# Patient Record
Sex: Female | Born: 1940 | Race: White | Hispanic: No | Marital: Married | Smoking: Never smoker
Health system: Southern US, Community
[De-identification: ages and names within clinical notes are randomized; demographics above are authoritative.]

## PROBLEM LIST (undated history)

## (undated) DIAGNOSIS — E7800 Pure hypercholesterolemia, unspecified: Secondary | ICD-10-CM

## (undated) DIAGNOSIS — F0280 Dementia in other diseases classified elsewhere without behavioral disturbance: Secondary | ICD-10-CM

## (undated) DIAGNOSIS — G43909 Migraine, unspecified, not intractable, without status migrainosus: Secondary | ICD-10-CM

## (undated) DIAGNOSIS — M810 Age-related osteoporosis without current pathological fracture: Secondary | ICD-10-CM

## (undated) DIAGNOSIS — E559 Vitamin D deficiency, unspecified: Secondary | ICD-10-CM

## (undated) DIAGNOSIS — F419 Anxiety disorder, unspecified: Secondary | ICD-10-CM

## (undated) DIAGNOSIS — K219 Gastro-esophageal reflux disease without esophagitis: Secondary | ICD-10-CM

## (undated) DIAGNOSIS — F329 Major depressive disorder, single episode, unspecified: Secondary | ICD-10-CM

## (undated) DIAGNOSIS — E222 Syndrome of inappropriate secretion of antidiuretic hormone: Secondary | ICD-10-CM

## (undated) DIAGNOSIS — E871 Hypo-osmolality and hyponatremia: Secondary | ICD-10-CM

## (undated) DIAGNOSIS — I10 Essential (primary) hypertension: Secondary | ICD-10-CM

## (undated) DIAGNOSIS — F0390 Unspecified dementia without behavioral disturbance: Secondary | ICD-10-CM

## (undated) DIAGNOSIS — F32A Depression, unspecified: Secondary | ICD-10-CM

## (undated) DIAGNOSIS — G309 Alzheimer's disease, unspecified: Secondary | ICD-10-CM

## (undated) DIAGNOSIS — G459 Transient cerebral ischemic attack, unspecified: Secondary | ICD-10-CM

## (undated) HISTORY — DX: Depression, unspecified: F32.A

## (undated) HISTORY — DX: Syndrome of inappropriate secretion of antidiuretic hormone: E22.2

## (undated) HISTORY — DX: Hypo-osmolality and hyponatremia: E87.1

## (undated) HISTORY — DX: Migraine, unspecified, not intractable, without status migrainosus: G43.909

## (undated) HISTORY — DX: Anxiety disorder, unspecified: F41.9

## (undated) HISTORY — DX: Age-related osteoporosis without current pathological fracture: M81.0

## (undated) HISTORY — DX: Gastro-esophageal reflux disease without esophagitis: K21.9

## (undated) HISTORY — DX: Major depressive disorder, single episode, unspecified: F32.9

## (undated) HISTORY — DX: Vitamin D deficiency, unspecified: E55.9

## (undated) HISTORY — DX: Pure hypercholesterolemia, unspecified: E78.00

## (undated) HISTORY — DX: Transient cerebral ischemic attack, unspecified: G45.9

## (undated) HISTORY — DX: Unspecified dementia, unspecified severity, without behavioral disturbance, psychotic disturbance, mood disturbance, and anxiety: F03.90

---

## 1998-08-25 ENCOUNTER — Other Ambulatory Visit: Admission: RE | Admit: 1998-08-25 | Discharge: 1998-08-25 | Payer: Self-pay | Admitting: Obstetrics and Gynecology

## 1999-09-29 ENCOUNTER — Other Ambulatory Visit: Admission: RE | Admit: 1999-09-29 | Discharge: 1999-09-29 | Payer: Self-pay | Admitting: Obstetrics and Gynecology

## 2000-10-03 ENCOUNTER — Other Ambulatory Visit: Admission: RE | Admit: 2000-10-03 | Discharge: 2000-10-03 | Payer: Self-pay | Admitting: Obstetrics and Gynecology

## 2000-12-29 ENCOUNTER — Encounter: Payer: Self-pay | Admitting: Internal Medicine

## 2000-12-29 ENCOUNTER — Ambulatory Visit (HOSPITAL_COMMUNITY): Admission: RE | Admit: 2000-12-29 | Discharge: 2000-12-29 | Payer: Self-pay | Admitting: Internal Medicine

## 2002-01-09 ENCOUNTER — Other Ambulatory Visit: Admission: RE | Admit: 2002-01-09 | Discharge: 2002-01-09 | Payer: Self-pay | Admitting: Obstetrics and Gynecology

## 2002-01-17 HISTORY — PX: SHOULDER SURGERY: SHX246

## 2002-03-12 ENCOUNTER — Emergency Department (HOSPITAL_COMMUNITY): Admission: EM | Admit: 2002-03-12 | Discharge: 2002-03-12 | Payer: Self-pay | Admitting: Emergency Medicine

## 2002-03-12 ENCOUNTER — Encounter: Payer: Self-pay | Admitting: Emergency Medicine

## 2002-04-02 ENCOUNTER — Encounter: Payer: Self-pay | Admitting: Neurology

## 2002-04-02 ENCOUNTER — Ambulatory Visit (HOSPITAL_COMMUNITY): Admission: RE | Admit: 2002-04-02 | Discharge: 2002-04-02 | Payer: Self-pay | Admitting: Neurology

## 2002-07-16 ENCOUNTER — Inpatient Hospital Stay (HOSPITAL_COMMUNITY): Admission: RE | Admit: 2002-07-16 | Discharge: 2002-07-18 | Payer: Self-pay | Admitting: Orthopedic Surgery

## 2002-12-09 ENCOUNTER — Emergency Department (HOSPITAL_COMMUNITY): Admission: EM | Admit: 2002-12-09 | Discharge: 2002-12-09 | Payer: Self-pay | Admitting: Emergency Medicine

## 2005-08-14 ENCOUNTER — Emergency Department (HOSPITAL_COMMUNITY): Admission: EM | Admit: 2005-08-14 | Discharge: 2005-08-14 | Payer: Self-pay | Admitting: Emergency Medicine

## 2005-11-01 ENCOUNTER — Inpatient Hospital Stay (HOSPITAL_COMMUNITY): Admission: EM | Admit: 2005-11-01 | Discharge: 2005-11-04 | Payer: Self-pay | Admitting: Emergency Medicine

## 2005-11-02 ENCOUNTER — Encounter (INDEPENDENT_AMBULATORY_CARE_PROVIDER_SITE_OTHER): Payer: Self-pay | Admitting: Interventional Cardiology

## 2007-01-05 ENCOUNTER — Emergency Department (HOSPITAL_COMMUNITY): Admission: EM | Admit: 2007-01-05 | Discharge: 2007-01-06 | Payer: Self-pay | Admitting: Emergency Medicine

## 2007-05-01 ENCOUNTER — Ambulatory Visit: Payer: Self-pay | Admitting: Internal Medicine

## 2007-05-01 DIAGNOSIS — J040 Acute laryngitis: Secondary | ICD-10-CM | POA: Insufficient documentation

## 2007-05-01 DIAGNOSIS — H6980 Other specified disorders of Eustachian tube, unspecified ear: Secondary | ICD-10-CM | POA: Insufficient documentation

## 2007-05-01 DIAGNOSIS — J309 Allergic rhinitis, unspecified: Secondary | ICD-10-CM | POA: Insufficient documentation

## 2007-05-17 DIAGNOSIS — I10 Essential (primary) hypertension: Secondary | ICD-10-CM | POA: Insufficient documentation

## 2007-05-17 DIAGNOSIS — E119 Type 2 diabetes mellitus without complications: Secondary | ICD-10-CM | POA: Insufficient documentation

## 2007-12-23 ENCOUNTER — Emergency Department (HOSPITAL_COMMUNITY): Admission: EM | Admit: 2007-12-23 | Discharge: 2007-12-23 | Payer: Self-pay | Admitting: Emergency Medicine

## 2008-01-16 ENCOUNTER — Encounter: Payer: Self-pay | Admitting: Internal Medicine

## 2008-02-06 ENCOUNTER — Encounter: Payer: Self-pay | Admitting: Internal Medicine

## 2008-02-08 ENCOUNTER — Telehealth (INDEPENDENT_AMBULATORY_CARE_PROVIDER_SITE_OTHER): Payer: Self-pay | Admitting: *Deleted

## 2008-02-21 ENCOUNTER — Ambulatory Visit: Payer: Self-pay | Admitting: Internal Medicine

## 2008-02-21 DIAGNOSIS — L299 Pruritus, unspecified: Secondary | ICD-10-CM | POA: Insufficient documentation

## 2008-02-21 LAB — CONVERTED CEMR LAB
Basophils Absolute: 0 10*3/uL (ref 0.0–0.1)
Basophils Relative: 0 % (ref 0.0–3.0)
CRP, High Sensitivity: <1 — ABNORMAL LOW (ref 0.00–5.00)
Eosinophils Absolute: 0.6 10*3/uL (ref 0.0–0.7)
Eosinophils Relative: 12.8 % — ABNORMAL HIGH (ref 0.0–5.0)
HCT: 35.2 % — ABNORMAL LOW (ref 36.0–46.0)
Hemoglobin: 12.1 g/dL (ref 12.0–15.0)
Lymphocytes Relative: 25.8 % (ref 12.0–46.0)
MCHC: 34.4 g/dL (ref 30.0–36.0)
MCV: 90.1 fL (ref 78.0–100.0)
Monocytes Absolute: 0.5 10*3/uL (ref 0.1–1.0)
Monocytes Relative: 12.1 % — ABNORMAL HIGH (ref 3.0–12.0)
Neutro Abs: 2.1 10*3/uL (ref 1.4–7.7)
Neutrophils Relative %: 49.3 % (ref 43.0–77.0)
Platelets: 254 10*3/uL (ref 150–400)
RBC: 3.91 M/uL (ref 3.87–5.11)
RDW: 12.1 % (ref 11.5–14.6)
Sed Rate: 23 mm/hr — ABNORMAL HIGH (ref 0–22)
WBC: 4.3 10*3/uL — ABNORMAL LOW (ref 4.5–10.5)

## 2008-03-12 ENCOUNTER — Encounter: Payer: Self-pay | Admitting: Internal Medicine

## 2008-03-13 ENCOUNTER — Ambulatory Visit: Payer: Self-pay | Admitting: Internal Medicine

## 2008-03-13 DIAGNOSIS — D721 Eosinophilia, unspecified: Secondary | ICD-10-CM | POA: Insufficient documentation

## 2008-03-13 LAB — CONVERTED CEMR LAB
ALT: 25 U/L
AST: 23 U/L
Albumin: 3.9 g/dL
Alkaline Phosphatase: 52 U/L
Basophils Absolute: 0.1 K/uL
Basophils Relative: 1.2 %
Bilirubin, Direct: 0.2 mg/dL
Eosinophils Absolute: 0.4 K/uL
Eosinophils Relative: 8.1 % — ABNORMAL HIGH
HCT: 35.6 % — ABNORMAL LOW
Hemoglobin: 12.3 g/dL
Lymphocytes Relative: 24 %
MCHC: 34.6 g/dL
MCV: 91.2 fL
Monocytes Absolute: 0.4 K/uL
Monocytes Relative: 8.2 %
Neutro Abs: 2.5 K/uL
Neutrophils Relative %: 58.5 %
Platelets: 216 K/uL
RBC: 3.9 M/uL
RDW: 12.4 %
Total Bilirubin: 1 mg/dL
Total Protein: 7.1 g/dL
WBC: 4.5 10*3/microliter

## 2008-03-14 ENCOUNTER — Encounter: Payer: Self-pay | Admitting: Internal Medicine

## 2008-03-14 LAB — CONVERTED CEMR LAB
Anti Nuclear Antibody(ANA): NEGATIVE
IgE (Immunoglobulin E), Serum: 2285.2 intl units/mL — ABNORMAL HIGH (ref 0.0–180.0)

## 2008-04-14 ENCOUNTER — Ambulatory Visit: Payer: Self-pay | Admitting: Internal Medicine

## 2008-10-06 ENCOUNTER — Encounter: Admission: RE | Admit: 2008-10-06 | Discharge: 2008-10-30 | Payer: Self-pay | Admitting: Internal Medicine

## 2009-08-19 ENCOUNTER — Encounter: Admission: RE | Admit: 2009-08-19 | Discharge: 2009-08-19 | Payer: Self-pay | Admitting: Internal Medicine

## 2009-10-13 ENCOUNTER — Encounter: Admission: RE | Admit: 2009-10-13 | Discharge: 2009-10-13 | Payer: Self-pay | Admitting: Internal Medicine

## 2010-02-18 NOTE — Letter (Signed)
[facility]   Imported By: Lanelle Bal 02/21/2008 08:14:58  _____________________________________________________________________  External Attachment:    Type:   Image     Comment:   External Document

## 2010-02-18 NOTE — Progress Notes (Signed)
Summary: skin allergies  Phone Note Call from Patient   Caller: Patient Call For: young Summary of Call: pt has appt to see dr young 2/4 for skin allergies, but wants to come in next week as she feels like her skin is "crawling". no rash. pt's cell [phone] Initial call taken by: Tivis Ringer,  February 08, 2008 10:50 AM  Follow-up for Phone Call        spoke with patient and apologized for her wait.  pt states that her skin still feels like it's crawling.  pt is frustrated that she has not been able to be worked in to see CY, especially since her sister was able to be seen today by him.  she states that this was never an issue "with the old office."  i apologized again for the inconvenience and told the patient that if it worsened over the weekend to go to the ER or UC, and that with the weather we already have some openings on CY's schedule for Monday.  pt denied that option due to the pending weather.  stated that she will see [place] on Thursday. Follow-up by: Boone Master CNA,  February 14, 2008 5:23 PM

## 2010-02-18 NOTE — Letter (Signed)
[facility]   Imported By: Autumn Schneider 02/21/2008 08:16:35  _____________________________________________________________________  External Attachment:    Type:   Image     Comment:   External Document

## 2010-02-18 NOTE — Assessment & Plan Note (Signed)
Summary: 1 month/cb   Primary Laverna Dossett/Referring Kaylub Detienne:  Merri Brunette  CC:  1 month followup.  Pt still c/o itching.  Also c/o sinus drainage in throat x 4 wks..  History of Present Illness: 02/21/08- Allergic rhintis, pruritus, ? sinusitis She comes now with concern of generalized itching. The only rash has been with excoriation at left axiilla, but no hives, joint inflammation, fever or sweat. Legs swell dependently. Weight has gone up. She mentions new well on her property and water has seemed "dirty"- due to be checked. Itching may have begun around time Norvasc was increased to two times a day and seemed to improve initially with dose reduction. Now off Norvasc x 5 weeks, switched to Bystolic, but she is still scratching at herself in bed at night. Denies fever, sweat, hives, adenopathy, hot joints.  Having sores in nose, yellow nasal discharge with some blood. slight headache. Not much ear discomfort.Denies chest tightness or wheeze. Using an oatmeal lotion to soothe. She uses Freight forwarder. Tried vistaril, benadryl, tranxene- make her drowsy enough to sleep through night better. Dr Michaelle Copas labs showed eosinphilia 9.10, with normal LFT.  03/13/18- Allergic rhinitis, pruritus/ rash, / sinusitis Saw Dr Katrinka Blazing, found rash under arm, sent her to Derm- had skin biospy. Also addressed insomnia with fatigue Rx'd doxepin. Did make her sleep. Skin path pending. Much less itching- medication benfit or weather warming?? Lab- Eos 9.1% by Dr Katrinka Blazing in Jan, 12.8% here, ESR 23, CRP<1. Some persistent sinus congestion, some epistaxis.Chest feels fine.No GI discomfort. Denies hx of worm/parasite infection.  04/14/08- Allergic rhinitis, pruritus/ rash, sinusitis Derm told her itching/ rash was eczema- now better except backs of legs still itch some. Has  begun noting red spots on cheeks and coments siter had rosacea. Also asks if Actos could  cause rash and tiredness. Says she needs to  slow down and that she is a light short sleeper, walking the floor at night. Goes to her primary tomorrow.  Postnasal drip sometimes blood tinged- asks for antibiotic for possible sinus infection.      Current Medications (verified): 1)  Lipitor 40 Mg  Tabs (Atorvastatin Calcium) .... Once Daily 2)  Glipizide Xl 10 Mg  Tb24 (Glipizide) .... Once Daily 3)  Protonix 40 Mg  Tbec (Pantoprazole Sodium) .... Once Daily 4)  Actos 30 Mg  Tabs (Pioglitazone Hcl) .... Once Daily 5)  Enalapril Maleate 20 Mg  Tabs (Enalapril Maleate) .... Once Daily 6)  Adult Aspirin Low Strength 81 Mg  Tbdp (Aspirin) .... Once Daily 7)  Claritin 10 Mg  Tabs (Loratadine) .... Once Daily 8)  Fosamax 70 Mg  Tabs (Alendronate Sodium) .... Every Week 9)  Caltrate 600 1500 Mg  Tabs (Calcium Carbonate) .... 2 By Mouth Once Daily 10)  Centrum Silver   Tabs (Multiple Vitamins-Minerals) .... Once Daily 11)  Bystolic 10 Mg Tabs (Nebivolol Hcl) .... Take 1 By Mouth Once Daily 12)  Doxepin Hcl 10 Mg Caps (Doxepin Hcl) .... Use As Directed  Allergies (verified): 1)  ! * Mycins  Past History:  Family History:    mother died emphysema    sister- breast ca (05/01/2007)  Social History:    Patient never smoked.     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  Past Medical History:    Reviewed history from 03/13/2008 and no changes required:    HYPERTENSION (ICD-401.9)    DIABETES, TYPE 2 (ICD-250.00)    LARYNGITIS, ACUTE (ICD-464.00)    EUSTACHIAN TUBE  DYSFUNCTION (ICD-381.81)    ALLERGIC RHINITIS (ICD-477.9)    pneumonia x 1    _Pneumonia vac x 1    Hypereosinophilia  Past Surgical History:    Reviewed history from 05/01/2007 and no changes required:    c-section    shoulder surgery  Family History:    Reviewed history from 05/01/2007 and no changes required:       mother died emphysema       sister- breast ca  Social History:    Reviewed history from 05/01/2007 and no changes required:       Patient never smoked.        married       has twin sister       works schools  Review of Systems      See HPI  The patient denies fever, weight loss, weight gain, hoarseness, syncope, peripheral edema, headaches, hemoptysis, abdominal pain, melena, hematochezia, and severe indigestion/heartburn.    Vital Signs:  Patient profile:   70 year old female Height:      59 inches Weight:      160.25 pounds BMI:     32.48 Pulse rate:   70 / minute BP sitting:   136 / 72  (left arm)  Vitals Entered By: Vernie Murders (April 14, 2008 9:45 AM)  O2 Sat on room air at rest %:  95  Physical Exam  Additional Exam:  General: A/Ox3; pleasant and cooperative, NAD,stocky build SKIN: no rash, lesions. Skin is moistened by lotion, 2 small acneiform lesions on cheks. NODES: no lymphadenopathy HEENT: [place]/AT, EOM- WNL, Conjuctivae- clear, not injected, PERRLA, TM-WNL, Nose- clear, Throat- clear and wnl, Melampatti IV, no postnasal drip visible. NECK: Supple w/ fair ROM, JVD- none, normal carotid impulses w/o bruits Thyroid- normal to palpation CHEST: Clear to P&A HEART: RRR, no m/g/r heard ABDOMEN: Soft VFI:EPPI, nl pulses, no edema  NEURO: Grossly intact to observation      Pulmonary Function Test Height (in.): 59 Gender: Female  Impression & Recommendations:  Problem # 1:  ALLERGIC RHINITIS (ICD-477.9)  Postnasal drip, question of eczema per dermatologist. Question of early rosacea. Will give the zpak she asks for. Early spring  pollen. symptoms. She sits at computer for work which may keep itiching on backs of thighs.  Her updated medication list for this problem includes:    Claritin 10 Mg Tabs (Loratadine) ..... Once daily  Complete Medication List: 1)  Lipitor 40 Mg Tabs (Atorvastatin calcium) .... Once daily 2)  Glipizide Xl 10 Mg Tb24 (Glipizide) .... Once daily 3)  Protonix 40 Mg Tbec (Pantoprazole sodium) .... Once daily 4)  Actos 30 Mg Tabs (Pioglitazone hcl) .... Once daily 5)  Enalapril Maleate 20 Mg Tabs (Enalapril maleate) .... Once daily 6)  Adult Aspirin Low Strength 81 Mg Tbdp (Aspirin) .... Once daily 7)  Claritin 10 Mg Tabs (Loratadine) .... Once  daily 8)  Fosamax 70 Mg Tabs (Alendronate sodium) .... Every week 9)  Caltrate 600 1500 Mg Tabs (Calcium carbonate) .... 2 by mouth once daily 10)  Centrum Silver Tabs (Multiple vitamins-minerals) .... Once daily 11)  Bystolic 10 Mg Tabs (Nebivolol hcl) .... Take 1 by mouth once daily 12)  Doxepin Hcl 10 Mg Caps (Doxepin hcl) .... Use as directed 13)  Zithromax Z-pak 250 Mg Tabs (Azithromycin) .... 2 today then one daily  Other Orders: Est. Patient Level II (16109)  Patient Instructions: 1)  Please schedule a follow-up appointment in 6 months. 2)  Script for Z pak at drug store 3)  Call as needed. Prescriptions: ZITHROMAX Z-PAK 250 MG TABS (AZITHROMYCIN) 2 today then one daily  #1 pak x 0   Entered and Authorized by:   Waymon Budge MD   Signed by:   Waymon Budge MD on 04/14/2008   Method used:   Electronically to        [facility]  [address] # [id number]* (retail)       [address]       [place], [place]  [address]       Ph: [phone] or [phone]       Fax: [phone]   RxID:   [id number]

## 2010-02-18 NOTE — Assessment & Plan Note (Signed)
Summary: allergy flare up/ mbw   Visit Type:  IME Initial PCP:  Merri Brunette  Chief Complaint:  ALLERGY FLARE UP-OLD PT AT [place] CHEST.  History of Present Illness: This is the first visit for a 70 year old woman self-referred for help with seasonal allergies.  She had been followed years ago at the old office.  Her twin sister is a patient here.  She complains of winter and spring nasal congestion.  She also gets postnasal drainage, sore throats, and ear pressure.  She has to sit up on pillows at night.  She has felt acutely ill with similar complaints this weekend.  She usually does not get purulent discharge or wheezing.  She has never had previous allergy vaccine.  I don't have old records to know just what workup was done years ago.     Current Allergies (reviewed today): ! * MYCINS  Past Medical History:    Reviewed history and no changes required:       pneumonia x 1       _Pneumonia vac x 1       Diabetes, Type 2       Hypertension  Past Surgical History:    Reviewed history and no changes required:       c-section       shoulder surgery   Family History:    Reviewed history and no changes required:       mother died emphysema       sister- breast ca  Social History:    Reviewed history and no changes required:       Patient never smoked.        married       has twin sister       works schools   Risk Factors:  Tobacco use:  never   Review of Systems      See HPI       indigestion, sore throats, earaches   Vital Signs:  Patient Profile:   70 Years Old Female Weight:      152.50 pounds O2 Sat:      99 % O2 treatment:    Room Air Pulse rate:   65 / minute BP sitting:   124 / 66  (left arm) Cuff size:   regular  Vitals Entered By: Reynaldo Minium CMA (May 01, 2007 3:57 PM)             Comments Medications reviewed with patient  ..................................................................Marland KitchenReynaldo Minium CMA  May 01, 2007 3:57 PM       Physical Exam  General:     normal appearance and healthy appearing.   Head:     normocephalic and atraumatic Eyes:     PERRLA/EOM intact; conjunctiva and sclera clear Ears:     TMs intact and clear with normal canals Nose:     no deformity, discharge, inflammation, or lesions Mouth:     Melampatti Class III.   porcine Neck:     no JVD.   Lungs:     chest clear to percussion and auscultation Heart:     regular rate and rhythm, S1, S2 without murmurs, rubs, gallops, or clicks Cervical Nodes:     no significant adenopathy Axillary Nodes:     no significant adenopathy     Impression & Recommendations:  Problem # 1:  ALLERGIC RHINITIS (ICD-477.9) acute exacerbation of allergic rhinitis.  I doubt that there is a significant infection now, but we have discussed treatment options.  We  were to give her inhalation treatment and cortisone shot as discussed.  This used to work quite well for her at the old office. Her updated medication list for this problem includes:    Claritin 10 Mg Tabs (Loratadine) ..... Once daily  Orders:  Admin of Therapeutic Inj  intramuscular or subcutaneous (04540) Depo- Medrol 80mg  (J1040) Nebulizer Tx (98119)   Problem # 2:  EUSTACHIAN TUBE DYSFUNCTION (ICD-381.81) mild eustachian tube dysfunction should clear with the treatment outlined above. Orders:    Problem # 3:  LARYNGITIS, ACUTE (ICD-464.00)  Orders: New Patient Level III (14782)   Medications Added to Medication List This Visit: 1)  Lipitor 40 Mg Tabs (Atorvastatin calcium) .... Once daily 2)  Glipizide Xl 10 Mg Tb24 (Glipizide) .... Once daily 3)  Protonix 40 Mg Tbec (Pantoprazole sodium) .... Once daily 4)  Actos 30 Mg Tabs (Pioglitazone hcl) .... Once daily 5)  Enalapril Maleate 20 Mg Tabs (Enalapril maleate) .... Once daily 6)  Adult Aspirin Low Strength 81 Mg Tbdp (Aspirin) .... Once daily 7)  Claritin 10 Mg Tabs (Loratadine) .... Once daily 8)  Vitamin D 95621 Unit  Caps (Ergocalciferol) .... Take 1 by mouth every week 9)  Fosamax 70 Mg Tabs (Alendronate sodium) .... Every week 10)  Caltrate 600 1500 Mg Tabs (Calcium carbonate) .... 2 by mouth once daily 11)  Centrum Silver Tabs (Multiple vitamins-minerals) .... Once daily 12)  Zithromax Z-pak 250 Mg Tabs (Azithromycin) .... 2 today then one daily   Patient Instructions: 1)  Please schedule a follow-up appointment as needed 2)  Neb neo nasal 3)  depo 80 4)  z pak    Prescriptions: ZITHROMAX Z-PAK 250 MG  TABS (AZITHROMYCIN) 2 today then one daily  #1 pak x 0   Entered and Authorized by:   Waymon Budge MD   Signed by:   Waymon Budge MD on 05/01/2007   Method used:   Electronically sent to ...       [facility]  [address] [id number]*       [address]       [place], [place]  [address]       Ph: [phone] or [phone]       Fax: [phone]   RxID:   [id number]  ]  Medication Administration  Injection # 1:    Medication: Depo- Medrol 80mg     Diagnosis: ALLERGIC RHINITIS (ICD-477.9)    Route: IM    Site: L deltoid    Exp Date: 05/2008    Lot #: [id number]    Mfr: Sicor    Given by: Reynaldo Minium CMA (May 01, 2007 4:29 PM)  Medication # 1:    Medication: EMR miscellaneous medications    Diagnosis: ALLERGIC RHINITIS (ICD-477.9)    Dose: 3 drops    Route: intranasal    Exp Date: 11/2008    Lot #: [id number]    Mfr: bayer    Comments: Neo-Synephrine    Given by: Reynaldo Minium CMA (May 01, 2007 4:29 PM)  Orders Added: 1)  New Patient Level III [99203] 2)  Admin of Therapeutic Inj  intramuscular or subcutaneous [96372] 3)  Depo- Medrol 80mg  [J1040] 4)  Nebulizer Tx [01601]

## 2010-02-18 NOTE — Assessment & Plan Note (Signed)
Summary: skin allergies///kp   Visit Type:  Follow-up PCP:  Autumn Schneider  Chief Complaint:  increased itching-getting worse; "bacteria in blood".  History of Present Illness: Current Problems:  HYPERTENSION (ICD-401.9) DIABETES, TYPE 2 (ICD-250.00) LARYNGITIS, ACUTE (ICD-464.00) EUSTACHIAN TUBE DYSFUNCTION (ICD-381.81) ALLERGIC RHINITIS (ICD-477.9)  05/01/07- This is the first visit for a 70 year old woman self-referred for help with seasonal allergies.  She had been followed years ago at the old office.  Her twin sister is a patient here.  She complains of winter and spring nasal congestion.  She also gets postnasal drainage, sore throats, and ear pressure.  She has to sit up on pillows at night.  She has felt acutely ill with similar complaints this weekend.  She usually does not get purulent discharge or wheezing.  She has never had previous allergy vaccine.  I don't have old records to know just what workup was done years ago.  02/21/08- Allergic rhintis, pruritus, ? sinusitis She comes now with concern of generalized itching. The only rash has been with excoriation at left axiilla, but no hives, joint inflammation, fever or sweat. Legs swell dependently. Weight has gone up. She mentions new well on her property and water has seemed "dirty"- due to be checked. Itching may have begun around time Norvasc was increased to two times a day and seemed to improve initially with dose reduction. Now off Norvasc x 5 weeks, switched to Bystolic, but she is still scratching at herself in bed at night. Denies fever, sweat, hives, adenopathy, hot joints.  Having sores in nose, yellow nasal discharge with some blood. slight headache. Not much ear discomfort.Denies chest tightness or wheeze. Using an oatmeal lotion to soothe. She uses Freight forwarder. Tried vistaril, benadryl, tranxene- make her drowsy enough to sleep through night better. Dr Michaelle Copas labs showed eosinphilia 9.10, with  normal LFT.         Prior Medications Reviewed Using: Patient Recall  Updated Prior Medication List: LIPITOR 40 MG  TABS (ATORVASTATIN CALCIUM) once daily GLIPIZIDE XL 10 MG  TB24 (GLIPIZIDE) once daily PROTONIX 40 MG  TBEC (PANTOPRAZOLE SODIUM) once daily ACTOS 30 MG  TABS (PIOGLITAZONE HCL) once daily ENALAPRIL MALEATE 20 MG  TABS (ENALAPRIL MALEATE) once daily ADULT ASPIRIN LOW STRENGTH 81 MG  TBDP (ASPIRIN) once daily CLARITIN 10 MG  TABS (LORATADINE) once daily FOSAMAX 70 MG  TABS (ALENDRONATE SODIUM) every week CALTRATE 600 1500 MG  TABS (CALCIUM CARBONATE) 2 by mouth once daily CENTRUM SILVER   TABS (MULTIPLE VITAMINS-MINERALS) once daily NORVASC 5 MG TABS (AMLODIPINE BESYLATE) take 1 by mouth once daily BYSTOLIC 10 MG TABS (NEBIVOLOL HCL) take 1 by mouth once daily  Current Allergies (reviewed today): ! [employer] Bible Past Medical History:    Reviewed history from 06/01/2007 and no changes required:       HYPERTENSION (ICD-401.9)       DIABETES, TYPE 2 (ICD-250.00)       LARYNGITIS, ACUTE (ICD-464.00)       EUSTACHIAN TUBE DYSFUNCTION (ICD-381.81)       ALLERGIC RHINITIS (ICD-477.9)       pneumonia x 1       _Pneumonia vac x 1                Past Surgical History:    Reviewed history from 05/01/2007 and no changes required:       c-section       shoulder surgery  Family History:    Reviewed history from 05/01/2007 and  no changes required:       mother died emphysema       sister- breast ca  Social History:    Reviewed history from 05/01/2007 and no changes required:       Patient never smoked.        married       has twin sister       works schools   Review of Systems      See HPI  The patient denies anorexia, fever, weight loss, decreased hearing, hoarseness, chest pain, abdominal pain, melena, hematochezia, hematuria, incontinence, abnormal bleeding, and enlarged lymph nodes.    Vital Signs:  Patient Profile:   70 Years Old Female Weight:       160.13 pounds O2 Sat:      98 % O2 treatment:    Room Air Pulse rate:   58 / minute BP sitting:   140 / 84  (left arm) Cuff size:   regular  Vitals Entered By: Reynaldo Minium CMA (February 21, 2008 4:09 PM)                Physical Exam  General: A/Ox3; pleasant and cooperative, NAD,stocky build SKIN: no rash, lesions. Skin is moistened by lotion  NODES: no lymphadenopathy HEENT: [place]/AT, EOM- WNL, Conjuctivae- clear, PERRLA, TM-WNL, Nose- clear, Throat- clear and wnl, Melampatti IV NECK: Supple w/ fair ROM, JVD- none, normal carotid impulses w/o bruits Thyroid- normal to palpation CHEST: Clear to P&A HEART: RRR, no m/g/r heard ABDOMEN: Soft and nl; nml bowel sounds; no organomegaly or masses noted ZOX:WRUE, nl pulses, no edema  NEURO: Grossly intact to observation      Impression & Recommendations:  Problem # 1:  PRURITUS (ICD-698.9) Nonspecific- We considered her new well. Norvasc stopped 5 weeks ago- doubt connection. Dry skin/indoor winter heat- uses bland lotion. Lisinopril can cause urticria, but simple pruritus is less obviously related. Liver function tested normal. Eosinophila may relate to an allergic process- will recheck this. Diabetic neuropathy is remotely possible. Reaction to one of her other meds seems less likely since there has not been a change.  Problem # 2:  ALLERGIC RHINITIS (ICD-477.9) This may be winter dryness more than actual infection. Will have her try Neti pot. Her updated medication list for this problem includes:    Claritin 10 Mg Tabs (Loratadine) ..... Once daily   Medications Added to Medication List This Visit: 1)  Norvasc 5 Mg Tabs (Amlodipine besylate) .... Take 1 by mouth once daily 2)  Bystolic 10 Mg Tabs (Nebivolol hcl) .... Take 1 by mouth once daily  Patient Instructions: 1)  Please schedule a follow-up appointment in 3 weeks. 2)  Try avoiding use of enszyme  based detergents and dryer sheets in laundry for next 2-3 weeks 3)  OK  to skip fosamax for 2 weeks.  4)  If no improvement will ask you to stop Lisinopril for 2-3 weeks. We can substtute a different BP med. ( We could give Benicar samples) 5)  Could ask pharmacist about very bland moisturizing lotion like Cetaphil 6)  Try Neti pot to soothe your nose.

## 2010-02-18 NOTE — Assessment & Plan Note (Signed)
Summary: 3 WEEKS/APC   Visit Type:  Follow-up PCP:  Merri Brunette  Chief Complaint:  f/up - skin rash.  History of Present Illness: Current Problems:  PRURITUS (ICD-698.9) HYPERTENSION (ICD-401.9) DIABETES, TYPE 2 (ICD-250.00) LARYNGITIS, ACUTE (ICD-464.00) EUSTACHIAN TUBE DYSFUNCTION (ICD-381.81) ALLERGIC RHINITIS (ICD-477.9) l  02/21/08- Allergic rhintis, pruritus, ? sinusitis She comes now with concern of generalized itching. The only rash has been with excoriation at left axiilla, but no hives, joint inflammation, fever or sweat. Legs swell dependently. Weight has gone up. She mentions new well on her property and water has seemed "dirty"- due to be checked. Itching may have begun around time Norvasc was increased to two times a day and seemed to improve initially with dose reduction. Now off Norvasc x 5 weeks, switched to Bystolic, but she is still scratching at herself in bed at night. Denies fever, sweat, hives, adenopathy, hot joints.  Having sores in nose, yellow nasal discharge with some blood. slight headache. Not much ear discomfort.Denies chest tightness or wheeze. Using an oatmeal lotion to soothe. She uses [employer]. Tried vistaril, benadryl, tranxene- make her drowsy enough to sleep through night better. Dr Michaelle Copas labs showed eosinphilia 9.10, with normal LFT.  03/13/18- Allergic rhinitis, pruritus/ rash, / sinusitis Saw Dr Katrinka Blazing, found rash under arm, sent her to Derm- had skin biospy. Also addressed insomnia with fatigue Rx'd doxepin. Did make her sleep. Skin path pending. Much less itching- medication benfit or weather warming?? Lab- Eos 9.1% by Dr Katrinka Blazing in Jan, 12.8% here, ESR 23, CRP<1. Some persistent sinus congestion, some epistaxis.Chest feels fine.No GI discomfort. Denies hx of worm/parasite infection.           Current Allergies: ! 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        married       has twin sister       works schools   Review of Systems      See HPI       Denies headache, chest pain, dyspnea, n/v/d,diarrhea or bloody stools, weight loss, fever, edema, adenopathy   Vital Signs:  Patient Profile:   70 Years Old Female Weight:      159.50 pounds O2 Sat:      98 % O2 treatment:    Room Air Pulse rate:   57 / minute BP sitting:   126 / 66  (left arm) Cuff size:   regular  Vitals Entered ByMarinus Maw (March 13, 2008 4:42 PM)             Comments Medications reviewed with patient Marinus Maw  March 13, 2008 4:43 PM     Physical Exam  General: A/Ox3; pleasant and cooperative, NAD,stocky build SKIN: no rash, lesions. Skin is moistened by lotion  NODES: no lymphadenopathy HEENT: [place]/AT, EOM- WNL, Conjuctivae-  clear, not injected, PERRLA, TM-WNL, Nose- clear, Throat- clear and wnl, Melampatti IV, no postnasal drip NECK: Supple w/ fair ROM, JVD- none, normal carotid impulses w/o bruits Thyroid- normal to palpation CHEST: Clear to P&A HEART: RRR, no m/g/r heard ABDOMEN: Soft and nl; nml bowel sounds; no organomegaly or masses noted MVH:QION, nl pulses, no edema  NEURO: Grossly intact to observation      Impression & Recommendations:  Problem # 1:  EOSINOPHILIA (ICD-288.3) With rhinitis, consider possible Churg Strauss vasculitis, parasite, ABPA or Fungal sinusitis. Rash/ itching seems improved. Will ask for fungal hypersensitivity check, stool for ova and parasites, ANA, RAST"RAST" (Allergy Full Profile) IGE (62952-84132) T-Stool  for O&P (44010-27253) T-Stool Giardia / Crypto- EIA (66440) "RAST" (Allergy Full Profile) IGE (34742-59563) T-Stool for O&P (87564-33295) T-Stool Giardia / Crypto- EIA (18841) "RAST" (Allergy Full Profile) IGE (66063-01601) T-Stool for O&P (09323-55732) T-Stool Giardia / Crypto- EIA (20254) "RAST" (Allergy Full Profile) IGE (27062-37628) T-Stool for O&P (31517-61607) T-Stool Giardia / Crypto- EIA (37106) "RAST" (Allergy Full Profile) IGE (26948-54627) T-Stool for O&P (03500-93818) T-Stool Giardia / Crypto- EIA (29937) "RAST" (Allergy Full Profile) IGE (16967-89381) T-Stool for O&P (01751-02585) T-Stool Giardia / Crypto- EIA (27782) "RAST" (Allergy Full Profile) IGE (42353-61443) T-Stool for O&P (15400-86761) T-Stool Giardia / Crypto- EIA (95093) "RAST" (Allergy Full Profile) IGE (26712-45809) T-Stool for O&P (98338-25053) T-Stool Giardia / Crypto- EIA (97673) "RAST" (Allergy Full Profile) IGE (41937-90240) T-Stool for O&P (97353-29924) T-Stool Giardia / Crypto- EIA (26834) "RAST" (Allergy Full Profile) IGE (19622-29798) T-Stool for O&P (92119-41740) T-Stool Giardia / Crypto- EIA (81448) "RAST" (Allergy Full Profile) IGE (18563-14970) T-Stool for O&P (26378-58850) T-Stool Giardia / Crypto- EIA (27741) "RAST" (Allergy Full Profile) IGE (28786-76720) T-Stool for O&P (94709-62836) T-Stool Giardia / Crypto- EIA (62947) "RAST" (Allergy Full Profile) IGE (65465-03546) T-Stool for O&P (56812-75170) T-Stool Giardia / Crypto- EIA (01749)   Problem # 2:  ALLERGIC RHINITIS (ICD-477.9) Wi;l watch with interest as seasonal pollens become important. Not clear that this relates to eosinophilia. Her updated medication list for this problem includes:    Claritin 10 Mg Tabs (Loratadine) ..... Once daily   Medications Added to Medication List This Visit: 1)  Doxepin Hcl 10 Mg Caps (Doxepin hcl) .... Use as directed  Patient Instructions: 1)  Please schedule a follow-up appointment in 1  month. 2)  Lab

## 2010-06-04 NOTE — H&P (Signed)
NAME:  Autumn Schneider, Autumn Schneider NO.:  [id number]   MEDICAL RECORD NO.:  [id number]          PATIENT TYPE:  EMS   LOCATION:  MAJO                         FACILITY:  [facility]   PHYSICIAN:  Lucita Ferrara, MD         DATE OF BIRTH:  28-Sep-1940   DATE OF ADMISSION:  11/01/2005  DATE OF DISCHARGE:                                HISTORY & PHYSICAL   The patient is a 70 year old female with past medical history significant  for diabetes, hypertension, osteoporosis.  Presents with chest pain to the  [facility].  The chest pain had been going on for  the last three days, located anteriorly in the left side, pressure-like in  intensity that is accompanied by fatigue.  She denies any shortness of  breath.  She denied any diaphoresis.  She did not relate it to food.  She  denied any indigestion or gastroesophageal reflux disease.  Chest pain is  not reproducible.  Chest pain does not relate to changes in position.  The  patient has been feeling out of her usual self for the last few weeks,  although this chest pain has been ongoing for only the last three days.  She  presented to her primary care doctor, Dr. Merri Brunette, who suggested that  it would be a good idea for her to get into the emergency room.  Again,  review of systems is otherwise noncontributory.  She denies any numbness,  tingling on any side of her body.  She denies any weakness.  She denied any  changes in her vision.  No GI, gastroenteritis or gastroesophageal reflux.  No shortness of breath, wheezing.  No changes in her bowel habits or bloody  stools or diarrhea, nausea or vomiting.   PAST MEDICAL HISTORY:  1. Significant for diabetes.  2. Hypertension.  3. Osteoporosis.   ALLERGIES:  1. She is allergic to ADHESIVE TAPE.  2. PENICILLIN.   PAST SURGICAL HISTORY:  None.   SOCIAL HISTORY:  She denies drugs, alcohol or tobacco abuse.   MEDICATIONS:  1. She is taking Simvastatin 20 mg p.o.  daily.  2. Enalapril 20 mg p.o. daily.  3. Protonix 40 mg p.o. daily.  4. Avandia 4 mg p.o. daily.  5. Glipizide 10 mg p.o. daily.  6. Aspirin 81 mg p.o. daily.  7. Claritin 10 mg p.o. daily.  8. Caltrate 600 mg x2.  9. Multivitamin.   PHYSICAL EXAMINATION:  GENERAL:  The patient is in no acute distress.  VITAL SIGNS:  Blood pressure 146/80, temperature 98, pulse 74, pulse 97% on  room air.  HEENT:  Normocephalic and atraumatic.  Sclerae anicteric.  NECK:  Supple.  No JVD.  No carotid bruits.  CARDIOVASCULAR:  S1-S2, regular rate and rhythm.  No murmurs, rubs, clicks.  LUNGS:  Clear to auscultation bilaterally.  No wheezes, rhonchi.  ABDOMEN:  Soft, nontender, and nondistended.  Normal bowel sounds.  No  organomegaly.  No abdominal bruits.  EXTREMITIES:  No clubbing, cyanosis, or edema.  Pulses 2+ bilaterally upper  and lower  extremities.  NEUROLOGICAL:  Alert and oriented x3.  Cranial nerves II through XII grossly  intact.  Reflexes 2+ bilaterally upper and lower extremities.  Sensation  intact and equal upper and lower extremities.   LABORATORY DATA:  Sodium 130, potassium 4, chloride 99, BUN 12.  ABG pH 7.4,  pCO2 35.2, bicarb 24.9, acid base deficit 1.  H&H 13/39.  Cardiac enzymes  were significant for troponin being elevated by 0.01 at 0.06.  The patient's  EKG shows normal sinus rhythm, no ST-T wave changes, no axis deviation, no  LVH noted.  EKG is normal.  Chest x-ray is normal.  No infiltrates.  No  pneumothorax.   ASSESSMENT/PLAN:  This is a 70 year old female with non-ST-segment elevation  myocardial infarction protocol:  Rule out myocardial infarction.  Will be  admitted to [facility] for observation and we will go ahead  and admit this patient who has an atypical history but has a lot of risk  factors including diabetes and hypertension.  Her hypertension is not well  controlled.  First of all, I think it is a good idea to discontinue her  Avandia at  4, although she may have benefited from this medication at some  point.  It is probably better for her to be off this medication at the  present time.  We will go ahead and observe her overnight.  We will admit  her to the telemetry unit.  We will continue the cardiac enzymes q.8h. x3.  We will go ahead and put an order in for Cardiolite stress test in the  morning.  We will continue her risk factor reduction including tight control  on her blood pressure.  We will continue her Simvastatin 20 mg p.o. daily  and Enalapril 20 mg daily.  I am going to go ahead and put her on a sliding  scale insulin.  The patient is currently hemodynamically stable.  I have  explained all the admission protocol and plan and hospital course to her and  she understands.      Lucita Ferrara, MD  Electronically Signed     RR/MEDQ  D:  11/01/2005  T:  11/01/2005  Job:  [id number]   cc:   Dario Guardian, M.D.

## 2010-06-04 NOTE — Discharge Summary (Signed)
Autumn Schneider, Autumn Schneider                  ACCOUNT NO.:  [id number]   MEDICAL RECORD NO.:  [id number]          PATIENT TYPE:  INP   LOCATION:  2039                         FACILITY:  [facility]   PHYSICIAN:  Kela Millin, M.D.DATE OF BIRTH:  [birth year]   DATE OF ADMISSION:  11/01/2005  DATE OF DISCHARGE:  11/04/2005                                 DISCHARGE SUMMARY   DISCHARGE DIAGNOSES:  1. Chest pain, rule out for myocardial infarction.  Stress test negative.      Likely a GI source.  2. Hyponatremia.  3. Uncontrolled diabetes mellitus.  4. Hypertension.  5. History of osteoporosis.   PROCEDURES AND STUDIES:  Stress test.  Negative for ischemia, ejection  fraction 77%.   CONSULTANTS:  Cardiology.   HISTORY:  The patient is a 70 year old white female with a past medical  history significant for diabetes, hypertension who presented with complaints  of chest pain.  She reported that she had the chest pain for 3 days located  anteriorly on the left side, pressure-like in character and accompanied by  fatigue. She denied shortness of breath and also denied diaphoresis.  She  denied any indigestion or GERD, although it was noted that the patient was  on Protonix daily prior to admission.  The chest pain was not reproducible  and did not relate to any position changes.  She also reported that she had  not been feeling like her usual self for a few weeks and after seeing her  primary care physician she was sent to the ER for further evaluation.   PHYSICAL EXAMINATION:  The physical examination upon admission as per Dr.  Flonnie Overman revealed a blood pressure of 146/80 with a temperature of 98, pulse of  74 and O2 saturation of 97%.  In general she was in no acute distress and  the rest of her physical examination was noted to be within normal limits.   LABORATORY DATA:  Her sodium was 130 with a potassium of 4, chloride of 99,  BUN of 12.  Her ABG showed a pH of 7.4, pCO2 of 35.2, bicarb of 24.9.   EKG  showed normal sinus rhythm, no ST-T wave changes.  Chest x-ray negative for  acute infiltrates.   HOSPITAL COURSE:  1. Chest pain-noncardiac per cardiology.  Upon admission the patient had      serial cardiac enzymes done and was placed on aspirin and a beta      blocker.  Her cardiac enzymes were negative for MI.  Cardiology was      consulted for risk stratification and a stress test was done on November 03, 2005 and it was negative for ischemia with an ejection fraction of      77%.  The patient's chest pain resolved while in the hospital and      cardiology saw the patient on rounds today and indicated that though      not do any further evaluation though follow as an outpatient.  It is      likely that the chest pain is  secondary to a GI etiology.  She has been      on Protonix which was maintained during her hospital stay and she will      be discharged on an increased dose of Protonix 40 b.i.d.  She is to      follow up with her primary care physician and cardiology as needed.  2. Hyponatremia-the patient's sodium was noted to be 130 on admission.      She reported while in the hospital that she had a history of      hyponatremia and been seeing Dr. Talmage Nap for this problem.  She did not      have any seizures or nausea and vomiting during her hospital stay.      Following hydration sodium improved to 132 upon discharge.  The      weakness that she reported initially is improved and she will be      discharged home at this time.  3. Diabetes mellitus-her Accu-Cheks were monitored and she was followed      with sliding scale insulin while in the hospital.  She discussed      Avandia with Dr. Eldridge Dace and he recommended to continue this upon      discharge.  4. Hyperlipidemia-the patient was maintained on Zocor during her hospital      stay.  She is to continue upon discharge.   DISCHARGE MEDICATIONS:  1. Protonix p.o. b.i.d.  2. The patient is to continue her  preadmission medications.   DISCHARGE CONDITION:  Improved-stable.   FOLLOW UP CARE:  1. Dr. Merri Brunette in 1-2 weeks.  2. Dr. Eldridge Dace as needed.      Kela Millin, M.D.  Electronically Signed     ACV/MEDQ  D:  11/04/2005  T:  11/04/2005  Job:  [id number]   cc:   Corky Crafts, MD  Dario Guardian, M.D.

## 2010-06-04 NOTE — Consult Note (Signed)
NAMELATAYVIA, Autumn Schneider NO.:  [id number]   MEDICAL RECORD NO.:  [id number]          PATIENT TYPE:  INP   LOCATION:  2039                         FACILITY:  [facility]   PHYSICIAN:  Corky Crafts, MDDATE OF BIRTH:  1940-02-01   DATE OF CONSULTATION:  DATE OF DISCHARGE:                                   CONSULTATION   REFERRING PHYSICIAN:  Dr. Donnalee Curry.   PRIMARY CARE PHYSICIAN:  Dr. Merri Brunette.   REASON FOR CONSULTATION:  Chest pain.   HISTORY OF PRESENT ILLNESS:  The patient is a 70 year old woman who has been  having chest heaviness for about two to three weeks.  She was initially  scheduled for an outpatient stress test.  Last night her symptoms became  more severe and occurred at rest.  She came to the emergency room and was  admitted to the hospital.  The discomfort she has is best described as a  heaviness, she does not state that it is worse with exertion and there is no  associated shortness of breath.  She does not have orthopnea or PND.  There  is no associated diaphoresis or syncope.  Currently, she is feeling well.  She has not had any pain for several hours.   PAST MEDICAL HISTORY:  1. Hypertension.  2. Type 2 diabetes.  3. Hypercholesterolemia.   PAST SURGICAL HISTORY:  Shoulder surgery.   ALLERGIES:  PENICILLIN.   MEDICATIONS:  1. Toprol XL 25 mg daily.  2. Vasotec 10 mg daily.  3. Zocor 20 mg daily.  4. Avandia.   SOCIAL HISTORY:  The patient does not smoke, she does not drink, she does  not use any illegal drugs.  She works for [employer].   FAMILY HISTORY:  No early coronary artery disease.   REVIEW OF SYSTEMS:  No fevers, no headaches, no chills, no nausea or  vomiting, no change in appetite, no weight loss, chest pain as described  above, no focal weakness, no rash.  All other systems are negative.   PHYSICAL EXAMINATION:  VITAL SIGNS:  Blood pressure 120/67, pulse is 65.  GENERAL:  The patient is awake and alert  in no apparent distress.  HEAD:  Normocephalic, atraumatic.  NECK:  No JVD.  CARDIOVASCULAR:  Regular rate and rhythm, S1-S2.  LUNGS:  Clear to auscultation bilaterally.  ABDOMEN:  Soft, nontender, nondistended.  EXTREMITIES:  Showed no edema, 2+ posterior tibial pulses bilaterally.  NEURO:  No focal motor or sensory deficits.  SKIN:  No rash.   Blood work shows a creatinine of .7, hematocrit of 33.1, white count of 3.9.   EKG shows normal sinus rhythm with no ischemia.   Troponin point-of-care markers:  The first one was less than .05, the second  was .06.   She has had two subsequent sets of cardiac enzymes, both of which were  negative.   ASSESSMENT:  A 70 year old with multiple risk factors for heart disease who  has had recurrent chest pain.   PLAN:  1. I agree with the stress test.  The patient may not  be able to walk on      the treadmill given her recent broken foot.  Adenosine would be      acceptable in that setting.  2. Continue aspirin, beta blockers, statin and ACE inhibitor for      preventive therapy.  3. Continue diabetes control.  4. Will follow the patient.  We have discussed the possibility of sending      the patient home for an outpatient stress test.  She preferred to have      the test done while she was in the hospital.      Corky Crafts, MD  Electronically Signed     JSV/MEDQ  D:  11/02/2005  T:  11/02/2005  Job:  [id number]

## 2010-06-04 NOTE — Op Note (Signed)
NAME:  Autumn Schneider, Autumn Schneider                              ACCOUNT NO.:  [id number]   MEDICAL RECORD NO.:  [id number]                   PATIENT TYPE:  AMB   LOCATION:  DAY                                  FACILITY:  [facility]   PHYSICIAN:  Ronald A. Darrelyn Hillock, M.D.             DATE OF BIRTH:  21-Aug-1940   DATE OF PROCEDURE:  07/16/2002  DATE OF DISCHARGE:                                 OPERATIVE REPORT   SURGEON:  Georges Lynch. Darrelyn Hillock, M.D.   ASSISTANT:  Terie Purser, P.A.   PREOPERATIVE DIAGNOSES:  1. Severe impingement syndrome, right shoulder.  2. 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   POSTOPERATIVE DIAGNOSES:  1. Severe impingement syndrome, right shoulder.  2. 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   OPERATION:  1. A partial acromionectomy and acromioplasty.  2. Repair of a torn rotator cuff tendon, a central-type tear, by utilizing a     5 x 5 double thickness graft jacket with two metal anchors.   DESCRIPTION OF PROCEDURE:  Under general anesthesia with a routine prep and  drape of the right shoulder was carried out.  The patient had 1 g of IV  Ancef preoperatively.  Following that an incision was made over the anterior  aspect of the right shoulder.  Bleeders were identified and cauterized.  At  this particular time the incision was carried down through the deltoid  tendon and muscle.  The tendon was stripped by sharp dissection from the  acromion.  I note that she had marked irregularity and thickness of her  acromion and a large central tear of her rotator cuff.  At this time I  protected the remaining cuff.  I removed the bursa.  I then went on and  protected the cuff with the Bennett retractor, and utilized the oscillating  saw and did a partial acromionectomy.  I then utilized the bur to even out  the bone end.  Following this I bone waxed the raw bone and the hip bone  end.  At this time I then identified the large central tear.  I opened  the  tear up somewhat in order to get up underneath the tendon to bur down the  lateral articular surface of the humerus.  Following that I then utilized a  double thickness graft jacket and sutured that in place proximally,  medially, laterally and distally.  I utilized two multi-tack sutures into  the bone and sutured the tendon back down into the bone.  I thoroughly  irrigated out the area and reapproximated the deltoid tendon and muscle in  the usual fashion.  The subcutaneous was closed with #0 Vicryl, the skin  with metal staples, and a sterile Neosporin dressing was applied.  The  patient was placed in a shoulder immobilizer.  Ronald A. Darrelyn Hillock, M.D.    RAG/MEDQ  D:  07/16/2002  T:  07/16/2002  Job:  [id number]

## 2010-06-04 NOTE — H&P (Signed)
Autumn Schneider, MCMURRY NO.:  [id number]   MEDICAL RECORD NO.:  [id number]          PATIENT TYPE:  INP   LOCATION:  2039                         FACILITY:  [facility]   PHYSICIAN:  Lucita Ferrara, MD         DATE OF BIRTH:  April 05, 1940   DATE OF ADMISSION:  11/01/2005  DATE OF DISCHARGE:                                HISTORY & PHYSICAL   HISTORY OF PRESENT ILLNESS:  The patient is a 70 year old female who  presents to [facility] with left-sided chest pain  intermittently for 3 days. The pain is heavy-like and accompanied by some  shortness of breath. Not related to indigestion or food. Not related to  position. Chest pain is accompanied by fatigue, again no shortness of  breath.  No neurological deficits, no numbness, tingling. The patient went  to her primary care physician today (Dr. Merri Brunette) who advised her that  she should be admitted to the hospital.   PAST MEDICAL HISTORY:  1. Significant for diabetes.  2. Hypertension.  3. Osteoporosis.   SOCIAL HISTORY:  She is a non drinker, non smoker. Denies drug abuse.   MEDICATIONS:  1. Simvastatin 20 mg p.o. daily.  2. Enalapril 20 mg p.o. daily.  3. Protonix 40 mg p.o. daily.  4. Avandia 4 mg p.o. daily.  5. Glipizide 10 mg p.o. daily.  6. Aspirin 81 mg p.o. daily.  7. Claritin 10 mg p.o. daily.  8. Caltrate 600 b.i.d.  9. Multivitamin.   PAST SURGICAL HISTORY:  Denies.   FAMILY HISTORY:  Noncontributory.   ALLERGIES:  PENICILLIN AND ADHESIVE TAPE.   PHYSICAL EXAMINATION:  GENERAL:  The patient is in no acute distress.  VITAL SIGNS:  Blood pressure is 136/80, pulse 72, respirations 20, pulse  oximetry 97% on room air.  HEENT:  Normocephalic, atraumatic. Sclerae anicteric. Mucous membranes  moist.  NECK:  No JVD, no carotid bruits.  HEART:  S1, S2 regular rate and rhythm, no murmurs, rubs, clicks.  LUNGS:  Clear to auscultation bilaterally, no wheezes.  ABDOMEN:  Soft, nontender,  nondistended, positive bowel sounds.  NEUROLOGIC:  Cranial nerves II-XII grossly intact. Sensation intact in upper  and lower extremities. Reflexes 2+ bilaterally upper and lower extremities.  EXTREMITIES:  No clubbing, cyanosis or edema.  Pulses 2+ bilaterally.   LABORATORY DATA:  Sodium 130, potassium 4.0, chloride 99, BUN 12, glucose  86. ABG: pH 7.4, PCO2 32, bicarb 24.9, excess base deficit 1. H&H 13.3/39.  First set of cardiac enzymes: Troponin 0.06, elevated 5.01.   EKG shows normal sinus rhythm, no ST, T wave changes, no axis deviation. No  left ventricular hypertrophy. Again, patient was started on Lovenox  subcutaneously and intravenous hydration in the emergency room.   ASSESSMENT AND PLAN:  The patient is a 70 year old female with past medical  history of diabetes, hypertension and osteoporosis. She presents here with  chest pain that is atypical but patient has too many risk factors, including  uncontrolled hypertension and diabetes. Will go ahead and admit her for  chest pain and  for non ST elevated myocardial infarction protocol. The  patient will be started on Lovenox SQ at a therapeutic dose.      Lucita Ferrara, MD  Electronically Signed     RR/MEDQ  D:  11/01/2005  T:  11/01/2005  Job:  [id number]

## 2010-12-13 ENCOUNTER — Other Ambulatory Visit: Payer: Self-pay | Admitting: Family Medicine

## 2010-12-13 DIAGNOSIS — R413 Other amnesia: Secondary | ICD-10-CM

## 2010-12-15 ENCOUNTER — Ambulatory Visit
Admission: RE | Admit: 2010-12-15 | Discharge: 2010-12-15 | Disposition: A | Discharge status: Home / Self Care | Payer: Medicare Other | Source: Ambulatory Visit | Attending: Family Medicine | Admitting: Family Medicine

## 2010-12-15 DIAGNOSIS — R413 Other amnesia: Secondary | ICD-10-CM

## 2011-03-07 ENCOUNTER — Emergency Department (HOSPITAL_COMMUNITY): Payer: Medicare Other

## 2011-03-07 ENCOUNTER — Other Ambulatory Visit: Payer: Self-pay

## 2011-03-07 ENCOUNTER — Encounter (HOSPITAL_COMMUNITY): Payer: Self-pay | Admitting: *Deleted

## 2011-03-07 ENCOUNTER — Inpatient Hospital Stay (HOSPITAL_COMMUNITY)
Admission: EM | Admit: 2011-03-07 | Discharge: 2011-03-10 | DRG: 069 | Disposition: A | Discharge status: Home / Self Care | Payer: Medicare Other | Source: Ambulatory Visit | Attending: Internal Medicine | Admitting: Internal Medicine

## 2011-03-07 DIAGNOSIS — F0280 Dementia in other diseases classified elsewhere without behavioral disturbance: Secondary | ICD-10-CM | POA: Diagnosis present

## 2011-03-07 DIAGNOSIS — G459 Transient cerebral ischemic attack, unspecified: Principal | ICD-10-CM | POA: Diagnosis present

## 2011-03-07 DIAGNOSIS — I951 Orthostatic hypotension: Secondary | ICD-10-CM | POA: Diagnosis present

## 2011-03-07 DIAGNOSIS — I498 Other specified cardiac arrhythmias: Secondary | ICD-10-CM | POA: Diagnosis present

## 2011-03-07 DIAGNOSIS — I10 Essential (primary) hypertension: Secondary | ICD-10-CM | POA: Diagnosis present

## 2011-03-07 DIAGNOSIS — G309 Alzheimer's disease, unspecified: Secondary | ICD-10-CM | POA: Diagnosis present

## 2011-03-07 DIAGNOSIS — E119 Type 2 diabetes mellitus without complications: Secondary | ICD-10-CM | POA: Diagnosis present

## 2011-03-07 DIAGNOSIS — E785 Hyperlipidemia, unspecified: Secondary | ICD-10-CM | POA: Diagnosis present

## 2011-03-07 DIAGNOSIS — E871 Hypo-osmolality and hyponatremia: Secondary | ICD-10-CM | POA: Diagnosis present

## 2011-03-07 DIAGNOSIS — E1169 Type 2 diabetes mellitus with other specified complication: Secondary | ICD-10-CM | POA: Diagnosis present

## 2011-03-07 DIAGNOSIS — K219 Gastro-esophageal reflux disease without esophagitis: Secondary | ICD-10-CM | POA: Diagnosis present

## 2011-03-07 HISTORY — DX: Alzheimer's disease, unspecified: G30.9

## 2011-03-07 HISTORY — DX: Dementia in other diseases classified elsewhere, unspecified severity, without behavioral disturbance, psychotic disturbance, mood disturbance, and anxiety: F02.80

## 2011-03-07 HISTORY — DX: Essential (primary) hypertension: I10

## 2011-03-07 LAB — BASIC METABOLIC PANEL
BUN: 11 mg/dL (ref 6–23)
BUN: 11 mg/dL (ref 6–23)
CO2: 28 mEq/L (ref 19–32)
CO2: 28 mEq/L (ref 19–32)
Calcium: 9.9 mg/dL (ref 8.4–10.5)
Calcium: 9.4 mg/dL (ref 8.4–10.5)
Chloride: 91 mEq/L — ABNORMAL LOW (ref 96–112)
Chloride: 89 mEq/L — ABNORMAL LOW (ref 96–112)
Creatinine, Ser: 0.56 mg/dL (ref 0.50–1.10)
Creatinine, Ser: 0.53 mg/dL (ref 0.50–1.10)
GFR calc Af Amer: >90 mL/min (ref >90)
GFR calc Af Amer: >90 mL/min (ref >90)
GFR calc non Af Amer: >90 mL/min (ref >90)
GFR calc non Af Amer: >90 mL/min (ref >90)
Glucose, Bld: 159 mg/dL — ABNORMAL HIGH (ref 70–99)
Glucose, Bld: 137 mg/dL — ABNORMAL HIGH (ref 70–99)
Potassium: 5.7 mEq/L — ABNORMAL HIGH (ref 3.5–5.1)
Potassium: 4.6 mEq/L (ref 3.5–5.1)
Sodium: 126 mEq/L — ABNORMAL LOW (ref 135–145)
Sodium: 124 mEq/L — ABNORMAL LOW (ref 135–145)

## 2011-03-07 LAB — CBC
HCT: 36.2 % (ref 36.0–46.0)
Hemoglobin: 12.8 g/dL (ref 12.0–15.0)
MCH: 30.8 pg (ref 26.0–34.0)
MCHC: 35.4 g/dL (ref 30.0–36.0)
MCV: 87.2 fL (ref 78.0–100.0)
Platelets: 242 10*3/uL (ref 150–400)
RBC: 4.15 MIL/uL (ref 3.87–5.11)
RDW: 12.1 % (ref 11.5–15.5)
WBC: 6.4 10*3/uL (ref 4.0–10.5)

## 2011-03-07 LAB — URINALYSIS, ROUTINE W REFLEX MICROSCOPIC
Bilirubin Urine: NEGATIVE
Glucose, UA: 100 mg/dL — AB
Ketones, ur: NEGATIVE mg/dL
Leukocytes, UA: NEGATIVE
Nitrite: NEGATIVE
Protein, ur: NEGATIVE mg/dL
Specific Gravity, Urine: 1.013 (ref 1.005–1.030)
Urobilinogen, UA: 0.2 mg/dL (ref 0.0–1.0)
pH: 6.5 (ref 5.0–8.0)

## 2011-03-07 LAB — URINE MICROSCOPIC-ADD ON

## 2011-03-07 LAB — CARDIAC PANEL(CRET KIN+CKTOT+MB+TROPI)
CK, MB: 5 ng/mL — ABNORMAL HIGH (ref 0.3–4.0)
Relative Index: 1.6 (ref 0.0–2.5)
Total CK: 317 U/L — ABNORMAL HIGH (ref 7–177)
Troponin I: <0.3 ng/mL (ref <0.30)

## 2011-03-07 LAB — GLUCOSE, CAPILLARY
Glucose-Capillary: 138 mg/dL — ABNORMAL HIGH (ref 70–99)
Glucose-Capillary: 157 mg/dL — ABNORMAL HIGH (ref 70–99)
Glucose-Capillary: 64 mg/dL — ABNORMAL LOW (ref 70–99)

## 2011-03-07 LAB — CREATININE, URINE, RANDOM: Creatinine, Urine: 25.37 mg/dL

## 2011-03-07 LAB — TROPONIN I: Troponin I: <0.3 ng/mL (ref <0.30)

## 2011-03-07 LAB — TSH: TSH: 1.144 u[IU]/mL (ref 0.350–4.500)

## 2011-03-07 LAB — SODIUM, URINE, RANDOM: Sodium, Ur: 52 mEq/L

## 2011-03-07 MED ORDER — ASPIRIN 325 MG PO TABS
325.0000 mg | ORAL_TABLET | Freq: Every day | ORAL | Status: DC
  Administered 2011-03-08 – 2011-03-10 (×3): 325 mg via ORAL
  Filled 2011-03-07 (×3): qty 1

## 2011-03-07 MED ORDER — PANTOPRAZOLE SODIUM 40 MG PO TBEC
40.0000 mg | DELAYED_RELEASE_TABLET | Freq: Every day | ORAL | Status: DC
  Administered 2011-03-08 – 2011-03-10 (×3): 40 mg via ORAL
  Filled 2011-03-07 (×3): qty 1

## 2011-03-07 MED ORDER — SODIUM CHLORIDE 0.9 % IV SOLN
INTRAVENOUS | Status: AC
  Administered 2011-03-07: 23:00:00 via INTRAVENOUS

## 2011-03-07 MED ORDER — INSULIN ASPART 100 UNIT/ML ~~LOC~~ SOLN
0.0000 [IU] | SUBCUTANEOUS | Status: DC
  Administered 2011-03-08: 2 [IU] via SUBCUTANEOUS
  Filled 2011-03-07: qty 3

## 2011-03-07 MED ORDER — CLORAZEPATE DIPOTASSIUM 7.5 MG PO TABS
7.5000 mg | ORAL_TABLET | Freq: Two times a day (BID) | ORAL | Status: DC | PRN
Start: 2011-03-07 — End: 2011-03-10

## 2011-03-07 MED ORDER — LORATADINE 10 MG PO TABS
10.0000 mg | ORAL_TABLET | Freq: Every day | ORAL | Status: DC
  Administered 2011-03-08 – 2011-03-10 (×3): 10 mg via ORAL
  Filled 2011-03-07 (×3): qty 1

## 2011-03-07 MED ORDER — DONEPEZIL HCL 5 MG PO TABS
5.0000 mg | ORAL_TABLET | Freq: Every day | ORAL | Status: DC
  Administered 2011-03-07 – 2011-03-08 (×2): 5 mg via ORAL
  Filled 2011-03-07 (×3): qty 1

## 2011-03-07 MED ORDER — ACETAMINOPHEN 325 MG PO TABS
650.0000 mg | ORAL_TABLET | ORAL | Status: DC | PRN
  Administered 2011-03-09: 650 mg via ORAL
  Filled 2011-03-07: qty 2

## 2011-03-07 MED ORDER — NEBIVOLOL HCL 10 MG PO TABS
10.0000 mg | ORAL_TABLET | Freq: Every day | ORAL | Status: DC
  Filled 2011-03-07: qty 1

## 2011-03-07 MED ORDER — SODIUM CHLORIDE 0.9 % IV SOLN
Freq: Once | INTRAVENOUS | Status: AC
  Administered 2011-03-07: 17:00:00 via INTRAVENOUS

## 2011-03-07 MED ORDER — SODIUM CHLORIDE 1 G PO TABS
1.0000 g | ORAL_TABLET | Freq: Two times a day (BID) | ORAL | Status: DC
  Administered 2011-03-07 – 2011-03-10 (×6): 1 g via ORAL
  Filled 2011-03-07 (×7): qty 1

## 2011-03-07 MED ORDER — ROSUVASTATIN CALCIUM 20 MG PO TABS
20.0000 mg | ORAL_TABLET | Freq: Every day | ORAL | Status: DC
  Administered 2011-03-07 – 2011-03-09 (×3): 20 mg via ORAL
  Filled 2011-03-07 (×4): qty 1

## 2011-03-07 NOTE — ED Provider Notes (Signed)
Results for orders placed during the hospital encounter of 03/07/11  TROPONIN I      Component Value Range   Troponin I <0.30  <0.30 (ng/mL)  CBC      Component Value Range   WBC 6.4  4.0 - 10.5 (K/uL)   RBC 4.15  3.87 - 5.11 (MIL/uL)   Hemoglobin 12.8  12.0 - 15.0 (g/dL)   HCT 95.6  21.3 - 08.6 (%)   MCV 87.2  78.0 - 100.0 (fL)   MCH 30.8  26.0 - 34.0 (pg)   MCHC 35.4  30.0 - 36.0 (g/dL)   RDW 57.8  46.9 - 62.9 (%)   Platelets 242  150 - 400 (K/uL)  BASIC METABOLIC PANEL      Component Value Range   Sodium 126 (*) 135 - 145 (mEq/L)   Potassium 5.7 (*) 3.5 - 5.1 (mEq/L)   Chloride 91 (*) 96 - 112 (mEq/L)   CO2 28  19 - 32 (mEq/L)   Glucose, Bld 159 (*) 70 - 99 (mg/dL)   BUN 11  6 - 23 (mg/dL)   Creatinine, Ser 5.28  0.50 - 1.10 (mg/dL)   Calcium 9.9  8.4 - 41.3 (mg/dL)   GFR calc non Af Amer >90  >90 (mL/min)   GFR calc Af Amer >90  >90 (mL/min)  URINALYSIS, ROUTINE W REFLEX MICROSCOPIC      Component Value Range   Color, Urine YELLOW  YELLOW    APPearance CLEAR  CLEAR    Specific Gravity, Urine 1.013  1.005 - 1.030    pH 6.5  5.0 - 8.0    Glucose, UA 100 (*) NEGATIVE (mg/dL)   Hgb urine dipstick TRACE (*) NEGATIVE    Bilirubin Urine NEGATIVE  NEGATIVE    Ketones, ur NEGATIVE  NEGATIVE (mg/dL)   Protein, ur NEGATIVE  NEGATIVE (mg/dL)   Urobilinogen, UA 0.2  0.0 - 1.0 (mg/dL)   Nitrite NEGATIVE  NEGATIVE    Leukocytes, UA NEGATIVE  NEGATIVE   TSH      Component Value Range   TSH 1.144  0.350 - 4.500 (uIU/mL)  GLUCOSE, CAPILLARY      Component Value Range   Glucose-Capillary 157 (*) 70 - 99 (mg/dL)  BASIC METABOLIC PANEL      Component Value Range   Sodium 124 (*) 135 - 145 (mEq/L)   Potassium 4.6  3.5 - 5.1 (mEq/L)   Chloride 89 (*) 96 - 112 (mEq/L)   CO2 28  19 - 32 (mEq/L)   Glucose, Bld 137 (*) 70 - 99 (mg/dL)   BUN 11  6 - 23 (mg/dL)   Creatinine, Ser 2.44  0.50 - 1.10 (mg/dL)   Calcium 9.4  8.4 - 01.0 (mg/dL)   GFR calc non Af Amer >90  >90 (mL/min)   GFR  calc Af Amer >90  >90 (mL/min)  URINE MICROSCOPIC-ADD ON      Component Value Range   Squamous Epithelial / LPF RARE  RARE    WBC, UA 0-2  <3 (WBC/hpf)   RBC / HPF 0-2  <3 (RBC/hpf)  MDM:  Care transferred from Dr. Margorie John.  71 y.o. F with possible TIA vs. Hypoglycemic episode with initial exam only remarkable for possible decreased LLE sensation to light touch.  Initial labs with hyperkalemia; awaiting repeat.  CT head pending.  Plan to admit pt to medicine.  CT head without acute path.  Repeat potassium WNL.  Pt admitted to triad for further evaluation.  Clinical picture: 1.  Slurred speech 2.  Possible hypoglycemic episode 3.  Possible transient ischemic attack   Particia Lather, MD 03/08/11 0020

## 2011-03-07 NOTE — ED Notes (Signed)
Pt to xray with tech

## 2011-03-07 NOTE — ED Provider Notes (Addendum)
History     CSN: [id number]  Arrival date & time 03/07/11  1318   First MD Initiated Contact with Patient 03/07/11 1321      Chief Complaint  Patient presents with  . Dizziness  . Extremity Weakness    left side    (Consider location/radiation/quality/duration/timing/severity/associated sxs/prior treatment) HPI  71 year old woman with recent diagnosis of Alzheimer's disease who woke up this morning and was unable to support her own weight when she tried to get out of bed and sunk down to her knees. She reports not knowing where she was and also feeling dizzy. She also felt that her left arm was weak. Her husband reports that when he came in the room when he heard her shout, her speech was slurred though he could understand her. He gave her some pepsi and a banana and later some orange juice and her confusion and slurred speech seemed to improve quite a bit in the next 5-10 minutes. She regained her strength and was able to walk within the next hour. He also reports that she has had 2 or more episodes in the past 10 years where she had similar symptoms and EMS was called and it resolved with giving her some sugar. They did not check her sugar at home, but it was 140 when checked via EMS. 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 Her son reports that she has had decreased appetite for the past 8-9 months. She also reports several episodes of left sided chest pain over the past weeks that may be exertional in nature.   Past Medical History  Diagnosis Date  . Diabetes mellitus   . Alzheimer disease     diagnosed 12/12    Past Surgical History  Procedure Date  . Cesarean section     No family history on file.  History  Substance Use Topics  . Smoking status: Never Smoker   . Smokeless tobacco: Not on file  . Alcohol Use: No    OB History    Grav Para Term Preterm Abortions TAB SAB Ect Mult Living                  Review of Systems  Constitutional: Negative for fever, chills and unexpected weight change.  Respiratory: Negative for shortness of breath.   Cardiovascular: Positive for chest pain.  Gastrointestinal: Negative for abdominal pain and diarrhea.  Neurological: Positive for speech difficulty and weakness. Negative for dizziness, numbness and headaches.    Allergies  Erythromycin  Home Medications   Current Outpatient Rx  Name Route Sig Dispense Refill  . ASPIRIN EC 81 MG PO TBEC Oral Take 81 mg by mouth daily.    Marland Kitchen CALCIUM CARBONATE-VITAMIN D 500-200 MG-UNIT PO TABS Oral Take 1 tablet by mouth 2 (two) times daily.    Marland Kitchen VITAMIN D 1000 UNITS PO TABS Oral Take 1,000 Units by mouth daily.    Marland Kitchen CLORAZEPATE DIPOTASSIUM 7.5 MG PO TABS Oral Take 7.5 mg by mouth 2 (two) times daily as needed. For anxiety    . DONEPEZIL HCL 5 MG PO TABS Oral Take 5 mg by mouth at bedtime.    Marland Kitchen GLIPIZIDE ER 10 MG PO TB24 Oral Take 10 mg by mouth 2 (two) times daily.    Marland Kitchen LORATADINE 10 MG PO TABS Oral Take 10 mg by mouth daily.    . NEBIVOLOL HCL  10 MG PO TABS Oral Take 10 mg by mouth daily.    Marland Kitchen PANTOPRAZOLE SODIUM 40 MG PO TBEC Oral Take 40 mg by mouth daily.    Marland Kitchen ROSUVASTATIN CALCIUM 20 MG PO TABS Oral Take 20 mg by mouth at bedtime.    . SODIUM CHLORIDE 1 G PO TABS Oral Take 1 g by mouth 2 (two) times daily.      BP 158/69  Pulse 57  Temp(Src) 98.2 F (36.8 C) (Oral)  Resp 16  SpO2 99%  Physical Exam General: pleasant elderly woman resting in bed in no apparent distress HEENT: PERRL, EOMI, no scleral icterus. Patient seems to lose finger in left lower visual field. Cardiac: RRR, no rubs, murmurs or gallops Pulm: clear to auscultation bilaterally, moving normal volumes of air Abd: soft, nontender, nondistended, BS present Ext: warm and well perfused, no pedal edema Neuro: alert and oriented X3, cranial nerves II-XII grossly intact. Equal strength bilaterally in upper and lower  extremities. Patient reports decreased sensation to light touch in left lower extremity.   ED Course  Procedures (including critical care time)   Labs Reviewed  TROPONIN I  CBC  BASIC METABOLIC PANEL  URINALYSIS, ROUTINE W REFLEX MICROSCOPIC  TSH   No results found.   No diagnosis found.  Date: 03/07/2011  Rate:57  Rhythm: normal sinus rhythm  QRS Axis: normal  Intervals: PR prolonged  ST/T Wave abnormalities: normal  Conduction Disutrbances:none  Narrative Interpretation:   Old EKG Reviewed: changes noted  MDM  Will obtain Head CT and evaluate for cardiac pathology and acute infection with CXR and urinalysis and admit to medicine for TIA rule out.     Margorie John, MD 03/07/11 1453  Margorie John, MD 03/26/11 2140

## 2011-03-07 NOTE — ED Notes (Signed)
Pt went to sleep at 2100 last night without complaint. Woke at 1100 this morning with left sided weakness, dizziness and slurred speech. Pt felt weak and fell to knees without injury or LOC. Reports h/o same, resolved by eating. cbg today was 140 enroute and pt c/o continued dizziness and left sided weakness in upper extremity. Speech has resolved since eating.

## 2011-03-07 NOTE — ED Notes (Signed)
Pt returns from xray

## 2011-03-07 NOTE — ED Notes (Signed)
Repeat cbg 154

## 2011-03-07 NOTE — ED Notes (Signed)
Resident at bedside for eval.

## 2011-03-07 NOTE — ED Notes (Signed)
Consulting MD at bedside

## 2011-03-07 NOTE — ED Notes (Signed)
3028-01 Ready 

## 2011-03-07 NOTE — H&P (Signed)
PCP:  Merri Brunette with [facility]   Chief Complaint:   weakness  HPI: Autumn Schneider is a 71 y.o. female   has a past medical history of Diabetes mellitus and Alzheimer disease.   Presented with  At 11:25am her husband heard her being on the floor. Patient was moaning with slurred speech, she had  weakness in both legs, family thought this was because she did not have enough food intake. Symptoms improved with banana and juice. BG was not checked. BP was 193/85. No droop. Family denyes localized weakness but did have Left arm numbness for about 1 hour now resolved. She had a number of prior episodes in the past all attributed to not eating well.  Review of Systems:    Pertinent positives include: dizziness,  Constitutional:  No weight loss, night sweats, Fevers, chills, fatigue.  HEENT:  No headaches, Difficulty swallowing,Tooth/dental problems,Sore throat,  No sneezing, itching, ear ache, nasal congestion, post nasal drip,  Cardio-vascular:  No chest pain, Orthopnea, PND, anasarca, palpitations.no Bilateral lower extremity swelling  GI:  No heartburn, indigestion, abdominal pain, nausea, vomiting, diarrhea, change in bowel habits, loss of appetite, melena, blood in stool, hematoemesis Resp:  no shortness of breath at rest. No dyspnea on exertion, No excess mucus, no productive cough, No non-productive cough, No coughing up of blood.No change in color of mucus.No wheezing.No chest wall deformity  Skin:  no rash or lesions.  GU:  no dysuria, change in color of urine, no urgency or frequency. No flank pain.  Musculoskeletal:  No joint pain or swelling. No decreased range of motion. No back pain.  Psych:  No change in mood or affect. No depression or anxiety. No memory loss.  Neuro: no localizing neurological complaints, no tingling, no weakness, no double vision, no gait abnormality, no slurred speech   Otherwise ROS are negative except for above, 10 systems were reviewed  Past  Medical History: Past Medical History  Diagnosis Date  . Diabetes mellitus   . Alzheimer disease     diagnosed 12/12   Past Surgical History  Procedure Date  . Cesarean section      Medications: Prior to Admission medications   Medication Sig Start Date End Date Taking? Authorizing Provider  aspirin EC 81 MG tablet Take 81 mg by mouth daily.   Yes Historical Provider, MD  calcium-vitamin D (OSCAL WITH D) 500-200 MG-UNIT per tablet Take 1 tablet by mouth 2 (two) times daily.   Yes Historical Provider, MD  cholecalciferol (VITAMIN D) 1000 UNITS tablet Take 1,000 Units by mouth daily.   Yes Historical Provider, MD  clorazepate (TRANXENE) 7.5 MG tablet Take 7.5 mg by mouth 2 (two) times daily as needed. For anxiety   Yes Historical Provider, MD  donepezil (ARICEPT) 5 MG tablet Take 5 mg by mouth at bedtime.   Yes Historical Provider, MD  glipiZIDE (GLUCOTROL XL) 10 MG 24 hr tablet Take 10 mg by mouth 2 (two) times daily.   Yes Historical Provider, MD  loratadine (CLARITIN) 10 MG tablet Take 10 mg by mouth daily.   Yes Historical Provider, MD  nebivolol (BYSTOLIC) 10 MG tablet Take 10 mg by mouth daily.   Yes Historical Provider, MD  pantoprazole (PROTONIX) 40 MG tablet Take 40 mg by mouth daily.   Yes Historical Provider, MD  rosuvastatin (CRESTOR) 20 MG tablet Take 20 mg by mouth at bedtime.   Yes Historical Provider, MD  sodium chloride 1 G tablet Take 1 g by mouth 2 (two) times  daily.   Yes Historical Provider, MD    Allergies:   Allergies  Allergen Reactions  . Erythromycin Hives    Social History:  Ambulatory independently Lives at home with husband and son   reports that she has never smoked. She does not have any smokeless tobacco history on file. She reports that she does not drink alcohol or use illicit drugs.   Family History: family history includes Breast cancer in her sister; Diabetes in her sister; and Stroke in her mother.    Physical Exam: Patient Vitals  for the past 24 hrs:  BP Temp Temp src Pulse Resp SpO2  03/07/11 1900 - - - 58  16  98 %  03/07/11 1859 154/66 mmHg 98.2 F (36.8 C) Oral 54  18  99 %  03/07/11 1845 - - - 60  21  97 %  03/07/11 1830 - - - 58  19  97 %  03/07/11 1815 - - - 59  18  97 %  03/07/11 1715 - - - 52  13  98 %  03/07/11 1700 - - - 53  17  98 %  03/07/11 1645 108/52 mmHg - - 53  16  98 %  03/07/11 1633 162/62 mmHg 98.1 F (36.7 C) Oral 57  18  97 %  03/07/11 1630 162/62 mmHg - - 55  16  98 %  03/07/11 1615 162/56 mmHg - - 55  11  99 %  03/07/11 1600 131/92 mmHg - - 55  14  98 %  03/07/11 1545 152/64 mmHg - - 54  14  99 %  03/07/11 1530 162/60 mmHg - - 57  16  99 %  03/07/11 1520 160/60 mmHg - - 65  16  97 %  03/07/11 1515 160/60 mmHg - - 55  14  98 %  03/07/11 1500 153/66 mmHg - - 56  14  99 %  03/07/11 1456 188/69 mmHg 98.2 F (36.8 C) Oral 60  15  100 %  03/07/11 1430 149/86 mmHg - - 55  15  100 %  03/07/11 1415 161/58 mmHg - - 55  15  100 %  03/07/11 1400 171/63 mmHg - - 56  14  100 %  03/07/11 1348 158/69 mmHg 98.2 F (36.8 C) Oral 57  16  99 %  03/07/11 1345 158/69 mmHg - - 57  16  98 %  03/07/11 1330 181/61 mmHg - - 55  18  100 %  03/07/11 1313 196/64 mmHg 97.5 F (36.4 C) Oral 58  15  99 %    1. General:  in No Acute distress 2. Psychological: Alert and Oriented 3. Head/ENT:  Dry Mucous Membranes                          Head Non traumatic, neck supple                          Normal  Dentition 4. SKIN: normal Skin turgor,  Skin clean Dry and intact no rash 5. Heart: Regular rate and rhythm no Murmur, Rub or gallop 6. Lungs: Clear to auscultation bilaterally, no wheezes or crackles   7. Abdomen: Soft, non-tender, Non distended 8. Lower extremities: no clubbing, cyanosis, or edema 9. Neurologically moving all 4 extremities equally, strength 5/5 in all four ext, Cn 2-12 intact 10. MSK: Normal range of motion  body mass index is unknown because  there is no height or weight on file.   Labs  on Admission:   [facility] 03/07/11 1538 03/07/11 1411  NA 124* 126*  K 4.6 5.7*  CL 89* 91*  CO2 28 28  GLUCOSE 137* 159*  BUN 11 11  CREATININE 0.53 0.56  CALCIUM 9.4 9.9  MG -- --  PHOS -- --   No results found for this basename: AST:2,ALT:2,ALKPHOS:2,BILITOT:2,PROT:2,ALBUMIN:2 in the last 72 hours No results found for this basename: LIPASE:2,AMYLASE:2 in the last 72 hours  Basename 03/07/11 1411  WBC 6.4  NEUTROABS --  HGB 12.8  HCT 36.2  MCV 87.2  PLT 242    Basename 03/07/11 1411  CKTOTAL --  CKMB --  CKMBINDEX --  TROPONINI <0.30   No results found for this basename: TSH,T4TOTAL,FREET3,T3FREE,THYROIDAB in the last 72 hours No results found for this basename: VITAMINB12:2,FOLATE:2,FERRITIN:2,TIBC:2,IRON:2,RETICCTPCT:2 in the last 72 hours No results found for this basename: HGBA1C    The CrCl is unknown because both a height and weight (above a minimum accepted value) are required for this calculation. ABG No results found for this basename: phart, pco2, po2, hco3, tco2, acidbasedef, o2sat     No results found for this basename: DDIMER     Other results:  I have pearsonaly reviewed this: ECG REPORT  Rate: 57  Rhythm: NSR ST&T Change: no ischemic changes  UA no infection  Cultures: No results found for this basename: sdes, specrequest, cult, reptstatus       Radiological Exams on Admission: Dg Chest 2 View  03/07/2011  *RADIOLOGY REPORT*  Clinical Data: Dizziness, weakness.  CHEST - 2 VIEW  Comparison: CT chest 10/13/2009 and chest radiograph 08/19/2009.  Findings: Trachea is midline.  Heart size normal.  Thoracic aorta is calcified.  Lungs are clear.  No pleural fluid.  IMPRESSION: No acute findings.  Original Report Authenticated By: Reyes Ivan, M.D.   Ct Head Wo Contrast  03/07/2011  *RADIOLOGY REPORT*  Clinical Data: Syncope and dizziness  CT HEAD WITHOUT CONTRAST  Technique:  Contiguous axial images were obtained from the base of the  skull through the vertex without contrast.  Comparison: MRI 12/15/2010  Findings: Age appropriate atrophy.  Chronic microvascular ischemia in the white matter of a moderate degree.  Negative for acute infarct.  Negative for hemorrhage or mass.  No midline shift. Calvarium is intact.  IMPRESSION: Atrophy and chronic microvascular ischemia.  No acute abnormality.  Original Report Authenticated By: Camelia Phenes, M.D.    Assessment/Plan  71 yo w likely hypoglycemia induced AMS but given risk factors will admit for TIA work up  Present on Admission:  .DIABETES, TYPE 2 - no hypoglycemia was ever recorded but symptoms are worrisome, will hold po meds and follow BG q4h, sliding scale, hypoglycemia protocol  .TIA (transient ischemic attack) -  - will admit based on TIA/CVA protocol, await results of MRA/MRI, Carotid Doppler and Echo, obtain cardiac enzymes,  ECG, Homocysteine, Lipid panel, TSH. Order PT/OT evaluation. Will make sure patient is on antiplatelet agent.       Marland KitchenHYPERTENSION - continue home medication  .Hyponatremia -  secondary to dehydration vs possibly chronic will give gentle IVF, check Urine Na, Cr, Osmolarity. Monitor Na levels to avoid over aggressive correction. Check TSH. Stop offending medications. Cont her home sodium tablets.   Prophylaxis: SCD  CODE STATUS: Patient wishes to BE DO NOT RESUSCITATE DO NOT INTUBATE   Devaris Quirk 03/07/2011, 7:09 PM

## 2011-03-07 NOTE — ED Notes (Signed)
Pt transported to ready bed stable and in no acute distress at this time via monitor with RN.

## 2011-03-07 NOTE — ED Notes (Signed)
Per pt, she woke this morning feeling dizzy and weak with slurred speech. Reports h/o hypoglycemic episodes with similar symptoms. States symptoms began to resolve after  Drinking orange juice, but states she continues to feel dizzy and weak  In the LUE. Hand grasps equal bilaterally with no neurological deficits at this time. A&Ox3

## 2011-03-08 ENCOUNTER — Inpatient Hospital Stay (HOSPITAL_COMMUNITY): Payer: Medicare Other

## 2011-03-08 ENCOUNTER — Other Ambulatory Visit: Payer: Self-pay

## 2011-03-08 LAB — CARDIAC PANEL(CRET KIN+CKTOT+MB+TROPI)
CK, MB: 4.3 ng/mL — ABNORMAL HIGH (ref 0.3–4.0)
CK, MB: 3.7 ng/mL (ref 0.3–4.0)
CK, MB: 3 ng/mL (ref 0.3–4.0)
CK, MB: 3 ng/mL (ref 0.3–4.0)
Relative Index: 1.5 (ref 0.0–2.5)
Relative Index: 1.6 (ref 0.0–2.5)
Relative Index: 1.8 (ref 0.0–2.5)
Relative Index: 1.7 (ref 0.0–2.5)
Total CK: 296 U/L — ABNORMAL HIGH (ref 7–177)
Total CK: 237 U/L — ABNORMAL HIGH (ref 7–177)
Total CK: 171 U/L (ref 7–177)
Total CK: 181 U/L — ABNORMAL HIGH (ref 7–177)
Troponin I: <0.3 ng/mL (ref <0.30)
Troponin I: <0.3 ng/mL (ref <0.30)
Troponin I: <0.3 ng/mL (ref <0.30)
Troponin I: <0.3 ng/mL (ref <0.30)

## 2011-03-08 LAB — LIPID PANEL
Cholesterol: 98 mg/dL (ref 0–200)
HDL: 44 mg/dL (ref >39)
LDL Cholesterol: 36 mg/dL (ref 0–99)
Total CHOL/HDL Ratio: 2.2 RATIO
Triglycerides: 92 mg/dL (ref <150)
VLDL: 18 mg/dL (ref 0–40)

## 2011-03-08 LAB — BASIC METABOLIC PANEL
BUN: 10 mg/dL (ref 6–23)
CO2: 27 mEq/L (ref 19–32)
Calcium: 8.4 mg/dL (ref 8.4–10.5)
Chloride: 95 mEq/L — ABNORMAL LOW (ref 96–112)
Creatinine, Ser: 0.65 mg/dL (ref 0.50–1.10)
GFR calc Af Amer: >90 mL/min (ref >90)
GFR calc non Af Amer: 88 mL/min — ABNORMAL LOW (ref >90)
Glucose, Bld: 209 mg/dL — ABNORMAL HIGH (ref 70–99)
Potassium: 4.4 mEq/L (ref 3.5–5.1)
Sodium: 126 mEq/L — ABNORMAL LOW (ref 135–145)

## 2011-03-08 LAB — GLUCOSE, CAPILLARY
Glucose-Capillary: 185 mg/dL — ABNORMAL HIGH (ref 70–99)
Glucose-Capillary: 84 mg/dL (ref 70–99)
Glucose-Capillary: 98 mg/dL (ref 70–99)
Glucose-Capillary: 123 mg/dL — ABNORMAL HIGH (ref 70–99)

## 2011-03-08 LAB — HEMOGLOBIN A1C
Hgb A1c MFr Bld: 6.2 % — ABNORMAL HIGH (ref <5.7)
Hgb A1c MFr Bld: 6.3 % — ABNORMAL HIGH (ref <5.7)
Mean Plasma Glucose: 131 mg/dL — ABNORMAL HIGH (ref <117)
Mean Plasma Glucose: 134 mg/dL — ABNORMAL HIGH (ref <117)

## 2011-03-08 LAB — OSMOLALITY, URINE: Osmolality, Ur: 206 mOsm/kg — ABNORMAL LOW (ref 390–1090)

## 2011-03-08 MED ORDER — INSULIN ASPART 100 UNIT/ML ~~LOC~~ SOLN
0.0000 [IU] | Freq: Three times a day (TID) | SUBCUTANEOUS | Status: DC
  Administered 2011-03-08 (×2): 2 [IU] via SUBCUTANEOUS
  Administered 2011-03-09 (×2): 3 [IU] via SUBCUTANEOUS
  Administered 2011-03-09 – 2011-03-10 (×2): 2 [IU] via SUBCUTANEOUS

## 2011-03-08 MED ORDER — CLONIDINE HCL 0.2 MG PO TABS
0.2000 mg | ORAL_TABLET | Freq: Two times a day (BID) | ORAL | Status: DC
  Administered 2011-03-08 – 2011-03-09 (×3): 0.2 mg via ORAL
  Filled 2011-03-08 (×4): qty 1

## 2011-03-08 MED ORDER — NEBIVOLOL HCL 10 MG PO TABS
10.0000 mg | ORAL_TABLET | Freq: Every day | ORAL | Status: DC
  Filled 2011-03-08: qty 1

## 2011-03-08 MED ORDER — SODIUM CHLORIDE 0.9 % IV SOLN
Freq: Once | INTRAVENOUS | Status: AC
  Administered 2011-03-08: 15:00:00 via INTRAVENOUS

## 2011-03-08 MED ORDER — AMLODIPINE BESYLATE 5 MG PO TABS
5.0000 mg | ORAL_TABLET | Freq: Every day | ORAL | Status: DC
  Administered 2011-03-08 – 2011-03-09 (×2): 5 mg via ORAL
  Filled 2011-03-08 (×2): qty 1

## 2011-03-08 MED ORDER — SODIUM CHLORIDE 0.9 % IV BOLUS (SEPSIS)
500.0000 mL | Freq: Once | INTRAVENOUS | Status: AC
  Administered 2011-03-08: 500 mL via INTRAVENOUS

## 2011-03-08 MED ORDER — INSULIN ASPART 100 UNIT/ML ~~LOC~~ SOLN: 0.0000 [IU] | Freq: Every day | SUBCUTANEOUS | Status: DC

## 2011-03-08 NOTE — Progress Notes (Signed)
OT Cancellation Note  Treatment cancelled this afternoon due to medical issues with patient which prohibited therapy. Pt now presenting with low BP of 91/56 in standing and with pulse rate dropping to 40's per RN report.  RN recommending therapy hold evaluation until tomorrow.  Will re-attempt tomorrow as appropriate. Thanks!  03/08/2011 Cipriano Mile OTR/L Pager [phone] Office [phone]

## 2011-03-08 NOTE — Progress Notes (Signed)
PT Cancellation Note:  Treatment cancelled this afternoon due to medical issues with patient which prohibited therapy. Pt now presenting with low BP of 91/56 in standing and with pulse rate dropping to 40's per RN report. RN recommending therapy hold evaluation until tomorrow. Will re-attempt tomorrow as appropriate. Thanks!  Synai Prettyman (Beverely Pace) Carleene Mains PT, DPT Acute Rehabilitation [phone]

## 2011-03-08 NOTE — ED Provider Notes (Signed)
I saw and evaluated the patient, reviewed the resident's note and I agree with the findings and plan and agree with their ECG interpretation. Patient had episode of a seizure. Also some confusion. She is back to baseline now. Actually admission for TIA rule out  Autumn Schneider. Rubin Payor, MD 03/08/11 [phone]

## 2011-03-08 NOTE — Progress Notes (Signed)
   CARE MANAGEMENT NOTE 03/08/2011  Patient:  [facility]   Account Number:  [id number]  Date Initiated:  03/08/2011  Documentation initiated by:  Letha Cape  Subjective/Objective Assessment:   dx tia, r/o for cva  admit- lives with spouse.     Action/Plan:   pt/ot eval   Anticipated DC Date:  03/11/2011   Anticipated DC Plan:  HOME W HOME HEALTH SERVICES      DC Planning Services  CM consult      Choice offered to / List presented to:             Status of service:  In process, will continue to follow Medicare Important Message given?   (If response is "NO", the following Medicare IM given date fields will be blank) Date Medicare IM given:   Date Additional Medicare IM given:    Discharge Disposition:    Per UR Regulation:    Comments:  PCP Candice Smith  03/08/11 14:03 Letha Cape RN, BSN [phone] patient lives with spouse, pta independent.  Patient has medication coverage.  Await pt eval.

## 2011-03-08 NOTE — Progress Notes (Signed)
PT Cancellation Note  Pt with elevated BP prior to session: 191/67, RN made aware. Will check back later today. Thanks!  Autumn Schneider (Beverely Pace) Carleene Mains PT, DPT Acute Rehabilitation [phone]

## 2011-03-08 NOTE — Progress Notes (Signed)
Interval History: This is a 71 year old woman who has a past medical history significant for hypertension, hyponatremia which is worked up by endocrine who presented to the emergency department after she had a episode getting out of bed it with profound weakness in her legs she could not bear weight she could not stand family reports question of slurred speech and she had some numbness in her right arm. The symptoms all resolved when she got to the hospital. Orthostatic vital signs were positive on admission. Her sodium level was 124. She states that she had been on fluid restriction. She was on no diuretics at home only taking bystolic for blood pressure. She was also noted to be bradycardic.  Assessment/Plan: 1. Weakness admitted with questionable TIA stroke workup negative.  *MRI neg, Dops pending  *symptoms resolved  2. Hyponatremia no clear diagnosis per endocrinology, she had been placed on salt tablets  * Normal Saline Challenge will repeat a BMET today, she report she had been on fluid  restriction  *strict I/O, serum and urine osmo normal  * cortisol pending?Adrenal Insufficiency, may need stress dose steroids, K ok.  3. Hypertension with concomitant orthostatic hypotension  *BP actually high but large change between sitting and standing SBP with change of  >25mmhg, brings on dizziness symptom  * ??Autonomic Insufficiency (normal saline may make this worse)  4. mild type 2 diabetes  *A1C pending was on oral medictaions  *SSI for now  5. Diagnosis of dementia in the last few months started on Aricept  *doubt med related, could consider holding  6. Hyperlipidemia  *on Crestor  7. Anxiety on benzodiazepines  *continune Tranzene  8. gastroesophageal reflux disease  * on Protonix   9. Dispo: Will need to make sure orthostasis resolved and she can safely ambulate with PT prior to dc.      Subjective: No complaints.  Objective: Vital signs in last 24 hours: Temp:  [97.2  F (36.2 C)-98.3 F (36.8 C)] 97.9 F (36.6 C) (02/19 1349) Pulse Rate:  [45-60] 57  (02/19 1531) Resp:  [14-21] 18  (02/19 1349) BP: (91-191)/(56-83) 91/56 mmHg (02/19 1531) SpO2:  [94 %-99 %] 97 % (02/19 1349) Weight:  [72.689 kg (160 lb 4 oz)] 72.689 kg (160 lb 4 oz) (02/18 2135) Weight change:  Last BM Date: 03/08/11  Intake/Output from previous day: 02/18 0701 - 02/19 0700 In: 240 [P.O.:240] Out: -    Physical Exam: General: Alert, awake, oriented x3, in no acute distress. HEENT: No bruits, no goiter. Heart: Regular rate and rhythm, without murmurs, rubs, gallops. Lungs: Clear to auscultation bilaterally. Abdomen: Soft, nontender, nondistended, positive bowel sounds. Extremities: No clubbing cyanosis or edema with positive pedal pulses. Neuro: Grossly intact, nonfocal.  Lab Results: Basic Metabolic Panel:  Basename 03/07/11 1538 03/07/11 1411  NA 124* 126*  K 4.6 5.7*  CL 89* 91*  CO2 28 28  GLUCOSE 137* 159*  BUN 11 11  CREATININE 0.53 0.56  CALCIUM 9.4 9.9  MG -- --  PHOS -- --   Liver Function Tests: No results found for this basename: AST:2,ALT:2,ALKPHOS:2,BILITOT:2,PROT:2,ALBUMIN:2 in the last 72 hours No results found for this basename: LIPASE:2,AMYLASE:2 in the last 72 hours No results found for this basename: AMMONIA:2 in the last 72 hours CBC:  Basename 03/07/11 1411  WBC 6.4  NEUTROABS --  HGB 12.8  HCT 36.2  MCV 87.2  PLT 242   Cardiac Enzymes:  Basename 03/08/11 1256 03/08/11 0402 03/07/11 2150  CKTOTAL 237* 296* 317*  CKMB 3.7 4.3* 5.0*  CKMBINDEX -- -- --  TROPONINI <0.30 <0.30 <0.30   BNP: No results found for this basename: PROBNP:3 in the last 72 hours D-Dimer: No results found for this basename: DDIMER:2 in the last 72 hours CBG:  Basename 03/08/11 1200 03/08/11 0733 03/08/11 0428 03/08/11 0114 03/07/11 2333 03/07/11 2255  GLUCAP 123* 98 84 185* 138* 64*   Hemoglobin A1C:  Basename 03/07/11 2141  HGBA1C 6.2*   Fasting  Lipid Panel:  Basename 03/08/11 0402  CHOL 98  HDL 44  LDLCALC 36  TRIG 92  CHOLHDL 2.2  LDLDIRECT --   Thyroid Function Tests:  Basename 03/07/11 1411  TSH 1.144  T4TOTAL --  FREET4 --  T3FREE --  THYROIDAB --    Basename 03/07/11 1456  COLORURINE YELLOW  LABSPEC 1.013  PHURINE 6.5  GLUCOSEU 100*  HGBUR TRACE*  BILIRUBINUR NEGATIVE  KETONESUR NEGATIVE  PROTEINUR NEGATIVE  UROBILINOGEN 0.2  NITRITE NEGATIVE  LEUKOCYTESUR NEGATIVE    No results found for this or any previous visit (from the past 240 hour(s)).  Studies/Results: Dg Chest 2 View  03/07/2011  *RADIOLOGY REPORT*  Clinical Data: Dizziness, weakness.  CHEST - 2 VIEW  Comparison: CT chest 10/13/2009 and chest radiograph 08/19/2009.  Findings: Trachea is midline.  Heart size normal.  Thoracic aorta is calcified.  Lungs are clear.  No pleural fluid.  IMPRESSION: No acute findings.  Original Report Authenticated By: Reyes Ivan, M.D.   Ct Head Wo Contrast  03/07/2011  *RADIOLOGY REPORT*  Clinical Data: Syncope and dizziness  CT HEAD WITHOUT CONTRAST  Technique:  Contiguous axial images were obtained from the base of the skull through the vertex without contrast.  Comparison: MRI 12/15/2010  Findings: Age appropriate atrophy.  Chronic microvascular ischemia in the white matter of a moderate degree.  Negative for acute infarct.  Negative for hemorrhage or mass.  No midline shift. Calvarium is intact.  IMPRESSION: Atrophy and chronic microvascular ischemia.  No acute abnormality.  Original Report Authenticated By: Camelia Phenes, M.D.   Mri Brain Without Contrast  03/08/2011  *RADIOLOGY REPORT*  Clinical Data:  Slurred speech.  Leg weakness. Dementia.  Diabetes. High blood pressure.  MRI HEAD WITHOUT CONTRAST MRA HEAD WITHOUT CONTRAST  Technique: Multiplanar, multiecho pulse sequences of the brain and surrounding structures were obtained according to standard protocol without intravenous contrast.  Angiographic  images of the head were obtained using MRA technique without contrast.  Comparison: 03/07/2011 CT.  12/15/2010 MR.  MRI HEAD  Findings:  No acute infarct.  No intracranial hemorrhage.  No intracranial mass lesion detected on this unenhanced exam.  Global atrophy without hydrocephalus.  Mild to moderate small vessel disease type changes.  Partially empty sella incidentally noted.  Minimal to mild mucosal thickening ethmoid sinus air cells left maxillary sinus.  IMPRESSION: No acute infarct.  Small vessel disease type changes.  Global atrophy without hydrocephalus.  MRA HEAD  Findings: Anterior circulation without medium or large size vessel significant stenosis or occlusion.  Middle cerebral artery branch vessel irregularity bilaterally.  Right vertebral artery is dominant in size.  Mild smooth narrowing of the basilar artery.  Nonvisualization of the right AICA.  Moderate stenosis proximal right superior cerebellar artery.  Mild to bulge origin of the right posterior cerebral artery and right superior cerebral artery without discrete aneurysm.  Posterior cerebral artery mild branch vessel irregularity.  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  Original Report Authenticated By: Viviann Spare  R. Constance Goltz, M.D.   Mr Maxine Glenn Head/brain Wo Cm  03/08/2011  *RADIOLOGY REPORT*  Clinical Data:  Slurred speech.  Leg weakness. Dementia.  Diabetes. High blood pressure.  MRI HEAD WITHOUT CONTRAST MRA HEAD WITHOUT CONTRAST  Technique: Multiplanar, multiecho pulse sequences of the brain and surrounding structures were obtained according to standard protocol without intravenous contrast.  Angiographic images of the head were obtained using MRA technique without contrast.  Comparison: 03/07/2011 CT.  12/15/2010 MR.  MRI HEAD  Findings:  No acute infarct.  No intracranial hemorrhage.  No intracranial mass lesion detected on this unenhanced exam.  Global atrophy without  hydrocephalus.  Mild to moderate small vessel disease type changes.  Partially empty sella incidentally noted.  Minimal to mild mucosal thickening ethmoid sinus air cells left maxillary sinus.  IMPRESSION: No acute infarct.  Small vessel disease type changes.  Global atrophy without hydrocephalus.  MRA HEAD  Findings: Anterior circulation without medium or large size vessel significant stenosis or occlusion.  Middle cerebral artery branch vessel irregularity bilaterally.  Right vertebral artery is dominant in size.  Mild smooth narrowing of the basilar artery.  Nonvisualization of the right AICA.  Moderate stenosis proximal right superior cerebellar artery.  Mild to bulge origin of the right posterior cerebral artery and right superior cerebral artery without discrete aneurysm.  Posterior cerebral artery mild branch vessel irregularity.  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  Original Report Authenticated By: Fuller Canada, M.D.    Medications: Scheduled Meds:   . sodium chloride   Intravenous Once  . amLODipine  5 mg Oral Daily  . aspirin  325 mg Oral Daily  . cloNIDine  0.2 mg Oral BID  . donepezil  5 mg Oral QHS  . insulin aspart  0-15 Units Subcutaneous TID WC  . insulin aspart  0-5 Units Subcutaneous QHS  . loratadine  10 mg Oral Daily  . nebivolol  10 mg Oral Daily  . pantoprazole  40 mg Oral Daily  . rosuvastatin  20 mg Oral QHS  . sodium chloride  500 mL Intravenous Once  . sodium chloride  1 g Oral BID  . DISCONTD: insulin aspart  0-9 Units Subcutaneous Q4H  . DISCONTD: nebivolol  10 mg Oral Daily   Continuous Infusions:   . sodium chloride 75 mL/hr at 03/07/11 2249   PRN Meds:.acetaminophen, clorazepate    LOS: 1 day   Gagandeep Pettet [facility]  03/08/2011, 5:41 PM

## 2011-03-08 NOTE — Progress Notes (Signed)
VASCULAR LAB PRELIMINARY  PRELIMINARY  PRELIMINARY  PRELIMINARY  Carotid duplex  completed.    Preliminary report:  Bilateral:  No evidence of hemodynamically significant internal carotid artery stenosis.   Vertebral artery flow is antegrade.      Terance Hart, RVT 03/08/2011, 9:59 AM

## 2011-03-08 NOTE — Progress Notes (Signed)
  Echocardiogram 2D Echocardiogram has been performed.  Autumn Schneider Autumn Schneider 03/08/2011, 11:02 AM

## 2011-03-08 NOTE — Progress Notes (Signed)
Utilization review completed.  

## 2011-03-08 NOTE — ED Provider Notes (Signed)
I saw and evaluated the patient, reviewed the resident's note and I agree with the findings and plan.  The patient was signed out to the resident from the previous shift.  Please see the attending's note associated with this visit.  The patient will be admitted to the hospital.  Geoffery Lyons, MD 03/08/11 [phone]

## 2011-03-08 NOTE — Progress Notes (Signed)
Notified MD of orthostatic changes in VS, 55 HR, 191/67 BP. MD to put in orders.

## 2011-03-08 NOTE — Progress Notes (Signed)
OT Cancellation Note  Treatment cancelled today due to pt with elevated BP prior to session: 191/67.  RN made aware. Will re-attempt as appropriate. Thanks!  03/08/2011 Cipriano Mile OTR/L Pager [phone] Office [phone]

## 2011-03-08 NOTE — Progress Notes (Signed)
Pt. Was hypertensive earlier in the the day. New BP meds were given this afternoon and a bolus was given for dehydration. Currently, pt. Is sustaining HR in the 40's at rest. Also, pt. Is experiencing orthostatic hypotension when standing. Pt. Is sleepy, easily arousable and has been through a lot of tests today. Exhausted from no sleep last night. MD Phillips Odor notified...put in BMET and I will continue to monitor pt.

## 2011-03-09 ENCOUNTER — Other Ambulatory Visit: Payer: Self-pay

## 2011-03-09 LAB — GLUCOSE, CAPILLARY
Glucose-Capillary: 143 mg/dL — ABNORMAL HIGH (ref 70–99)
Glucose-Capillary: 171 mg/dL — ABNORMAL HIGH (ref 70–99)
Glucose-Capillary: 167 mg/dL — ABNORMAL HIGH (ref 70–99)
Glucose-Capillary: 135 mg/dL — ABNORMAL HIGH (ref 70–99)
Glucose-Capillary: 192 mg/dL — ABNORMAL HIGH (ref 70–99)
Glucose-Capillary: 115 mg/dL — ABNORMAL HIGH (ref 70–99)

## 2011-03-09 LAB — CARDIAC PANEL(CRET KIN+CKTOT+MB+TROPI)
CK, MB: 2.9 ng/mL (ref 0.3–4.0)
CK, MB: 2.9 ng/mL (ref 0.3–4.0)
CK, MB: 2.8 ng/mL (ref 0.3–4.0)
CK, MB: 2.5 ng/mL (ref 0.3–4.0)
Relative Index: 1.8 (ref 0.0–2.5)
Relative Index: 2.1 (ref 0.0–2.5)
Relative Index: 2.5 (ref 0.0–2.5)
Relative Index: 2.3 (ref 0.0–2.5)
Total CK: 162 U/L (ref 7–177)
Total CK: 139 U/L (ref 7–177)
Total CK: 114 U/L (ref 7–177)
Total CK: 110 U/L (ref 7–177)
Troponin I: <0.3 ng/mL (ref <0.30)
Troponin I: <0.3 ng/mL
Troponin I: <0.3 ng/mL (ref <0.30)
Troponin I: <0.3 ng/mL (ref <0.30)

## 2011-03-09 LAB — BASIC METABOLIC PANEL
BUN: 9 mg/dL (ref 6–23)
CO2: 26 mEq/L (ref 19–32)
Calcium: 9.2 mg/dL (ref 8.4–10.5)
Chloride: 94 mEq/L — ABNORMAL LOW (ref 96–112)
Creatinine, Ser: 0.58 mg/dL (ref 0.50–1.10)
GFR calc Af Amer: >90 mL/min (ref >90)
GFR calc non Af Amer: >90 mL/min (ref >90)
Glucose, Bld: 121 mg/dL — ABNORMAL HIGH (ref 70–99)
Potassium: 4.6 mEq/L (ref 3.5–5.1)
Sodium: 127 mEq/L — ABNORMAL LOW (ref 135–145)

## 2011-03-09 LAB — CORTISOL: Cortisol, Plasma: 12.5 ug/dL

## 2011-03-09 MED ORDER — AMLODIPINE BESYLATE 10 MG PO TABS
10.0000 mg | ORAL_TABLET | Freq: Every day | ORAL | Status: DC
  Administered 2011-03-10: 10 mg via ORAL
  Filled 2011-03-09: qty 1

## 2011-03-09 MED ORDER — AMLODIPINE BESYLATE 5 MG PO TABS
5.0000 mg | ORAL_TABLET | Freq: Once | ORAL | Status: AC
  Administered 2011-03-09: 5 mg via ORAL
  Filled 2011-03-09: qty 1

## 2011-03-09 NOTE — Progress Notes (Signed)
Subjective: Patient is still complaining of dizziness/weakness.  Was able to work with physical therapy today.  Family concerned about low HR  Objective: Vital signs in last 24 hours: Filed Vitals:   03/09/11 0200 03/09/11 0600 03/09/11 1000 03/09/11 1053  BP: 151/82 132/69 145/78 129/70  Pulse: 56 50 49   Temp: 98.3 F (36.8 C) 97.7 F (36.5 C) 97.3 F (36.3 C)   TempSrc: Oral Oral Oral   Resp: 20 20 20    Height:      Weight:      SpO2: 98% 98% 98%    Weight change:   Intake/Output Summary (Last 24 hours) at 03/09/11 1134 Last data filed at 03/09/11 0830  Gross per 24 hour  Intake    240 ml  Output   1050 ml  Net   -810 ml    Physical Exam: General: Awake, Oriented, No acute distress. HEENT: EOMI. Neck: Supple CV: S1 and S2, bradycardia Lungs: Clear to ascultation bilaterally, no wheezing Abdomen: Soft, Nontender, Nondistended, +bowel sounds. Ext: Good pulses. Trace edema.   Lab Results:  [facility] 03/08/11 2008 03/07/11 1538  NA 126* 124*  K 4.4 4.6  CL 95* 89*  CO2 27 28  GLUCOSE 209* 137*  BUN 10 11  CREATININE 0.65 0.53  CALCIUM 8.4 9.4  MG -- --  PHOS -- --   No results found for this basename: AST:2,ALT:2,ALKPHOS:2,BILITOT:2,PROT:2,ALBUMIN:2 in the last 72 hours No results found for this basename: LIPASE:2,AMYLASE:2 in the last 72 hours  Basename 03/07/11 1411  WBC 6.4  NEUTROABS --  HGB 12.8  HCT 36.2  MCV 87.2  PLT 242    Basename 03/09/11 0333 03/08/11 2117 03/08/11 2020  CKTOTAL 162 181* 171  CKMB 2.9 3.0 3.0  CKMBINDEX -- -- --  TROPONINI <0.30 <0.30 <0.30   No components found with this basename: POCBNP:3 No results found for this basename: DDIMER:2 in the last 72 hours  Basename 03/08/11 1256 03/07/11 2141  HGBA1C 6.3* 6.2*    Basename 03/08/11 0402  CHOL 98  HDL 44  LDLCALC 36  TRIG 92  CHOLHDL 2.2  LDLDIRECT --    Basename 03/07/11 1411  TSH 1.144  T4TOTAL --  T3FREE --  THYROIDAB --   No results found for  this basename: VITAMINB12:2,FOLATE:2,FERRITIN:2,TIBC:2,IRON:2,RETICCTPCT:2 in the last 72 hours  Micro Results: No results found for this or any previous visit (from the past 240 hour(s)).  Studies/Results: Dg Chest 2 View  03/07/2011  *RADIOLOGY REPORT*  Clinical Data: Dizziness, weakness.  CHEST - 2 VIEW  Comparison: CT chest 10/13/2009 and chest radiograph 08/19/2009.  Findings: Trachea is midline.  Heart size normal.  Thoracic aorta is calcified.  Lungs are clear.  No pleural fluid.  IMPRESSION: No acute findings.  Original Report Authenticated By: Reyes Ivan, M.D.   Ct Head Wo Contrast  03/07/2011  *RADIOLOGY REPORT*  Clinical Data: Syncope and dizziness  CT HEAD WITHOUT CONTRAST  Technique:  Contiguous axial images were obtained from the base of the skull through the vertex without contrast.  Comparison: MRI 12/15/2010  Findings: Age appropriate atrophy.  Chronic microvascular ischemia in the white matter of a moderate degree.  Negative for acute infarct.  Negative for hemorrhage or mass.  No midline shift. Calvarium is intact.  IMPRESSION: Atrophy and chronic microvascular ischemia.  No acute abnormality.  Original Report Authenticated By: Camelia Phenes, M.D.   Mri Brain Without Contrast  03/08/2011  *RADIOLOGY REPORT*  Clinical Data:  Slurred speech.  Leg weakness.  Dementia.  Diabetes. High blood pressure.  MRI HEAD WITHOUT CONTRAST MRA HEAD WITHOUT CONTRAST  Technique: Multiplanar, multiecho pulse sequences of the brain and surrounding structures were obtained according to standard protocol without intravenous contrast.  Angiographic images of the head were obtained using MRA technique without contrast.  Comparison: 03/07/2011 CT.  12/15/2010 MR.  MRI HEAD  Findings:  No acute infarct.  No intracranial hemorrhage.  No intracranial mass lesion detected on this unenhanced exam.  Global atrophy without hydrocephalus.  Mild to moderate small vessel disease type changes.  Partially empty  sella incidentally noted.  Minimal to mild mucosal thickening ethmoid sinus air cells left maxillary sinus.  IMPRESSION: No acute infarct.  Small vessel disease type changes.  Global atrophy without hydrocephalus.  MRA HEAD  Findings: Anterior circulation without medium or large size vessel significant stenosis or occlusion.  Middle cerebral artery branch vessel irregularity bilaterally.  Right vertebral artery is dominant in size.  Mild smooth narrowing of the basilar artery.  Nonvisualization of the right AICA.  Moderate stenosis proximal right superior cerebellar artery.  Mild to bulge origin of the right posterior cerebral artery and right superior cerebral artery without discrete aneurysm.  Posterior cerebral artery mild branch vessel irregularity.  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  Original Report Authenticated By: Fuller Canada, M.D.   Mr Mra Head/brain Wo Cm  03/08/2011  *RADIOLOGY REPORT*  Clinical Data:  Slurred speech.  Leg weakness. Dementia.  Diabetes. High blood pressure.  MRI HEAD WITHOUT CONTRAST MRA HEAD WITHOUT CONTRAST  Technique: Multiplanar, multiecho pulse sequences of the brain and surrounding structures were obtained according to standard protocol without intravenous contrast.  Angiographic images of the head were obtained using MRA technique without contrast.  Comparison: 03/07/2011 CT.  12/15/2010 MR.  MRI HEAD  Findings:  No acute infarct.  No intracranial hemorrhage.  No intracranial mass lesion detected on this unenhanced exam.  Global atrophy without hydrocephalus.  Mild to moderate small vessel disease type changes.  Partially empty sella incidentally noted.  Minimal to mild mucosal thickening ethmoid sinus air cells left maxillary sinus.  IMPRESSION: No acute infarct.  Small vessel disease type changes.  Global atrophy without hydrocephalus.  MRA HEAD  Findings: Anterior circulation without medium or  large size vessel significant stenosis or occlusion.  Middle cerebral artery branch vessel irregularity bilaterally.  Right vertebral artery is dominant in size.  Mild smooth narrowing of the basilar artery.  Nonvisualization of the right AICA.  Moderate stenosis proximal right superior cerebellar artery.  Mild to bulge origin of the right posterior cerebral artery and right superior cerebral artery without discrete aneurysm.  Posterior cerebral artery mild branch vessel irregularity.  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  Original Report Authenticated By: Fuller Canada, M.D.    Medications: I have reviewed the patient's current medications. Scheduled Meds:   . sodium chloride   Intravenous Once  . amLODipine  10 mg Oral Daily  . amLODipine  5 mg Oral Once  . aspirin  325 mg Oral Daily  . donepezil  5 mg Oral QHS  . insulin aspart  0-15 Units Subcutaneous TID WC  . insulin aspart  0-5 Units Subcutaneous QHS  . loratadine  10 mg Oral Daily  . pantoprazole  40 mg Oral Daily  . rosuvastatin  20 mg Oral QHS  . sodium chloride  500 mL Intravenous Once  . sodium chloride  1 g  Oral BID  . DISCONTD: amLODipine  5 mg Oral Daily  . DISCONTD: cloNIDine  0.2 mg Oral BID  . DISCONTD: nebivolol  10 mg Oral Daily  . DISCONTD: nebivolol  10 mg Oral Daily   Continuous Infusions:  PRN Meds:.acetaminophen, clorazepate  Assessment/Plan: Bradycardia- on several medications that can cause bradycardia (aricept, bistolic,and clonidine)- will stop these and monitor overnight, may need EP as this could be the cause of patients weakness and dizziness   *TIA (transient ischemic attack)- W/up negative   DIABETES, TYPE 2- stable   HYPERTENSION- increase norvasc   Hyponatremia- await today's BMP- baseline about 128     LOS: 2 days  Noble Bodie, DO 03/09/2011, 11:34 AM

## 2011-03-09 NOTE — Progress Notes (Signed)
   CARE MANAGEMENT NOTE 03/09/2011  Patient:  Autumn Schneider, Autumn Schneider   Account Number:  [id number]  Date Initiated:  03/08/2011  Documentation initiated by:  Letha Cape  Subjective/Objective Assessment:   dx tia, r/o for cva  admit- lives with spouse.     Action/Plan:   pt/ot eval- rec outp pt and shower chair.   Anticipated DC Date:  03/11/2011   Anticipated DC Plan:  HOME W HOME HEALTH SERVICES      DC Planning Services  CM consult      Choice offered to / List presented to:  C-1 Patient   DME arranged  Ronita Hipps      DME agency  [facility]        Status of service:  In process, will continue to follow Medicare Important Message given?   (If response is "NO", the following Medicare IM given date fields will be blank) Date Medicare IM given:   Date Additional Medicare IM given:    Discharge Disposition:    Per UR Regulation:    Comments:  PCP Candice Smith  03/09/11 11:24 Letha Cape RN, BSN [phone] per physical therapy recs for outpt pt, referral faxed to outpt [facility], address and phone number for center given to patient.  Per MD patient heart rate is low so will not be ready for dc today, maybe tomorrow. Referral made to Darrin with [facility] for Shower chair, he will bring to patient's room.  03/08/11 14:03 Letha Cape RN, BSN [phone] patient lives with spouse, pta independent.  Patient has medication coverage.  Await pt eval.

## 2011-03-09 NOTE — Evaluation (Signed)
Occupational Therapy Evaluation Patient Details Name: Autumn Schneider MRN: [id number] DOB: November 11, 1940 Today's Date: 03/09/2011  Problem List:  Patient Active Problem List  Diagnoses  . DIABETES, TYPE 2  . EOSINOPHILIA  . EUSTACHIAN TUBE DYSFUNCTION  . HYPERTENSION  . LARYNGITIS, ACUTE  . ALLERGIC RHINITIS  . PRURITUS  . TIA (transient ischemic attack)  . Hyponatremia    Past Medical History:  Past Medical History  Diagnosis Date  . Diabetes mellitus   . Alzheimer disease     diagnosed 12/12  . Hypertension    Past Surgical History:  Past Surgical History  Procedure Date  . Cesarean section     OT Assessment/Plan/Recommendation OT Assessment Clinical Impression Statement:  71 year old female admitted with symptoms consistent with TIA, s/p weakness and sliding to floor after trying to get out of bed. Pt with score of 16/24 on Dynamic gait index (Scores of 19 or less are predicitve of falls in older community living adults). Recommend pt have supervision with mobility secondary to falls risk. Pt is at adequate level for d/c home with supervision OT Recommendation/Assessment: Patient does not need any further OT services OT Recommendation Follow Up Recommendations: No OT follow up Equipment Recommended: Tub/shower seat OT Goals    OT Evaluation Precautions/Restrictions  Precautions Precautions: Fall Precaution Comments: Needs supervision with ambulation Required Braces or Orthoses: No Restrictions Weight Bearing Restrictions: No Prior Functioning Home Living Lives With: Spouse;Son [place] Help From: Family Type of Home: House Home Layout: One level Home Access: Stairs to enter Entrance Stairs-Rails: Right Entrance Stairs-Number of Steps: 3 Bathroom Shower/Tub: Tub/shower unit;Walk-in shower Bathroom Toilet: Standard Bathroom Accessibility: Yes How Accessible: Accessible via walker Home Adaptive Equipment: None Prior Function Level of Independence: Independent  with basic ADLs Driving: Yes Vocation: Retired ADL ADL Statistician: Simulated;Modified independent Toilet Transfer Method: Proofreader: Regular height toilet Toileting - Clothing Manipulation: Simulated;Modified independent Where Assessed - Toileting Clothing Manipulation: Sit to stand from 3-in-1 or toilet Toileting - Hygiene: Not assessed Tub/Shower Transfer: Performed;Minimal assistance Tub/Shower Transfer Details (indicate cue type and reason): pt climbing down into the bottom of the tub. Pt required MIN (A) due to balance. Pt with BIL LE decreased strength and slight buckle with sit<>Stand. Tub/Shower Transfer Method: Ambulating Ambulation Related to ADLs: Pt ambulating >300 ft with Supervision. Pt with gait velocity 9 seconds/ 30 feet. Pt with balance deficits with head turns and decreasing speed. Pt stopping or decreasing speed of ambulation with cognitive challenges. ADL Comments: Pt is at adequate level for d/c home with family .Pt and sister educated that all ADLs should be performed with family nearby for balance. Vision/Perception  Vision - History Baseline Vision: Wears glasses only for reading Vision - Assessment Eye Alignment: Impaired (comment) Vision Assessment: Vision tested Ocular Range of Motion: Other (comment) (nystagmus noted) Saccades: Overshoots Convergence: Impaired (comment) Additional Comments: Rt side with decreased convergence Cognition Cognition Arousal/Alertness: Awake/alert Overall Cognitive Status: History of cognitive impairments History of Cognitive Impairment: Appears at baseline functioning Orientation Level: Oriented X4 Sensation/Coordination Sensation Light Touch: Appears Intact Coordination Gross Motor Movements are Fluid and Coordinated: Yes Fine Motor Movements are Fluid and Coordinated: Yes Extremity Assessment RUE Assessment RUE Assessment: Within Functional Limits LUE Assessment LUE Assessment: Within  Functional Limits Mobility  Bed Mobility Bed Mobility: No (seated at start) Transfers Transfers: Yes Sit to Stand: 5: Supervision Sit to Stand Details (indicate cue type and reason): Supervision for safety Stand to Sit: 5: Supervision Stand to Sit Details: supervision for safety  Exercises   End of Session OT - End of Session Equipment Utilized During Treatment: Gait belt Activity Tolerance: Patient tolerated treatment well Patient left: in chair;with call bell in reach;with family/visitor present (sister) Nurse Communication: Mobility status for transfers;Mobility status for ambulation General Behavior During Session: [facility] for tasks performed Cognition: Impaired, at baseline   Lucile Shutters 03/09/2011, 11:10 AM  Pager: [phone]

## 2011-03-09 NOTE — Progress Notes (Signed)
OT Discharge Note  Patient is being discharged from OT services secondary to:    Goals met and no further therapy needs identified.  Please see latest Therapy Progress Note for current level of functioning and progress toward goals.  Progress and discharge plan and discussed with patient/caregiver and they    Agree    Johnston, Marcee Jacobs Brynn   OTR/L Pager: [phone] Office: [phone] .     

## 2011-03-09 NOTE — Evaluation (Signed)
Physical Therapy Evaluation Patient Details Name: Autumn Schneider MRN: [id number] DOB: 06-23-1940 Today's Date: 03/09/2011  Problem List:  Patient Active Problem List  Diagnoses  . DIABETES, TYPE 2  . EOSINOPHILIA  . EUSTACHIAN TUBE DYSFUNCTION  . HYPERTENSION  . LARYNGITIS, ACUTE  . ALLERGIC RHINITIS  . PRURITUS  . TIA (transient ischemic attack)  . Hyponatremia    Past Medical History:  Past Medical History  Diagnosis Date  . Diabetes mellitus   . Alzheimer disease     diagnosed 12/12  . Hypertension    Past Surgical History:  Past Surgical History  Procedure Date  . Cesarean section     PT Assessment/Plan/Recommendation PT Assessment Clinical Impression Statement: 71 year old female admitted with symptoms consistent with TIA, s/p weakness and sliding to floor after trying to get out of bed. Pt with score of 16/24 on Dynamic gait index (Scores of 19 or less are predicitve of falls in older community living adults). Recommend pt have supervision with mobility secondary to falls risk, also recommend outpatient PT to address balance and strength deficits. Pt and family agreeable.  PT Recommendation/Assessment: Patient will need skilled PT in the acute care venue PT Problem List: Decreased strength;Decreased activity tolerance;Decreased balance;Decreased mobility;Decreased knowledge of use of DME PT Therapy Diagnosis : Difficulty walking;Abnormality of gait;Generalized weakness PT Plan PT Frequency: Min 3X/week PT Treatment/Interventions: DME instruction;Gait training;Stair training;Functional mobility training;Therapeutic activities;Therapeutic exercise;Balance training;Patient/family education PT Recommendation Follow Up Recommendations: Outpatient PT;Supervision for mobility/OOB Equipment Recommended: Tub/shower seat PT Goals  Acute Rehab PT Goals PT Goal Formulation: With patient Time For Goal Achievement: 2 weeks Pt will go Sit to Stand: with modified independence PT  Goal: Sit to Stand - Progress: Goal set today Pt will go Stand to Sit: with modified independence PT Goal: Stand to Sit - Progress: Goal set today Pt will Ambulate: >150 feet;with modified independence PT Goal: Ambulate - Progress: Goal set today Pt will Go Up / Down Stairs: 3-5 stairs;with modified independence PT Goal: Up/Down Stairs - Progress: Goal set today Additional Goals Additional Goal #1: Score >/= 19/24 on Dynamic Gait Index to demonstrate decreased risk for falls.  PT Goal: Additional Goal #1 - Progress: Goal set today  PT Evaluation Precautions/Restrictions  Precautions Precautions: Fall Precaution Comments: Needs supervision with ambulation Required Braces or Orthoses: No Restrictions Weight Bearing Restrictions: No Prior Functioning  Home Living Lives With: Spouse;Son (husband drives school bus - son works. alone 3-4 hrs total) Receives Help From: Family Type of Home: House Home Layout: One level Home Access: Stairs to enter Entrance Stairs-Rails: Right Entrance Stairs-Number of Steps: 3 Bathroom Shower/Tub: Tub/shower unit;Walk-in shower (usually uses tub) Bathroom Toilet: Standard Bathroom Accessibility: Yes How Accessible: Accessible via walker Home Adaptive Equipment: None Prior Function Level of Independence: Independent with basic ADLs Driving: Yes Vocation: Retired Designer, television/film set Level: Oriented X4 Sensation/Coordination Sensation Light Touch: Appears Intact Coordination Gross Motor Movements are Fluid and Coordinated: Yes Extremity Assessment RLE Assessment RLE Assessment: Exceptions to [facility] RLE AROM (degrees) Overall AROM Right Lower Extremity: Within functional limits for tasks assessed RLE Strength RLE Overall Strength: Deficits RLE Overall Strength Comments: Generalized deconditioning, grossly >/= 4-/5 LLE Assessment LLE Assessment: Exceptions to [facility]FL LLE AROM (degrees) Overall AROM Left Lower Extremity: Within  functional limits for tasks assessed LLE Strength LLE Overall Strength: Deficits LLE Overall Strength Comments: Generalized deconditioning, grossly >/= 4-/5 Mobility (including Balance) Bed Mobility Bed Mobility: No (seated at start) Transfers Transfers: Yes Sit to Stand: 5: Supervision Sit to Stand  Details (indicate cue type and reason): Supervision for safety Stand to Sit: 5: Supervision Stand to Sit Details: supervision for safety Ambulation/Gait Ambulation/Gait: Yes Ambulation/Gait Assistance: 5: Supervision Ambulation/Gait Assistance Details (indicate cue type and reason): Supervision secondary generalized balance deficits- pt able to self correct with no assist today. With DGI testing pt tends to veer to direction she is looking.  Ambulation Distance (Feet): 300 Feet (at minimum) Assistive device: None Gait Pattern: Step-through pattern;Decreased stride length (silght lateral instability. ) Stairs: Yes Stairs Assistance: 5: Supervision Stairs Assistance Details (indicate cue type and reason): Supervision for safety - recommended pt always use railing as she is more stable than without. Stair Management Technique: One rail Left;Step to pattern Number of Stairs: 4   Posture/Postural Control Posture/Postural Control: No significant limitations Balance Balance Assessed: Yes Static Sitting Balance Static Sitting - Balance Support: No upper extremity supported;Feet supported Static Sitting - Level of Assistance: 6: Modified independent (Device/Increase time) Static Standing Balance Static Standing - Balance Support: No upper extremity supported Static Standing - Level of Assistance: 5: Stand by assistance Dynamic Gait Index Level Surface: Mild Impairment Change in Gait Speed: Mild Impairment Gait with Horizontal Head Turns: Mild Impairment Gait with Vertical Head Turns: Mild Impairment Gait and Pivot Turn: Normal Step Over Obstacle: Mild Impairment Step Around Obstacles:  Mild Impairment Steps: Moderate Impairment Total Score: 16  End of Session PT - End of Session Equipment Utilized During Treatment: Gait belt Activity Tolerance: Patient tolerated treatment well Patient left: in chair;with call bell in reach;with family/visitor present Nurse Communication: Mobility status for ambulation General Behavior During Session: [facility] for tasks performed Cognition: Impaired, at baseline  Sherie Don 03/09/2011, 11:00 AM  Sherie Don) Carleene Mains PT, DPT Acute Rehabilitation [phone]

## 2011-03-10 LAB — BASIC METABOLIC PANEL
BUN: 10 mg/dL (ref 6–23)
CO2: 27 mEq/L (ref 19–32)
Calcium: 9.2 mg/dL (ref 8.4–10.5)
Chloride: 93 mEq/L — ABNORMAL LOW (ref 96–112)
Creatinine, Ser: 0.64 mg/dL (ref 0.50–1.10)
GFR calc Af Amer: >90 mL/min (ref >90)
GFR calc non Af Amer: 88 mL/min — ABNORMAL LOW (ref >90)
Glucose, Bld: 127 mg/dL — ABNORMAL HIGH (ref 70–99)
Potassium: 4.2 mEq/L (ref 3.5–5.1)
Sodium: 125 mEq/L — ABNORMAL LOW (ref 135–145)

## 2011-03-10 LAB — CARDIAC PANEL(CRET KIN+CKTOT+MB+TROPI)
CK, MB: 2.4 ng/mL (ref 0.3–4.0)
CK, MB: 2.4 ng/mL (ref 0.3–4.0)
Relative Index: INVALID (ref 0.0–2.5)
Relative Index: INVALID (ref 0.0–2.5)
Total CK: 96 U/L (ref 7–177)
Total CK: 88 U/L (ref 7–177)
Troponin I: <0.3 ng/mL (ref <0.30)
Troponin I: <0.3 ng/mL (ref <0.30)

## 2011-03-10 LAB — GLUCOSE, CAPILLARY: Glucose-Capillary: 140 mg/dL — ABNORMAL HIGH (ref 70–99)

## 2011-03-10 MED ORDER — AMLODIPINE BESYLATE 10 MG PO TABS: 10.0000 mg | ORAL_TABLET | Freq: Every day | ORAL | Status: DC

## 2011-03-10 NOTE — Progress Notes (Addendum)
Physical Therapy Treatment and Discharge Summary Patient Details Name: Shadai Cannaday MRN: [id number] DOB: 07-06-1940 Today's Date: 03/10/2011  PT Assessment/Plan  PT - Assessment/Plan Comments on Treatment Session: Patient presents with signficant improvements in mobility. With exception of ambulating at slower speeds and descending stairs with step to pattern patient's mobility is excellent for her age. (Pre-mobidly patient always used rail for stairs). She has no further PT needs at this time. I feel that her speed and efficiency will improve with change in heart rate/energy levels. PT Plan: All goals met and education completed, patient dischaged from PT services. This was discussed with patient and her sister and they agree. Follow Up Recommendations: No PT follow up Equipment Recommended: None recommended by PT PT Goals  Acute Rehab PT Goals PT Goal: Sit to Stand - Progress: Met PT Goal: Stand to Sit - Progress: Met PT Goal: Ambulate - Progress: Met PT Goal: Up/Down Stairs - Progress: Met Additional Goals PT Goal: Additional Goal #1 - Progress: Met  PT Treatment Precautions/Restrictions  Precautions Precautions: Fall Precaution Comments: Needs supervision with ambulation Required Braces or Orthoses: No Restrictions Weight Bearing Restrictions: No Mobility (including Balance) Transfers Sit to Stand: 7: Independent;With upper extremity assist;Without upper extremity assist;From bed Stand to Sit: 7: Independent;With upper extremity assist;Without upper extremity assist;To bed Ambulation/Gait Ambulation/Gait Assistance: 6: Modified independent (Device/Increase time) Ambulation/Gait Assistance Details (indicate cue type and reason): No imbalance noted with gait. Patient presents with overall slower speed with gait - 2.29 feet/second at fastest (indicative of neighborhhod ambulation speeds, but not nidicative of recurrent falls). She has the ability to accelerate but prefers slower speed  ? secondary to lower HR leading to lower energy levels. Ambulation Distance (Feet): 400 Feet Assistive device: None Gait Pattern: Within Functional Limits Stairs Assistance: 7: Independent Stair Management Technique: No rails;Alternating pattern;Step to pattern (Step to with descending) Number of Stairs: 6   Static Sitting Balance Static Sitting - Balance Support: No upper extremity supported;Feet supported Static Sitting - Level of Assistance: 7: Independent Static Standing Balance Static Standing - Balance Support: No upper extremity supported Static Standing - Level of Assistance: 7: Independent Tandem Stance - Right Leg: 60  Tandem Stance - Left Leg: 60  Rhomberg - Eyes Opened: 30  Rhomberg - Eyes Closed: 30  All terminated by PT once goal met Dynamic Gait Index Level Surface: Mild Impairment Change in Gait Speed: Normal Gait with Horizontal Head Turns: Normal Gait with Vertical Head Turns: Normal Gait and Pivot Turn: Normal Step Over Obstacle: Normal Step Around Obstacles: Normal Steps: Mild Impairment Total Score: 22  High Level Balance High Level Balance Activites: Side stepping;Backward walking;Direction changes;Turns;Sudden stops;Head turns High Level Balance Comments: without loss of balance End of Session PT - End of Session Equipment Utilized During Treatment: Gait belt Activity Tolerance: Patient tolerated treatment well Patient left: in bed;with call bell in reach;with family/visitor present Nurse Communication: Mobility status for ambulation General Behavior During Session: [facility] for tasks performed Cognition: [facility] for tasks performed  Edwyna Perfect, PT  Pager [phone]  03/10/2011, 10:08 AM

## 2011-03-10 NOTE — Consult Note (Signed)
Admit date: 03/07/2011 Referring Physician  Dr. Benjamine Mola Primary Physician Dr. Merri Brunette, Dr. Talmage Coin is endocrinologist Primary Cardiologist  Dr. Eldridge Dace Reason for Consultation  Evaluation of chest pain, bradycardia  HPI: 71 year old female with history of diabetes, hypertension, dementia, hyponatremia who was admitted on 03/07/11 for profound weakness. She remembers getting out of bed and leaning up against the bed but feeling very weak. Her husband came into the room and noted her being on the floor. She was moaning, slurred speech, had weakness in both legs. She has had issues in the past with testing her blood glucose at home and her family thought that her weakness was because she did not have enough food intake. Her symptoms did improve with banana and juice. Her blood glucose was not checked however her blood pressure was elevated at 193/85. Left arm numbness may have continued for about one hour but was resolved by the time she was in the emergency room. She's had several of these episodes in the past all attributed to low blood glucose. Dr. Talmage Coin has seen her with endocrinology in the past. She has dealt with hyponatremia in the past as well. She does take salt tablets and she is fluid restricted. When she came in her blood glucose was 124 and her potassium was 5.7. I asked the family about the possibility of Addison's disease and they stated that this has not been mentioned. Blood pressure was elevated however. She described no nausea, diaphoresis. Baseline sodium however is 128. Extensive workup. Throughout her admission, her telemetry has noted sinus bradycardia at times as low as 46 beats per minute but asymptomatic. This was thought to be iatrogenic from her blood pressure medications Bystolic 10mg  and perhaps Aricept and clonidine. These have been stopped and her heart rate has improved. Today while talking with her on telemetry she was 60 beats per minute. She has normal AV  interval, no pauses on telemetry. She was seen by Dr. Eldridge Dace in 2007 she states after one of these episodes. No obvious cardiac causes were noted. Currently she is without any chest discomfort, no shortness of breath, no dizziness. Her sister is here with her. She did have an episode of upper left chest wall/shoulder pain last night that she describes as sharp and fleeting lasting approximately 1 minute in duration without any radiation elsewhere and no associated symptoms of shortness of breath, diaphoresis.  PMH:   Past Medical History  Diagnosis Date  . Diabetes mellitus   . Alzheimer disease     diagnosed 12/12  . Hypertension     PSH:   Past Surgical History  Procedure Date  . Cesarean section    Allergies:  Erythromycin Prior to Admit Meds:   Prescriptions prior to admission  Medication Sig Dispense Refill  . aspirin EC 81 MG tablet Take 81 mg by mouth daily.      . calcium-vitamin D (OSCAL WITH D) 500-200 MG-UNIT per tablet Take 1 tablet by mouth 2 (two) times daily.      . cholecalciferol (VITAMIN D) 1000 UNITS tablet Take 1,000 Units by mouth daily.      . clorazepate (TRANXENE) 7.5 MG tablet Take 7.5 mg by mouth 2 (two) times daily as needed. For anxiety      . donepezil (ARICEPT) 5 MG tablet Take 5 mg by mouth at bedtime.      Marland Kitchen glipiZIDE (GLUCOTROL XL) 10 MG 24 hr tablet Take 10 mg by mouth 2 (two) times daily.      Marland Kitchen  loratadine (CLARITIN) 10 MG tablet Take 10 mg by mouth daily.      . nebivolol (BYSTOLIC) 10 MG tablet Take 10 mg by mouth daily.      . pantoprazole (PROTONIX) 40 MG tablet Take 40 mg by mouth daily.      . rosuvastatin (CRESTOR) 20 MG tablet Take 20 mg by mouth at bedtime.      . sodium chloride 1 G tablet Take 1 g by mouth 2 (two) times daily.       Fam HX:    Family History  Problem Relation Age of Onset  . Stroke Mother   . Diabetes Sister   . Breast cancer Sister    Social HX:    History   Social History  . Marital Status: Married     Spouse Name: N/A    Number of Children: N/A  . Years of Education: N/A   Occupational History  . Not on file.   Social History Main Topics  . Smoking status: Never Smoker   . Smokeless tobacco: Not on file  . Alcohol Use: No  . Drug Use: No  . Sexually Active:    Other Topics Concern  . Not on file   Social History Narrative  . No narrative on file     ROS:  All 11 ROS were addressed and are negative except what is stated in the HPI  Physical Exam: Blood pressure 148/73, pulse 49, temperature 97.8 F (36.6 C), temperature source Oral, resp. rate 22, height 4\' 11"  (1.499 m), weight 72.689 kg (160 lb 4 oz), SpO2 96.00%.    General: Well developed, well nourished, in no acute distress, in bed, comfortable Head: Eyes PERRLA, No xanthomas.   Normal cephalic and atramatic  Lungs:  Clear bilaterally to auscultation and percussion. Normal respiratory effort. No wheezes, no rales. Heart:   HRRR S1 S2 Pulses are 2+ & equal. No murmurs rubs or gallops.  No carotid bruit. No JVD.  No abdominal bruits. No femoral bruits. Abdomen: Bowel sounds are positive, abdomen soft and non-tender without masses. No hepatosplenomegaly. Msk:  Back normal, normal gait. Normal strength and tone for age. Extremities:   No clubbing, cyanosis or edema.  DP +1 Neuro: Alert and oriented X 3, non-focal, MAE x 4, she has mild baseline dementia GU: Deferred Rectal: Deferred Psych:  Good affect, responds appropriately    Labs:   Lab Results  Component Value Date   WBC 6.4 03/07/2011   HGB 12.8 03/07/2011   HCT 36.2 03/07/2011   MCV 87.2 03/07/2011   PLT 242 03/07/2011    Lab 03/10/11 0353  NA 125*  K 4.2  CL 93*  CO2 27  BUN 10  CREATININE 0.64  CALCIUM 9.2  PROT --  BILITOT --  ALKPHOS --  ALT --  AST --  GLUCOSE 127*   No results found for this basename: PTT   No results found for this basename: INR, PROTIME   Lab Results  Component Value Date   CKTOTAL 96 03/10/2011   CKMB 2.4  03/10/2011   TROPONINI <0.30 03/10/2011     Lab Results  Component Value Date   CHOL 98 03/08/2011   Lab Results  Component Value Date   HDL 44 03/08/2011   Lab Results  Component Value Date   LDLCALC 36 03/08/2011   Lab Results  Component Value Date   TRIG 92 03/08/2011   Lab Results  Component Value Date   CHOLHDL 2.2 03/08/2011  No results found for this basename: LDLDIRECT      Radiology:  Mri Brain Without Contrast  03/08/2011  *RADIOLOGY REPORT*  Clinical Data:  Slurred speech.  Leg weakness. Dementia.  Diabetes. High blood pressure.  MRI HEAD WITHOUT CONTRAST MRA HEAD WITHOUT CONTRAST  Technique: Multiplanar, multiecho pulse sequences of the brain and surrounding structures were obtained according to standard protocol without intravenous contrast.  Angiographic images of the head were obtained using MRA technique without contrast.  Comparison: 03/07/2011 CT.  12/15/2010 MR.  MRI HEAD  Findings:  No acute infarct.  No intracranial hemorrhage.  No intracranial mass lesion detected on this unenhanced exam.  Global atrophy without hydrocephalus.  Mild to moderate small vessel disease type changes.  Partially empty sella incidentally noted.  Minimal to mild mucosal thickening ethmoid sinus air cells left maxillary sinus.  IMPRESSION: No acute infarct.  Small vessel disease type changes.  Global atrophy without hydrocephalus.  MRA HEAD  Findings: Anterior circulation without medium or large size vessel significant stenosis or occlusion.  Middle cerebral artery branch vessel irregularity bilaterally.  Right vertebral artery is dominant in size.  Mild smooth narrowing of the basilar artery.  Nonvisualization of the right AICA.  Moderate stenosis proximal right superior cerebellar artery.  Mild to bulge origin of the right posterior cerebral artery and right superior cerebral artery without discrete aneurysm.  Posterior cerebral artery mild branch vessel irregularity.  IMPRESSION: Intracranial  atherosclerotic type changes predominately involving branch vessels as detailed above.  Original Report Authenticated By: Fuller Canada, M.D.   Mr Mra Head/brain Wo Cm  03/08/2011  *RADIOLOGY REPORT*  Clinical Data:  Slurred speech.  Leg weakness. Dementia.  Diabetes. High blood pressure.  MRI HEAD WITHOUT CONTRAST MRA HEAD WITHOUT CONTRAST  Technique: Multiplanar, multiecho pulse sequences of the brain and surrounding structures were obtained according to standard protocol without intravenous contrast.  Angiographic images of the head were obtained using MRA technique without contrast.  Comparison: 03/07/2011 CT.  12/15/2010 MR.  MRI HEAD  Findings:  No acute infarct.  No intracranial hemorrhage.  No intracranial mass lesion detected on this unenhanced exam.  Global atrophy without hydrocephalus.  Mild to moderate small vessel disease type changes.  Partially empty sella incidentally noted.  Minimal to mild mucosal thickening ethmoid sinus air cells left maxillary sinus.  IMPRESSION: No acute infarct.  Small vessel disease type changes.  Global atrophy without hydrocephalus.  MRA HEAD  Findings: Anterior circulation without medium or large size vessel significant stenosis or occlusion.  Middle cerebral artery branch vessel irregularity bilaterally.  Right vertebral artery is dominant in size.  Mild smooth narrowing of the basilar artery.  Nonvisualization of the right AICA.  Moderate stenosis proximal right superior cerebellar artery.  Mild to bulge origin of the right posterior cerebral artery and right superior cerebral artery without discrete aneurysm.  Posterior cerebral artery mild branch vessel irregularity.  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  Original Report Authenticated By: Fuller Canada, M.D.   EKG:  Multiple EKGs reviewed. When EKG showed sinus bradycardia rate 46 with no other ST segment changes. Other EKGs show sinus  rhythm 60. Telemetry reviewed and shows no evidence of pauses or dangerous arrhythmias. Her heart rate over the past 12 hours has increased and is currently 60. Personally viewed.   ASSESSMENT/PLAN:   71 year old female with recurrent weakness episodes, chronic hyponatremia seen by endocrinology, fatigue, diabetes, possible transient ischemic attack, hypertension with chest pain episode.  Chest pain - atypical, left upper chest wall, likely musculoskeletal. Cardiac markers were reassuring showing no evidence of acute coronary syndrome. EKG also reassuring. No further cardiac workup needed at this point. If she demonstrates exertional chest discomfort or worrisome symptoms at home, further workup with outpatient stress test would not be unreasonable.  Bradycardia-EKGs and telemetry reviewed as above. I agree with discontinuation of beta blocker as well as clonidine. Her heart rate seems to have responded appropriately. I agree with increasing her amlodipine. Her blood pressure is reasonably well controlled. She does not require pacemaker. I also do not believe that her weakness episode at home was related to symptomatic bradycardia. I do not think that her fatigue is currently related to bradycardia but is more of a metabolic phenomenon. TSH is normal.  Hyponatremia-extensive prior workup done. Fluid restriction. Unsure of the cause for her weakness. There have been no adverse arrhythmias detected here.  From my point of view, okay for discharge. Please let me know if you have any questions. She may continue to follow up with Dr. Merri Brunette her primary physician.  Donato Schultz, MD  03/10/2011  9:14 AM

## 2011-03-10 NOTE — Progress Notes (Signed)
Pt d/c to home by car with family. Assessment stable prescriptions given. Pt was told by PT today that they did not need outpatient therapy. Pt did not want to have this therapy set up. Discussed this with pt but she insisted therapist is no longer recommending outpatient therapy. RN spoke with PT and they confimed this.

## 2011-03-10 NOTE — Discharge Instructions (Signed)
STROKE/TIA DISCHARGE INSTRUCTIONS SMOKING Cigarette smoking nearly doubles your risk of having a stroke & is the single most alterable risk factor  If you smoke or have smoked in the last 12 months, you are advised to quit smoking for your health.  Most of the excess cardiovascular risk related to smoking disappears within a year of stopping.  Ask you doctor about anti-smoking medications  [place] Quit Line: 1-800-QUIT NOW  Free Smoking Cessation Classes [phone]  CHOLESTEROL Know your levels; limit fat & cholesterol in your diet  Lipid Panel     Component Value Date/Time   CHOL 98 03/08/2011 0402   TRIG 92 03/08/2011 0402   HDL 44 03/08/2011 0402   CHOLHDL 2.2 03/08/2011 0402   VLDL 18 03/08/2011 0402   LDLCALC 36 03/08/2011 0402      Many patients benefit from treatment even if their cholesterol is at goal.  Goal: Total Cholesterol (CHOL) less than 160  Goal:  Triglycerides (TRIG) less than 150  Goal:  HDL greater than 40  Goal:  LDL (LDLCALC) less than 100   BLOOD PRESSURE American Stroke Association blood pressure target is less that 120/80 mm/Hg  Your discharge blood pressure is:  BP: 132/69 mmHg  Monitor your blood pressure  Limit your salt and alcohol intake  Many individuals will require more than one medication for high blood pressure  DIABETES (A1c is a blood sugar average for last 3 months) Goal HGBA1c is under 7% (HBGA1c is blood sugar average for last 3 months)  Diabetes: {STROKE DC DIABETES:22357}    Lab Results  Component Value Date   HGBA1C 6.3* 03/08/2011     Your HGBA1c can be lowered with medications, healthy diet, and exercise.  Check your blood sugar as directed by your physician  Call your physician if you experience unexplained or low blood sugars.  PHYSICAL ACTIVITY/REHABILITATION Goal is 30 minutes at least 4 days per week    {STROKE DC ACTIVITY/REHAB:22359}  Activity decreases your risk of heart attack and stroke and makes your heart  stronger.  It helps control your weight and blood pressure; helps you relax and can improve your mood.  Participate in a regular exercise program.  Talk with your doctor about the best form of exercise for you (dancing, walking, swimming, cycling).  DIET/WEIGHT Goal is to maintain a healthy weight  Your discharge diet is: Carb Control *** liquids Your height is:  Height: 4\' 11"  (149.9 cm) Your current weight is: Weight: 72.689 kg (160 lb 4 oz) Your Body Mass Index (BMI) is:  BMI (Calculated): 32.4   Following the type of diet specifically designed for you will help prevent another stroke.  Your goal weight range is:  ***  Your goal Body Mass Index (BMI) is 19-24.  Healthy food habits can help reduce 3 risk factors for stroke:  High cholesterol, hypertension, and excess weight.  RESOURCES Stroke/Support Group:  Call [phone]  they meet the 3rd Sunday of the month on the Rehab Unit at [facility], [place] ( no meetings [place], July & Aug).  STROKE EDUCATION PROVIDED/REVIEWED AND GIVEN TO PATIENT Stroke warning signs and symptoms How to activate emergency medical system (call 911). Medications prescribed at discharge. Need for follow-up after discharge. Personal risk factors for stroke. Pneumonia vaccine given:   {STROKE DC YES/NO/DATE:22363} Flu vaccine given:   {STROKE DC YES/NO/DATE:22363} My questions have been answered, the writing is legible, and I understand these instructions.  I will adhere to these goals & educational materials that  have been provided to me after my discharge from the hospital.

## 2011-03-10 NOTE — Discharge Summary (Signed)
Discharge Summary  Autumn Schneider MR#: [id number]  DOB:06/22/1940  Date of Admission: 03/07/2011 Date of Discharge: 03/10/2011  Patient's PCP: Allean Found, MD, MD  Attending Physician:Samary Shatz  Consults: Cardiology- Deboraha Sprang Katrinka Blazing)  Discharge Diagnoses: Bradycardia  TIA (transient ischemic attack)  DIABETES, TYPE 2  HYPERTENSION  Hyponatremia   Brief Admitting History and Physical  Autumn Schneider is a 71 y.o. female  has a past medical history of Diabetes mellitus and Alzheimer disease.  Presented with  At 11:25am her husband heard her being on the floor. Patient was moaning with slurred speech, she had weakness in both legs, family thought this was because she did not have enough food intake. Symptoms improved with banana and juice. BG was not checked. BP was 193/85. No droop. Family denyes localized weakness but did have Left arm numbness for about 1 hour now resolved. She had a number of prior episodes in the past all attributed to not eating well.     Discharge Medications Medication List  As of 03/10/2011 10:23 AM   STOP taking these medications         donepezil 5 MG tablet      nebivolol 10 MG tablet         TAKE these medications         amLODipine 10 MG tablet   Commonly known as: NORVASC   Take 1 tablet (10 mg total) by mouth daily.      aspirin EC 81 MG tablet   Take 81 mg by mouth daily.      calcium-vitamin D 500-200 MG-UNIT per tablet   Commonly known as: OSCAL WITH D   Take 1 tablet by mouth 2 (two) times daily.      cholecalciferol 1000 UNITS tablet   Commonly known as: VITAMIN D   Take 1,000 Units by mouth daily.      clorazepate 7.5 MG tablet   Commonly known as: TRANXENE   Take 7.5 mg by mouth 2 (two) times daily as needed. For anxiety      glipiZIDE 10 MG 24 hr tablet   Commonly known as: GLUCOTROL XL   Take 10 mg by mouth 2 (two) times daily.      loratadine 10 MG tablet   Commonly known as: CLARITIN   Take 10 mg by mouth daily.       pantoprazole 40 MG tablet   Commonly known as: PROTONIX   Take 40 mg by mouth daily.      rosuvastatin 20 MG tablet   Commonly known as: CRESTOR   Take 20 mg by mouth at bedtime.      sodium chloride 1 G tablet   Take 1 g by mouth 2 (two) times daily.            Hospital Course: Bradycardia- patient was on several medications that can cause bradycardia (aricept, bistolic,and clonidine)- stopped these yesterday and watched HR overnight- slightly better today, Patient had an episode of chest tightness last PM, CE negative so far. Cardiology saw and said:  Chest pain - atypical, left upper chest wall, likely musculoskeletal. Cardiac markers were reassuring showing no evidence  of acute coronary syndrome. EKG also reassuring. No further cardiac workup needed at this point. If she demonstrates  exertional chest discomfort or worrisome symptoms at home, further workup with outpatient stress test would not be   unreasonable.   Bradycardia-EKGs and telemetry reviewed as above. I agree with discontinuation of beta blocker as well as clonidine. Her  heart  rate seems to have responded appropriately. I agree with increasing her amlodipine. Her blood pressure is   reasonably well controlled. She does not require pacemaker. I also do not believe that her weakness episode at home   was related to symptomatic bradycardia. I do not think that her fatigue is currently related to bradycardia but is more of a  metabolic phenomenon. TSH is normal TIA (transient ischemic attack)- W/up negative  DIABETES, TYPE 2- stable - resume home meds HYPERTENSION- increase norvasc, hold BB , instructed patient to check BP as outpatient and bring to PCP Hyponatremia- baseline about 128, patient had an extensive work up as an outpatient for this with no cause found. On salt tabs, BMP in 1 week    Day of Discharge BP 146/70  Pulse 49  Temp(Src) 97.8 F (36.6 C) (Oral)  Resp 22  Ht 4\' 11"  (1.499 m)  Wt 72.689 kg  (160 lb 4 oz)  BMI 32.37 kg/m2  SpO2 96%  Results for orders placed during the hospital encounter of 03/07/11 (from the past 48 hour(s))  GLUCOSE, CAPILLARY     Status: Abnormal   Collection Time   03/08/11 12:00 PM      Component Value Range Comment   Glucose-Capillary 123 (*) 70 - 99 (mg/dL)   CARDIAC PANEL(CRET KIN+CKTOT+MB+TROPI)     Status: Abnormal   Collection Time   03/08/11 12:56 PM      Component Value Range Comment   Total CK 237 (*) 7 - 177 (U/L)    CK, MB 3.7  0.3 - 4.0 (ng/mL)    Troponin I <0.30  <0.30 (ng/mL)    Relative Index 1.6  0.0 - 2.5    HEMOGLOBIN A1C     Status: Abnormal   Collection Time   03/08/11 12:56 PM      Component Value Range Comment   Hemoglobin A1C 6.3 (*) <5.7 (%)    Mean Plasma Glucose 134 (*) <117 (mg/dL)   CORTISOL     Status: Normal   Collection Time   03/08/11 12:56 PM      Component Value Range Comment   Cortisol, Plasma 12.5     GLUCOSE, CAPILLARY     Status: Abnormal   Collection Time   03/08/11  4:53 PM      Component Value Range Comment   Glucose-Capillary 143 (*) 70 - 99 (mg/dL)   BASIC METABOLIC PANEL     Status: Abnormal   Collection Time   03/08/11  8:08 PM      Component Value Range Comment   Sodium 126 (*) 135 - 145 (mEq/L)    Potassium 4.4  3.5 - 5.1 (mEq/L)    Chloride 95 (*) 96 - 112 (mEq/L)    CO2 27  19 - 32 (mEq/L)    Glucose, Bld 209 (*) 70 - 99 (mg/dL)    BUN 10  6 - 23 (mg/dL)    Creatinine, Ser 1.61  0.50 - 1.10 (mg/dL)    Calcium 8.4  8.4 - 10.5 (mg/dL)    GFR calc non Af Amer 88 (*) >90 (mL/min)    GFR calc Af Amer >90  >90 (mL/min)   CARDIAC PANEL(CRET KIN+CKTOT+MB+TROPI)     Status: Normal   Collection Time   03/08/11  8:20 PM      Component Value Range Comment   Total CK 171  7 - 177 (U/L)    CK, MB 3.0  0.3 - 4.0 (ng/mL)    Troponin I <0.30  <  0.30 (ng/mL)    Relative Index 1.8  0.0 - 2.5    CARDIAC PANEL(CRET KIN+CKTOT+MB+TROPI)     Status: Abnormal   Collection Time   03/08/11  9:17 PM       Component Value Range Comment   Total CK 181 (*) 7 - 177 (U/L)    CK, MB 3.0  0.3 - 4.0 (ng/mL)    Troponin I <0.30  <0.30 (ng/mL)    Relative Index 1.7  0.0 - 2.5    GLUCOSE, CAPILLARY     Status: Abnormal   Collection Time   03/08/11  9:44 PM      Component Value Range Comment   Glucose-Capillary 171 (*) 70 - 99 (mg/dL)    Comment 1 Documented in Chart      Comment 2 Notify RN     CARDIAC PANEL(CRET KIN+CKTOT+MB+TROPI)     Status: Normal   Collection Time   03/09/11  3:33 AM      Component Value Range Comment   Total CK 162  7 - 177 (U/L)    CK, MB 2.9  0.3 - 4.0 (ng/mL)    Troponin I <0.30  <0.30 (ng/mL)    Relative Index 1.8  0.0 - 2.5    GLUCOSE, CAPILLARY     Status: Abnormal   Collection Time   03/09/11  7:38 AM      Component Value Range Comment   Glucose-Capillary 167 (*) 70 - 99 (mg/dL)    Comment 1 Notify RN     GLUCOSE, CAPILLARY     Status: Abnormal   Collection Time   03/09/11 11:14 AM      Component Value Range Comment   Glucose-Capillary 135 (*) 70 - 99 (mg/dL)    Comment 1 Notify RN     BASIC METABOLIC PANEL     Status: Abnormal   Collection Time   03/09/11 11:30 AM      Component Value Range Comment   Sodium 127 (*) 135 - 145 (mEq/L)    Potassium 4.6  3.5 - 5.1 (mEq/L)    Chloride 94 (*) 96 - 112 (mEq/L)    CO2 26  19 - 32 (mEq/L)    Glucose, Bld 121 (*) 70 - 99 (mg/dL)    BUN 9  6 - 23 (mg/dL)    Creatinine, Ser 1.61  0.50 - 1.10 (mg/dL)    Calcium 9.2  8.4 - 10.5 (mg/dL)    GFR calc non Af Amer >90  >90 (mL/min)    GFR calc Af Amer >90  >90 (mL/min)   CARDIAC PANEL(CRET KIN+CKTOT+MB+TROPI)     Status: Normal   Collection Time   03/09/11 11:57 AM      Component Value Range Comment   Total CK 139  7 - 177 (U/L)    CK, MB 2.9  0.3 - 4.0 (ng/mL)    Troponin I <0.30  <0.30 (ng/mL)    Relative Index 2.1  0.0 - 2.5    CARDIAC PANEL(CRET KIN+CKTOT+MB+TROPI)     Status: Normal   Collection Time   03/09/11  3:58 PM      Component Value Range Comment    Total CK 114  7 - 177 (U/L)    CK, MB 2.8  0.3 - 4.0 (ng/mL)    Troponin I <0.30  <0.30 (ng/mL)    Relative Index 2.5  0.0 - 2.5    GLUCOSE, CAPILLARY     Status: Abnormal   Collection Time   03/09/11  4:46  PM      Component Value Range Comment   Glucose-Capillary 192 (*) 70 - 99 (mg/dL)   CARDIAC PANEL(CRET KIN+CKTOT+MB+TROPI)     Status: Normal   Collection Time   03/09/11  9:40 PM      Component Value Range Comment   Total CK 110  7 - 177 (U/L)    CK, MB 2.5  0.3 - 4.0 (ng/mL)    Troponin I <0.30  <0.30 (ng/mL)    Relative Index 2.3  0.0 - 2.5    GLUCOSE, CAPILLARY     Status: Abnormal   Collection Time   03/09/11 10:27 PM      Component Value Range Comment   Glucose-Capillary 115 (*) 70 - 99 (mg/dL)   CARDIAC PANEL(CRET KIN+CKTOT+MB+TROPI)     Status: Normal   Collection Time   03/10/11  3:53 AM      Component Value Range Comment   Total CK 96  7 - 177 (U/L)    CK, MB 2.4  0.3 - 4.0 (ng/mL)    Troponin I <0.30  <0.30 (ng/mL)    Relative Index RELATIVE INDEX IS INVALID  0.0 - 2.5    BASIC METABOLIC PANEL     Status: Abnormal   Collection Time   03/10/11  3:53 AM      Component Value Range Comment   Sodium 125 (*) 135 - 145 (mEq/L)    Potassium 4.2  3.5 - 5.1 (mEq/L)    Chloride 93 (*) 96 - 112 (mEq/L)    CO2 27  19 - 32 (mEq/L)    Glucose, Bld 127 (*) 70 - 99 (mg/dL)    BUN 10  6 - 23 (mg/dL)    Creatinine, Ser 1.61  0.50 - 1.10 (mg/dL)    Calcium 9.2  8.4 - 10.5 (mg/dL)    GFR calc non Af Amer 88 (*) >90 (mL/min)    GFR calc Af Amer >90  >90 (mL/min)   GLUCOSE, CAPILLARY     Status: Abnormal   Collection Time   03/10/11  7:15 AM      Component Value Range Comment   Glucose-Capillary 140 (*) 70 - 99 (mg/dL)     Dg Chest 2 View  0/96/0454  *RADIOLOGY REPORT*  Clinical Data: Dizziness, weakness.  CHEST - 2 VIEW  Comparison: CT chest 10/13/2009 and chest radiograph 08/19/2009.  Findings: Trachea is midline.  Heart size normal.  Thoracic aorta is calcified.  Lungs are  clear.  No pleural fluid.  IMPRESSION: No acute findings.  Original Report Authenticated By: Reyes Ivan, M.D.   Ct Head Wo Contrast  03/07/2011  *RADIOLOGY REPORT*  Clinical Data: Syncope and dizziness  CT HEAD WITHOUT CONTRAST  Technique:  Contiguous axial images were obtained from the base of the skull through the vertex without contrast.  Comparison: MRI 12/15/2010  Findings: Age appropriate atrophy.  Chronic microvascular ischemia in the white matter of a moderate degree.  Negative for acute infarct.  Negative for hemorrhage or mass.  No midline shift. Calvarium is intact.  IMPRESSION: Atrophy and chronic microvascular ischemia.  No acute abnormality.  Original Report Authenticated By: Camelia Phenes, M.D.   Mri Brain Without Contrast  03/08/2011  *RADIOLOGY REPORT*  Clinical Data:  Slurred speech.  Leg weakness. Dementia.  Diabetes. High blood pressure.  MRI HEAD WITHOUT CONTRAST MRA HEAD WITHOUT CONTRAST  Technique: Multiplanar, multiecho pulse sequences of the brain and surrounding structures were obtained according to standard protocol without intravenous contrast.  Angiographic  images of the head were obtained using MRA technique without contrast.  Comparison: 03/07/2011 CT.  12/15/2010 MR.  MRI HEAD  Findings:  No acute infarct.  No intracranial hemorrhage.  No intracranial mass lesion detected on this unenhanced exam.  Global atrophy without hydrocephalus.  Mild to moderate small vessel disease type changes.  Partially empty sella incidentally noted.  Minimal to mild mucosal thickening ethmoid sinus air cells left maxillary sinus.  IMPRESSION: No acute infarct.  Small vessel disease type changes.  Global atrophy without hydrocephalus.  MRA HEAD  Findings: Anterior circulation without medium or large size vessel significant stenosis or occlusion.  Middle cerebral artery branch vessel irregularity bilaterally.  Right vertebral artery is dominant in size.  Mild smooth narrowing of the basilar  artery.  Nonvisualization of the right AICA.  Moderate stenosis proximal right superior cerebellar artery.  Mild to bulge origin of the right posterior cerebral artery and right superior cerebral artery without discrete aneurysm.  Posterior cerebral artery mild branch vessel irregularity.  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  Original Report Authenticated By: Fuller Canada, M.D.   Mr Mra Head/brain Wo Cm  03/08/2011  *RADIOLOGY REPORT*  Clinical Data:  Slurred speech.  Leg weakness. Dementia.  Diabetes. High blood pressure.  MRI HEAD WITHOUT CONTRAST MRA HEAD WITHOUT CONTRAST  Technique: Multiplanar, multiecho pulse sequences of the brain and surrounding structures were obtained according to standard protocol without intravenous contrast.  Angiographic images of the head were obtained using MRA technique without contrast.  Comparison: 03/07/2011 CT.  12/15/2010 MR.  MRI HEAD  Findings:  No acute infarct.  No intracranial hemorrhage.  No intracranial mass lesion detected on this unenhanced exam.  Global atrophy without hydrocephalus.  Mild to moderate small vessel disease type changes.  Partially empty sella incidentally noted.  Minimal to mild mucosal thickening ethmoid sinus air cells left maxillary sinus.  IMPRESSION: No acute infarct.  Small vessel disease type changes.  Global atrophy without hydrocephalus.  MRA HEAD  Findings: Anterior circulation without medium or large size vessel significant stenosis or occlusion.  Middle cerebral artery branch vessel irregularity bilaterally.  Right vertebral artery is dominant in size.  Mild smooth narrowing of the basilar artery.  Nonvisualization of the right AICA.  Moderate stenosis proximal right superior cerebellar artery.  Mild to bulge origin of the right posterior cerebral artery and right superior cerebral artery without discrete aneurysm.  Posterior cerebral artery mild branch vessel  irregularity.  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  Original Report Authenticated By: Fuller Canada, M.D.     Disposition: home with outpatient PT  Diet: diabetic  Activity: as tolerated   Follow-up Appts: Dr. Katrinka Blazing in 1 week for BMP (hyponatremia)  Discharge Orders    Future Orders Please Complete By Expires   Diet Carb Modified      Increase activity slowly      Discharge instructions      Comments:   Outpatient physical therapy       Time spent on discharge, talking to the patient, and coordinating care: 40 mins.   SignedMarlin Canary, DO 03/10/2011, 10:23 AM

## 2011-03-10 NOTE — Progress Notes (Signed)
Subjective: Patient had episode of chest tightness. No fever, no chills, patient up walking around room this AM Family concerned because patient still fatigued and HR on the lower side but better  Objective: Vital signs in last 24 hours: Filed Vitals:   03/09/11 1800 03/09/11 2032 03/10/11 0200 03/10/11 0514  BP: 113/62 122/62 128/68 134/50  Pulse: 51 49 58 47  Temp: 98.1 F (36.7 C) 97.9 F (36.6 C) 98.9 F (37.2 C) 98 F (36.7 C)  TempSrc: Oral Oral Oral Oral  Resp: 20 20 20 22   Height:      Weight:      SpO2: 99% 97% 96% 99%   Weight change:   Intake/Output Summary (Last 24 hours) at 03/10/11 0736 Last data filed at 03/09/11 2200  Gross per 24 hour  Intake      0 ml  Output   1850 ml  Net  -1850 ml    Physical Exam: General: Awake, Oriented, No acute distress. HEENT: EOMI. Neck: Supple CV: S1 and S2, bradycardia Lungs: Clear to ascultation bilaterally, no wheezing Abdomen: Soft, Nontender, Nondistended, +bowel sounds. Ext: Good pulses. Trace edema.   Lab Results:  [facility] 03/10/11 0353 03/09/11 1130  NA 125* 127*  K 4.2 4.6  CL 93* 94*  CO2 27 26  GLUCOSE 127* 121*  BUN 10 9  CREATININE 0.64 0.58  CALCIUM 9.2 9.2  MG -- --  PHOS -- --      Basename 03/07/11 1411  WBC 6.4  NEUTROABS --  HGB 12.8  HCT 36.2  MCV 87.2  PLT 242    Basename 03/10/11 0353 03/09/11 2140 03/09/11 1558  CKTOTAL 96 110 114  CKMB 2.4 2.5 2.8  CKMBINDEX -- -- --  TROPONINI <0.30 <0.30 <0.30   No components found with this basename: POCBNP:3 No results found for this basename: DDIMER:2 in the last 72 hours  Basename 03/08/11 1256 03/07/11 2141  HGBA1C 6.3* 6.2*    Basename 03/08/11 0402  CHOL 98  HDL 44  LDLCALC 36  TRIG 92  CHOLHDL 2.2  LDLDIRECT --    Basename 03/07/11 1411  TSH 1.144  T4TOTAL --  T3FREE --  THYROIDAB --   No results found for this basename: VITAMINB12:2,FOLATE:2,FERRITIN:2,TIBC:2,IRON:2,RETICCTPCT:2 in the last 72 hours  Micro  Results: No results found for this or any previous visit (from the past 240 hour(s)).  Studies/Results: Mri Brain Without Contrast  03/08/2011  *RADIOLOGY REPORT*  Clinical Data:  Slurred speech.  Leg weakness. Dementia.  Diabetes. High blood pressure.  MRI HEAD WITHOUT CONTRAST MRA HEAD WITHOUT CONTRAST  Technique: Multiplanar, multiecho pulse sequences of the brain and surrounding structures were obtained according to standard protocol without intravenous contrast.  Angiographic images of the head were obtained using MRA technique without contrast.  Comparison: 03/07/2011 CT.  12/15/2010 MR.  MRI HEAD  Findings:  No acute infarct.  No intracranial hemorrhage.  No intracranial mass lesion detected on this unenhanced exam.  Global atrophy without hydrocephalus.  Mild to moderate small vessel disease type changes.  Partially empty sella incidentally noted.  Minimal to mild mucosal thickening ethmoid sinus air cells left maxillary sinus.  IMPRESSION: No acute infarct.  Small vessel disease type changes.  Global atrophy without hydrocephalus.  MRA HEAD  Findings: Anterior circulation without medium or large size vessel significant stenosis or occlusion.  Middle cerebral artery branch vessel irregularity bilaterally.  Right vertebral artery is dominant in size.  Mild smooth narrowing of the basilar artery.  Nonvisualization of the right  AICA.  Moderate stenosis proximal right superior cerebellar artery.  Mild to bulge origin of the right posterior cerebral artery and right superior cerebral artery without discrete aneurysm.  Posterior cerebral artery mild branch vessel irregularity.  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  Original Report Authenticated By: Fuller Canada, M.D.   Mr Mra Head/brain Wo Cm  03/08/2011  *RADIOLOGY REPORT*  Clinical Data:  Slurred speech.  Leg weakness. Dementia.  Diabetes. High blood pressure.  MRI HEAD WITHOUT  CONTRAST MRA HEAD WITHOUT CONTRAST  Technique: Multiplanar, multiecho pulse sequences of the brain and surrounding structures were obtained according to standard protocol without intravenous contrast.  Angiographic images of the head were obtained using MRA technique without contrast.  Comparison: 03/07/2011 CT.  12/15/2010 MR.  MRI HEAD  Findings:  No acute infarct.  No intracranial hemorrhage.  No intracranial mass lesion detected on this unenhanced exam.  Global atrophy without hydrocephalus.  Mild to moderate small vessel disease type changes.  Partially empty sella incidentally noted.  Minimal to mild mucosal thickening ethmoid sinus air cells left maxillary sinus.  IMPRESSION: No acute infarct.  Small vessel disease type changes.  Global atrophy without hydrocephalus.  MRA HEAD  Findings: Anterior circulation without medium or large size vessel significant stenosis or occlusion.  Middle cerebral artery branch vessel irregularity bilaterally.  Right vertebral artery is dominant in size.  Mild smooth narrowing of the basilar artery.  Nonvisualization of the right AICA.  Moderate stenosis proximal right superior cerebellar artery.  Mild to bulge origin of the right posterior cerebral artery and right superior cerebral artery without discrete aneurysm.  Posterior cerebral artery mild branch vessel irregularity.  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  Original Report Authenticated By: Fuller Canada, M.D.    Medications: I have reviewed the patient's current medications. Scheduled Meds:    . amLODipine  10 mg Oral Daily  . amLODipine  5 mg Oral Once  . aspirin  325 mg Oral Daily  . insulin aspart  0-15 Units Subcutaneous TID WC  . insulin aspart  0-5 Units Subcutaneous QHS  . loratadine  10 mg Oral Daily  . pantoprazole  40 mg Oral Daily  . rosuvastatin  20 mg Oral QHS  . sodium chloride  1 g Oral BID  . DISCONTD: amLODipine   5 mg Oral Daily  . DISCONTD: cloNIDine  0.2 mg Oral BID  . DISCONTD: donepezil  5 mg Oral QHS  . DISCONTD: nebivolol  10 mg Oral Daily   Continuous Infusions:  PRN Meds:.acetaminophen, clorazepate  Assessment/Plan: Bradycardia- patient was on several medications that can cause bradycardia (aricept, bistolic,and clonidine)- stopped these yesterday and watched HR overnight- slightly better today,  Patient had an episode of chest tightness last PM, CE negative so far.  Will consult [facility] cardiology as patient has seen them in the past- spoke with Dr. Eldridge Dace and Dr. Katrinka Blazing   TIA (transient ischemic attack)- W/up negative   DIABETES, TYPE 2- stable   HYPERTENSION- increase norvasc, hold BB   Hyponatremia- await today's BMP- baseline about 128, patient had an extensive work up as an outpatient for this with no cause found. On salt tabs, ? Reset osmo  Fatigue- ? Multifocal (Na? Vs bradycardia vs other etiology)- TSH ok    LOS: 3 days  Autumn Shells, DO 03/10/2011, 7:36 AM

## 2011-03-27 NOTE — ED Provider Notes (Signed)
I saw and evaluated the patient, reviewed the resident's note and I agree with the findings and plan.  The patient presents after an episode of weakness, inability to ambulate at home.  She has an extensive medical history and has had similar episodes in the past.  On exam, she is neurologically intact with no motor deficits.  Cranial nerves are intact.  The heart and lung exam are within normal limits.  Workup was initiated and was essentially unremarkable.  She will be admitted to the medicine service for further workup.  Geoffery Lyons, MD 03/27/11 1218

## 2012-07-16 ENCOUNTER — Encounter: Payer: Self-pay | Admitting: Diagnostic Neuroimaging

## 2012-07-16 ENCOUNTER — Ambulatory Visit (INDEPENDENT_AMBULATORY_CARE_PROVIDER_SITE_OTHER): Payer: Medicare Other | Admitting: Diagnostic Neuroimaging

## 2012-07-16 VITALS — BP 134/74 | HR 75 | Ht 59.0 in | Wt 133.0 lb

## 2012-07-16 DIAGNOSIS — F0390 Unspecified dementia without behavioral disturbance: Secondary | ICD-10-CM

## 2012-07-16 MED ORDER — MEMANTINE HCL ER 28 MG PO CP24: 1.0000 | ORAL_CAPSULE | Freq: Every day | ORAL | Status: DC

## 2012-07-16 NOTE — Progress Notes (Signed)
[facility]  PATIENT: Autumn Schneider DOB: 1940/05/25   HISTORY FROM: patient, chart, daughter REASON FOR VISIT: routine follow up   HISTORICAL  CHIEF COMPLAINT:  Chief Complaint  Patient presents with  . Follow-up    Rv #6    HISTORY OF PRESENT ILLNESS:  UPDATE 07/16/12: Patient is accompanied by her daughter today. Patient has experienced loss of her son that was living with her since last office visit.  Son commit suicide at the end of last year over despair of divorce.  Right after incident, patient became very depressed and lost 20-30 lbs.  She started seeing a psychologist regularly and has been doing much better over the last 4 months.  Memory loss is stable.  She is still driving short distances and taking care of her finances. Her husband monitors her medications.   UPDATE  05/23/11: Still driving. Son helps with her meds. Watches a lot of TV. Not much exercise.   UPDATE 03/16/11: Here for evaluation of her memory after being taken off her Aricept during hospitalization. She had an episode of left-sided leg and arm numbness and tingling, slurred speech on 03/08/2011. She was admitted to [facility] for 4 days for a TIA. During her hospitalization her heart rate dropped into the 40s and her Aricept and diastolic were discontinued. Hospitalization was complicated by dehydration and hyponatremia. At this time she does not want to be restarted on her Aricept. Fall risk assessment 7.  She denies any further episodes of numbness, tingling, weakness, slurred speech, swallowing difficulty. Hemoglobin A1c was 6.3. Carotid Dopplers indicated patient may have had some aortic surgery, patient denies. MRI and MRA of the brain were negative.  PRIOR HPI:  [name] is a 72 year-old right-handed Caucasian female who comes in for memory loss.   Patient denies any significant memory loss. She thinks she is doing well. She thinks that she has had some depression and decreased  mood related to retirementand her sons misfortune of his house burning down. There is an additional stress since her son has had to move in with her.  According to the patient's sister who is representing her family's observation, they have noticed progressive short-term memory loss since the patient return in 2010. She's had more significant memory decline in the past 5 months particularly in the last 3-4 weeks. For example patient forgets short-term conversations, where she placed objects, and recently forgot where she parked her car at the mall, even though she parked in the same place she is thin parking for many years. The patient's family is concerned about her ability to drive safely. She lives with her husband and her son. She is able to maintain her personal activities of daily living (6), but is no longer able to cook independently while maintaining her finances Autumn Schneider 4).  Of note, she has an identical twin sister who does not have any memory problems.  REVIEW OF SYSTEMS: Full 14 system review of systems performed and notable only for: Constitutional: weight loss Cardiovascular: N/A  Ear/Nose/Throat: N/A  Skin: N/A  Eyes: N/A  Respiratory: N/A  Gastroitestinal: N/A  Hematology/Lymphatic: N/A  Endocrine: N/A  Musculoskeletal:N/A  Allergy/Immunology: N/A  Neurological: Memory loss  Psychiatric: change in appetitie   ALLERGIES: Allergies  Allergen Reactions  . Erythromycin Hives    HOME MEDICATIONS: Outpatient Prescriptions Prior to Visit  Medication Sig Dispense Refill  . aspirin EC 81 MG tablet Take 81 mg by mouth daily.      Marland Kitchen  calcium-vitamin D (OSCAL WITH D) 500-200 MG-UNIT per tablet Take 1 tablet by mouth 2 (two) times daily.      . cholecalciferol (VITAMIN D) 1000 UNITS tablet Take 1,000 Units by mouth daily.      . clorazepate (TRANXENE) 7.5 MG tablet Take 7.5 mg by mouth daily. For anxiety,      . loratadine (CLARITIN) 10 MG tablet Take 10 mg by mouth daily.        . pantoprazole (PROTONIX) 40 MG tablet Take 40 mg by mouth daily.      . rosuvastatin (CRESTOR) 20 MG tablet Take 20 mg by mouth at bedtime.      . sodium chloride 1 G tablet Take 1 g by mouth 2 (two) times daily.      Marland Kitchen amLODipine (NORVASC) 10 MG tablet Take 1 tablet (10 mg total) by mouth daily.  30 tablet  0  . glipiZIDE (GLUCOTROL XL) 10 MG 24 hr tablet Take 10 mg by mouth 2 (two) times daily.      Marland Kitchen ACCU-CHEK SMARTVIEW test strip    Sig:   . DISCONTD: NAMENDA 10 MG tablet    Sig:   . metFORMIN (GLUCOPHAGE) 500 MG tablet    Sig: Take 500 mg by mouth 2 (two) times daily. Take one tab in the am and two in the pm.  . mirtazapine (REMERON) 15 MG tablet    Sig:    PAST MEDICAL HISTORY: Past Medical History  Diagnosis Date  . Diabetes mellitus   . Alzheimer disease     diagnosed 12/12  . Hypertension   . High cholesterol   . Migraine   . Anxiety and depression   . TIA (transient ischemic attack)   . Hyponatremia     PAST SURGICAL HISTORY: Past Surgical History  Procedure Laterality Date  . Cesarean section    . Shoulder surgery  2004    FAMILY HISTORY: Family History  Problem Relation Age of Onset  . Stroke Mother   . Diabetes Sister   . Breast cancer Sister   . Fibromyalgia Sister   . Stroke Maternal Grandmother     SOCIAL HISTORY: History   Social History  . Marital Status: Married    Spouse Name: N/A    Number of Children: 2  . Years of Education: 12th   Occupational History  . Retired    Social History Main Topics  . Smoking status: Never Smoker   . Smokeless tobacco: Not on file  . Alcohol Use: No  . Drug Use: No  . Sexually Active:    Other Topics Concern  . Not on file   Social History Narrative   Pt lives at home with her spouse and son.   Caffeine Use: 2-3 cups of coffee daily.     PHYSICAL EXAM  Filed Vitals:   07/16/12 1441  BP: 134/74  Pulse: 75  Height: 4\' 11"  (1.499 m)  Weight: 133 lb (60.328 kg)   Body mass index is  26.85 kg/(m^2).  Generalized: In no acute distress, NEG MYERSON'S, NEG PALMOMENTAL, NEG SNOUT.  Neck: Supple, no carotid bruits   Cardiac: Regular rate rhythm, no murmur   Pulmonary: Clear to auscultation bilaterally   Musculoskeletal: No deformity   Neurological examination   Mentation: Alert oriented to place, history taking, language fluent, and causual conversation. MMSE 23/30 WITH DEFICITS IN TIME, (COULD NOT GIVE YEAR, DAY OF THE WEEK, OR DATE). RECALL 0/3, COPYING A FIGURE. CLOCK DRAWING 2/4, ANIMAL FLUENCY 8 (NML  12+).  GERIATRIC DEPRESSION SCORE 1. (5+ indicates depression).  Cranial nerve II-XII: Pupils were unequal, L>R, round reactive to light extraocular movements were full, visual field were full on confrontational test. facial sensation and strength were normal. hearing was intact to finger rubbing bilaterally. Uvula tongue midline. head turning and shoulder shrug and were normal and symmetric.Tongue protrusion into cheek strength was normal. MOTOR: normal bulk and tone, full strength in the BUE, BLE, fine finger movements normal, no pronator drift SENSORY: normal and symmetric to light touch, pinprick, temperature, vibration and proprioception COORDINATION: finger-nose-finger, heel-to-shin bilaterally, there was no truncal ataxia REFLEXES: Brachioradialis 2/2, biceps 2/2, triceps 2/2, patellar 2/2, Achilles 2/2, plantar responses were flexor bilaterally. GAIT/STATION: Rising up from seated position without assistance, normal stance, without trunk ataxia, moderate stride, good arm swing, smooth turning, able to perform tiptoe, and heel walking without difficulty.   DIAGNOSTIC DATA (LABS, IMAGING, TESTING) - I reviewed patient records, labs, notes, testing and imaging myself where available.  03/07/11: MRI BRAIN Wo CONTRAST:  Atrophy and chronic microvascular ischemia. No acute abnormality.  ASSESSMENT AND PLAN  [name] is a 72 y.o. year old right-handed female  with hypertension, diabetes, hypercholesterolemia, depression, and TIA here with dementia without behavioral disturbance on Namenda.  Memory loss stable.  PLAN: 1. Continue Namenda.  Use up the Namenda 10mg  that you have and when you pick up new prescription, it will be Namenda XR 28mg  once daily. 2. May follow up with your PCP, Merri Brunette for follow up of dementia and see [place] on an as needed basis.  Meds ordered this encounter  Medications  . DISCONTD: memantine (NAMENDA) 10 MG tablet    Sig: Take 10 mg by mouth 2 (two) times daily.  . Memantine HCl ER (NAMENDA XR) 28 MG CP24    Sig: Take 28 mg by mouth daily.    Dispense:  90 capsule    Refill:  3    Order Specific Question:  Supervising Provider    Answer:  Suanne Marker [[id number]]     Shronda Boeh NP-C 07/16/2012, 4:05 PM  [facility] [address], [address] [place], [place] [address] [phone]

## 2012-07-16 NOTE — Patient Instructions (Addendum)
Finish up the Namenda 10 mg that you have.  The next time you pick up the prescription, it will be a once a day, 28mg  Namenda XR.  You may follow up with your PCP, Merri Brunette in the future and see [place] as needed.

## 2012-09-03 ENCOUNTER — Ambulatory Visit: Payer: Medicare Other | Attending: Internal Medicine | Admitting: Physical Therapy

## 2012-09-03 DIAGNOSIS — M545 Low back pain, unspecified: Secondary | ICD-10-CM | POA: Insufficient documentation

## 2012-09-03 DIAGNOSIS — R5381 Other malaise: Secondary | ICD-10-CM | POA: Insufficient documentation

## 2012-09-03 DIAGNOSIS — IMO0001 Reserved for inherently not codable concepts without codable children: Secondary | ICD-10-CM | POA: Insufficient documentation

## 2012-09-10 ENCOUNTER — Ambulatory Visit: Payer: Medicare Other | Admitting: Physical Therapy

## 2012-09-18 ENCOUNTER — Encounter: Payer: Medicare Other | Admitting: Physical Therapy

## 2012-09-19 ENCOUNTER — Ambulatory Visit: Payer: Medicare Other | Attending: Internal Medicine | Admitting: Physical Therapy

## 2012-09-19 DIAGNOSIS — IMO0001 Reserved for inherently not codable concepts without codable children: Secondary | ICD-10-CM | POA: Insufficient documentation

## 2012-09-19 DIAGNOSIS — R5381 Other malaise: Secondary | ICD-10-CM | POA: Insufficient documentation

## 2012-09-24 ENCOUNTER — Encounter: Payer: Medicare Other | Admitting: Physical Therapy

## 2012-09-26 ENCOUNTER — Ambulatory Visit: Payer: Medicare Other | Admitting: Physical Therapy

## 2013-02-13 ENCOUNTER — Other Ambulatory Visit: Payer: Self-pay

## 2013-02-13 MED ORDER — MEMANTINE HCL ER 28 MG PO CP24: 1.0000 | ORAL_CAPSULE | Freq: Every day | ORAL | Status: DC

## 2013-08-18 ENCOUNTER — Other Ambulatory Visit: Payer: Self-pay | Admitting: Diagnostic Neuroimaging

## 2013-08-31 ENCOUNTER — Encounter: Payer: Self-pay | Admitting: *Deleted

## 2013-10-21 ENCOUNTER — Other Ambulatory Visit: Payer: Self-pay | Admitting: Diagnostic Neuroimaging

## 2014-02-10 ENCOUNTER — Ambulatory Visit (INDEPENDENT_AMBULATORY_CARE_PROVIDER_SITE_OTHER): Payer: Medicare Other | Admitting: Diagnostic Neuroimaging

## 2014-02-10 ENCOUNTER — Encounter: Payer: Self-pay | Admitting: Diagnostic Neuroimaging

## 2014-02-10 VITALS — BP 156/83 | HR 86 | Temp 98.6°F | Ht 59.0 in | Wt 123.2 lb

## 2014-02-10 DIAGNOSIS — F0390 Unspecified dementia without behavioral disturbance: Secondary | ICD-10-CM

## 2014-02-10 DIAGNOSIS — F03B0 Unspecified dementia, moderate, without behavioral disturbance, psychotic disturbance, mood disturbance, and anxiety: Secondary | ICD-10-CM

## 2014-02-10 NOTE — Patient Instructions (Signed)
Continue memantine 10mg  twice a day.  Caution with home safety and supervision issues.

## 2014-02-10 NOTE — Progress Notes (Signed)
[facility]  PATIENT: Autumn Schneider DOB: 04/20/1940   HISTORY FROM: patient, chart, daughter REASON FOR VISIT: routine follow up   HISTORICAL  CHIEF COMPLAINT:  Chief Complaint  Patient presents with  . New Evaluation    worsening dementia     HISTORY OF PRESENT ILLNESS:  UPDATE 02/10/14: Since last visit, dementia has progressed. Here with husband today. Patient sometimes forgets to take a bath and gets "ornery" about it. Sometimes forgets to take meds. Husband and son are herlping her with medications. She stays home mainly. No pain or complaints today. Eats fairly well. Tends to "nibble" on food. More confusion in early AM.   UPDATE 07/16/12: Patient is accompanied by her daughter today. Patient has experienced loss of her son that was living with her since last office visit.  Son commit suicide at the end of last year over despair of divorce.  Right after incident, patient became very depressed and lost 20-30 lbs.  She started seeing a psychologist regularly and has been doing much better over the last 4 months.  Memory loss is stable.  She is still driving short distances and taking care of her finances. Her husband monitors her medications.   UPDATE  05/23/11: Still driving. Son helps with her meds. Watches a lot of TV. Not much exercise.   UPDATE 03/16/11: Here for evaluation of her memory after being taken off her Aricept during hospitalization. She had an episode of left-sided leg and arm numbness and tingling, slurred speech on 03/08/2011. She was admitted to [facility] for 4 days for a TIA. During her hospitalization her heart rate dropped into the 40s and her Aricept and diastolic were discontinued. Hospitalization was complicated by dehydration and hyponatremia. At this time she does not want to be restarted on her Aricept. Fall risk assessment 7. She denies any further episodes of numbness, tingling, weakness, slurred speech, swallowing difficulty.  Hemoglobin A1c was 6.3. Carotid Dopplers indicated patient may have had some aortic surgery, patient denies. MRI and MRA of the brain were negative.  PRIOR HPI:  [name] is a 74 year-old right-handed Caucasian female who comes in for memory loss.   Patient denies any significant memory loss. She thinks she is doing well. She thinks that she has had some depression and decreased mood related to retirementand her sons misfortune of his house burning down. There is an additional stress since her son has had to move in with her. According to the patient's sister who is representing her family's observation, they have noticed progressive short-term memory loss since the patient return in 2010. She's had more significant memory decline in the past 5 months particularly in the last 3-4 weeks. For example patient forgets short-term conversations, where she placed objects, and recently forgot where she parked her car at the mall, even though she parked in the same place she is thin parking for many years. The patient's family is concerned about her ability to drive safely. She lives with her husband and her son. She is able to maintain her personal activities of daily living (6), but is no longer able to cook independently while maintaining her finances [name] 4). Of note, she has an identical twin sister who does not have any memory problems.  REVIEW OF SYSTEMS: Full 14 system review of systems performed and notable only for: cold intolerance memory loss hallucinations confusion agitation snoring acting out dreams runny nose.     ALLERGIES: Allergies  Allergen Reactions  . Aricept [  Donepezil Hcl]   . Erythromycin Hives    HOME MEDICATIONS: Outpatient Encounter Prescriptions as of 02/10/2014  Medication Sig  . memantine (NAMENDA) 10 MG tablet Take 10 mg by mouth 2 (two) times daily.  Marland Kitchen. ACCU-CHEK SMARTVIEW test strip   . alendronate (FOSAMAX) 70 MG tablet Take 70 mg by mouth once a week. Take with  a full glass of water on an empty stomach.  Marland Kitchen. amLODipine (NORVASC) 10 MG tablet Take 1 tablet (10 mg total) by mouth daily.  Marland Kitchen. aspirin EC 81 MG tablet Take 81 mg by mouth daily.  . Calcium Carbonate-Vitamin D (CALTRATE 600+D) 600-400 MG-UNIT per tablet Take 1 tablet by mouth daily.  . cholecalciferol (VITAMIN D) 1000 UNITS tablet Take 1,000 Units by mouth daily.  . clorazepate (TRANXENE) 7.5 MG tablet Take 7.5 mg by mouth 2 (two) times daily. For anxiety,  . loratadine (CLARITIN) 10 MG tablet Take 10 mg by mouth daily.  . metFORMIN (GLUCOPHAGE) 500 MG tablet Take 500 mg by mouth 2 (two) times daily. Take one tab in the am and two in the pm.  . mirtazapine (REMERON) 15 MG tablet Take 15 mg by mouth at bedtime.   . pantoprazole (PROTONIX) 40 MG tablet Take 40 mg by mouth daily.  . rosuvastatin (CRESTOR) 20 MG tablet Take 20 mg by mouth at bedtime.  . sitaGLIPtin (JANUVIA) 100 MG tablet Take 100 mg by mouth daily.  . sodium chloride 1 G tablet Take 1 g by mouth 2 (two) times daily.  . [DISCONTINUED] NAMENDA XR 28 MG CP24 Take 1 capsule by mouth  daily    PAST MEDICAL HISTORY: Past Medical History  Diagnosis Date  . Diabetes mellitus   . Alzheimer disease     diagnosed 12/12  . Hypertension   . High cholesterol   . Migraine   . Anxiety and depression   . TIA (transient ischemic attack)   . Hyponatremia   . Vitamin D deficiency   . Esophageal reflux   . Osteoporosis   . SIADH (syndrome of inappropriate ADH production)   . Dementia     PAST SURGICAL HISTORY: Past Surgical History  Procedure Laterality Date  . Cesarean section    . Shoulder surgery  2004    FAMILY HISTORY: Family History  Problem Relation Age of Onset  . Stroke Mother   . COPD Mother   . Diabetes Sister   . Breast cancer Sister   . Fibromyalgia Sister   . Stroke Maternal Grandmother   . CVA Maternal Grandmother   . Heart attack Maternal Grandfather     SOCIAL HISTORY: History   Social History  .  Marital Status: Married    Spouse Name: N/A    Number of Children: 2  . Years of Education: 12th   Occupational History  . Retired    Social History Main Topics  . Smoking status: Never Smoker   . Smokeless tobacco: Not on file  . Alcohol Use: No  . Drug Use: No  . Sexual Activity: Not on file   Other Topics Concern  . Not on file   Social History Narrative   Pt lives at home with her spouse and son.   Caffeine Use: 2-3 cups of coffee daily.     PHYSICAL EXAM  Filed Vitals:   02/10/14 1044  BP: 156/83  Pulse: 86  Temp: 98.6 F (37 C)  TempSrc: Oral  Height: 4\' 11"  (1.499 m)  Weight: 123 lb 3.2 oz (55.883 kg)  Body mass index is 24.87 kg/(m^2).   MMSE - Mini Mental State Exam 02/10/2014  Orientation to time 2  Orientation to Place 5  Registration 3  Attention/ Calculation 1  Recall 0  Language- name 2 objects 2  Language- repeat 1  Language- follow 3 step command 2  Language- read & follow direction 1  Write a sentence 1  Copy design 0  Total score 18    GENERAL EXAM: Patient is in no distress; well developed, nourished and groomed; neck is supple  CARDIOVASCULAR: Regular rate and rhythm, no murmurs, no carotid bruits  NEUROLOGIC: MENTAL STATUS: awake, alert, language fluent, comprehension intact, naming intact, fund of knowledge appropriate; ORIENTED TO 2003, April, Monday THE 4TH; recalls 0/3. Cannot draw clock numbers.  CRANIAL NERVE: no papilledema on fundoscopic exam, pupils equal and reactive to light, visual fields full to confrontation, extraocular muscles intact, no nystagmus, facial sensation and strength symmetric, hearing intact, palate elevates symmetrically, uvula midline, shoulder shrug symmetric, tongue midline. MOTOR: normal bulk and tone, full strength in the BUE, BLE SENSORY: normal and symmetric to light touch COORDINATION: finger-nose-finger, fine finger movements normal REFLEXES: deep tendon reflexes present and  symmetric GAIT/STATION: narrow based gait; able to walk tandem; romberg is negative   DIAGNOSTIC DATA (LABS, IMAGING, TESTING) - I reviewed patient records, labs, notes, testing and imaging myself where available.  03/07/11: MRI BRAIN - Atrophy and chronic microvascular ischemia. No acute abnormality.   ASSESSMENT AND PLAN  74 y.o. right-handed female with hypertension, diabetes, hypercholesterolemia, depression, and TIA here with moderate dementia without behavioral disturbance on memantine. Previously tried aricept but stopped due to bradycardia noted during hospital stay. Memory loss is progressing.   PLAN: 1. Continue memantine  BID 2. Advised patient and spouse about home safety issues; informed of additional services that are available if they need in the future (home health, caregivers, adult enrichment/daycare, respite care)  Return if symptoms worsen or fail to improve, for return to PCP.   Suanne Marker, MD 02/10/2014, 11:36 AM Certified in Neurology, Neurophysiology and Neuroimaging  [facility] [address] [place], [place] [address] [phone]

## 2014-02-17 ENCOUNTER — Encounter: Payer: Self-pay | Admitting: Diagnostic Neuroimaging

## 2014-11-03 ENCOUNTER — Observation Stay (HOSPITAL_COMMUNITY): Payer: Medicare Other

## 2014-11-03 ENCOUNTER — Observation Stay (HOSPITAL_COMMUNITY)
Admission: EM | Admit: 2014-11-03 | Discharge: 2014-11-05 | Disposition: A | Discharge status: Home / Self Care | Payer: Medicare Other | Attending: Internal Medicine | Admitting: Internal Medicine

## 2014-11-03 ENCOUNTER — Encounter (HOSPITAL_COMMUNITY): Payer: Self-pay | Admitting: Emergency Medicine

## 2014-11-03 ENCOUNTER — Emergency Department (HOSPITAL_COMMUNITY): Payer: Medicare Other

## 2014-11-03 DIAGNOSIS — E871 Hypo-osmolality and hyponatremia: Secondary | ICD-10-CM | POA: Insufficient documentation

## 2014-11-03 DIAGNOSIS — E1165 Type 2 diabetes mellitus with hyperglycemia: Secondary | ICD-10-CM | POA: Insufficient documentation

## 2014-11-03 DIAGNOSIS — E785 Hyperlipidemia, unspecified: Secondary | ICD-10-CM | POA: Insufficient documentation

## 2014-11-03 DIAGNOSIS — F329 Major depressive disorder, single episode, unspecified: Secondary | ICD-10-CM

## 2014-11-03 DIAGNOSIS — R748 Abnormal levels of other serum enzymes: Secondary | ICD-10-CM | POA: Diagnosis present

## 2014-11-03 DIAGNOSIS — W19XXXA Unspecified fall, initial encounter: Secondary | ICD-10-CM | POA: Insufficient documentation

## 2014-11-03 DIAGNOSIS — M1990 Unspecified osteoarthritis, unspecified site: Secondary | ICD-10-CM | POA: Insufficient documentation

## 2014-11-03 DIAGNOSIS — Z7982 Long term (current) use of aspirin: Secondary | ICD-10-CM | POA: Insufficient documentation

## 2014-11-03 DIAGNOSIS — Z8673 Personal history of transient ischemic attack (TIA), and cerebral infarction without residual deficits: Secondary | ICD-10-CM | POA: Insufficient documentation

## 2014-11-03 DIAGNOSIS — IMO0002 Reserved for concepts with insufficient information to code with codable children: Secondary | ICD-10-CM | POA: Diagnosis present

## 2014-11-03 DIAGNOSIS — I639 Cerebral infarction, unspecified: Secondary | ICD-10-CM

## 2014-11-03 DIAGNOSIS — E7800 Pure hypercholesterolemia, unspecified: Secondary | ICD-10-CM

## 2014-11-03 DIAGNOSIS — F05 Delirium due to known physiological condition: Principal | ICD-10-CM | POA: Insufficient documentation

## 2014-11-03 DIAGNOSIS — Z7984 Long term (current) use of oral hypoglycemic drugs: Secondary | ICD-10-CM | POA: Insufficient documentation

## 2014-11-03 DIAGNOSIS — I10 Essential (primary) hypertension: Secondary | ICD-10-CM | POA: Insufficient documentation

## 2014-11-03 DIAGNOSIS — F32A Depression, unspecified: Secondary | ICD-10-CM

## 2014-11-03 DIAGNOSIS — M6282 Rhabdomyolysis: Secondary | ICD-10-CM | POA: Insufficient documentation

## 2014-11-03 DIAGNOSIS — E1100 Type 2 diabetes mellitus with hyperosmolarity without nonketotic hyperglycemic-hyperosmolar coma (NKHHC): Secondary | ICD-10-CM

## 2014-11-03 DIAGNOSIS — G459 Transient cerebral ischemic attack, unspecified: Secondary | ICD-10-CM | POA: Insufficient documentation

## 2014-11-03 DIAGNOSIS — F0280 Dementia in other diseases classified elsewhere without behavioral disturbance: Secondary | ICD-10-CM | POA: Insufficient documentation

## 2014-11-03 DIAGNOSIS — G309 Alzheimer's disease, unspecified: Secondary | ICD-10-CM | POA: Insufficient documentation

## 2014-11-03 LAB — I-STAT CHEM 8, ED
BUN: 9 mg/dL (ref 6–20)
Calcium, Ion: 1.15 mmol/L (ref 1.13–1.30)
Chloride: 92 mmol/L — ABNORMAL LOW (ref 101–111)
Creatinine, Ser: 0.6 mg/dL (ref 0.44–1.00)
Glucose, Bld: 348 mg/dL — ABNORMAL HIGH (ref 65–99)
HCT: 46 % (ref 36.0–46.0)
Hemoglobin: 15.6 g/dL — ABNORMAL HIGH (ref 12.0–15.0)
Potassium: 3.7 mmol/L (ref 3.5–5.1)
Sodium: 132 mmol/L — ABNORMAL LOW (ref 135–145)
TCO2: 21 mmol/L (ref 0–100)

## 2014-11-03 LAB — COMPREHENSIVE METABOLIC PANEL
ALT: 25 U/L (ref 14–54)
AST: 59 U/L — ABNORMAL HIGH (ref 15–41)
Albumin: 4.3 g/dL (ref 3.5–5.0)
Alkaline Phosphatase: 39 U/L (ref 38–126)
Anion gap: 16 — ABNORMAL HIGH (ref 5–15)
BUN: 7 mg/dL (ref 6–20)
CO2: 21 mmol/L — ABNORMAL LOW (ref 22–32)
Calcium: 9.5 mg/dL (ref 8.9–10.3)
Chloride: 94 mmol/L — ABNORMAL LOW (ref 101–111)
Creatinine, Ser: 0.83 mg/dL (ref 0.44–1.00)
GFR calc Af Amer: >60 mL/min (ref >60)
GFR calc non Af Amer: >60 mL/min (ref >60)
Glucose, Bld: 339 mg/dL — ABNORMAL HIGH (ref 65–99)
Potassium: 3.7 mmol/L (ref 3.5–5.1)
Sodium: 131 mmol/L — ABNORMAL LOW (ref 135–145)
Total Bilirubin: 1 mg/dL (ref 0.3–1.2)
Total Protein: 7.7 g/dL (ref 6.5–8.1)

## 2014-11-03 LAB — CBC
HCT: 40.7 % (ref 36.0–46.0)
Hemoglobin: 13.9 g/dL (ref 12.0–15.0)
MCH: 29.8 pg (ref 26.0–34.0)
MCHC: 34.2 g/dL (ref 30.0–36.0)
MCV: 87.2 fL (ref 78.0–100.0)
Platelets: 235 10*3/uL (ref 150–400)
RBC: 4.67 MIL/uL (ref 3.87–5.11)
RDW: 12.8 % (ref 11.5–15.5)
WBC: 13.9 10*3/uL — ABNORMAL HIGH (ref 4.0–10.5)

## 2014-11-03 LAB — ETHANOL: Alcohol, Ethyl (B): <5 mg/dL (ref <5)

## 2014-11-03 LAB — URINALYSIS, ROUTINE W REFLEX MICROSCOPIC
Bilirubin Urine: NEGATIVE
Glucose, UA: >1000 mg/dL — AB
Ketones, ur: 15 mg/dL — AB
Leukocytes, UA: NEGATIVE
Nitrite: NEGATIVE
Protein, ur: NEGATIVE mg/dL
Specific Gravity, Urine: 1.022 (ref 1.005–1.030)
Urobilinogen, UA: 0.2 mg/dL (ref 0.0–1.0)
pH: 6.5 (ref 5.0–8.0)

## 2014-11-03 LAB — GLUCOSE, CAPILLARY
Glucose-Capillary: 137 mg/dL — ABNORMAL HIGH (ref 65–99)
Glucose-Capillary: 142 mg/dL — ABNORMAL HIGH (ref 65–99)
Glucose-Capillary: 151 mg/dL — ABNORMAL HIGH (ref 65–99)

## 2014-11-03 LAB — RAPID URINE DRUG SCREEN, HOSP PERFORMED
Amphetamines: NOT DETECTED
Barbiturates: NOT DETECTED
Benzodiazepines: POSITIVE — AB
Cocaine: NOT DETECTED
Opiates: NOT DETECTED
Tetrahydrocannabinol: NOT DETECTED

## 2014-11-03 LAB — I-STAT TROPONIN, ED: Troponin i, poc: 0 ng/mL (ref 0.00–0.08)

## 2014-11-03 LAB — APTT: aPTT: 29 seconds (ref 24–37)

## 2014-11-03 LAB — URINE MICROSCOPIC-ADD ON

## 2014-11-03 LAB — DIFFERENTIAL
Basophils Absolute: 0 10*3/uL (ref 0.0–0.1)
Basophils Relative: 0 %
Eosinophils Absolute: 0 10*3/uL (ref 0.0–0.7)
Eosinophils Relative: 0 %
Lymphocytes Relative: 3 %
Lymphs Abs: 0.5 10*3/uL — ABNORMAL LOW (ref 0.7–4.0)
Monocytes Absolute: 0.7 10*3/uL (ref 0.1–1.0)
Monocytes Relative: 5 %
Neutro Abs: 12.8 10*3/uL — ABNORMAL HIGH (ref 1.7–7.7)
Neutrophils Relative %: 92 %

## 2014-11-03 LAB — PROTIME-INR
INR: 1.06 (ref 0.00–1.49)
Prothrombin Time: 14 seconds (ref 11.6–15.2)

## 2014-11-03 LAB — CK: Total CK: 1911 U/L — ABNORMAL HIGH (ref 38–234)

## 2014-11-03 MED ORDER — INSULIN ASPART 100 UNIT/ML ~~LOC~~ SOLN
0.0000 [IU] | SUBCUTANEOUS | Status: DC
  Administered 2014-11-03: 2 [IU] via SUBCUTANEOUS
  Administered 2014-11-03: 1 [IU] via SUBCUTANEOUS
  Administered 2014-11-04: 2 [IU] via SUBCUTANEOUS
  Administered 2014-11-04 (×2): 1 [IU] via SUBCUTANEOUS

## 2014-11-03 MED ORDER — SODIUM CHLORIDE 0.9 % IV BOLUS (SEPSIS)
500.0000 mL | Freq: Once | INTRAVENOUS | Status: AC
  Administered 2014-11-03: 500 mL via INTRAVENOUS

## 2014-11-03 MED ORDER — ASPIRIN EC 81 MG PO TBEC
81.0000 mg | DELAYED_RELEASE_TABLET | Freq: Every day | ORAL | Status: DC
  Administered 2014-11-03 – 2014-11-05 (×3): 81 mg via ORAL
  Filled 2014-11-03 (×3): qty 1

## 2014-11-03 MED ORDER — ROSUVASTATIN CALCIUM 20 MG PO TABS
20.0000 mg | ORAL_TABLET | Freq: Every day | ORAL | Status: DC
Start: 2014-11-03 — End: 2014-11-04
  Administered 2014-11-03: 20 mg via ORAL
  Filled 2014-11-03 (×2): qty 1

## 2014-11-03 MED ORDER — SENNOSIDES-DOCUSATE SODIUM 8.6-50 MG PO TABS: 1.0000 | ORAL_TABLET | Freq: Every evening | ORAL | Status: DC | PRN

## 2014-11-03 MED ORDER — MIRTAZAPINE 15 MG PO TABS
15.0000 mg | ORAL_TABLET | Freq: Every day | ORAL | Status: DC
  Administered 2014-11-03 – 2014-11-04 (×2): 15 mg via ORAL
  Filled 2014-11-03 (×2): qty 1

## 2014-11-03 MED ORDER — PANTOPRAZOLE SODIUM 40 MG PO TBEC
40.0000 mg | DELAYED_RELEASE_TABLET | Freq: Every day | ORAL | Status: DC
  Administered 2014-11-03 – 2014-11-05 (×3): 40 mg via ORAL
  Filled 2014-11-03 (×3): qty 1

## 2014-11-03 MED ORDER — MEMANTINE HCL 10 MG PO TABS
10.0000 mg | ORAL_TABLET | Freq: Two times a day (BID) | ORAL | Status: DC
  Administered 2014-11-03 – 2014-11-05 (×4): 10 mg via ORAL
  Filled 2014-11-03 (×5): qty 1

## 2014-11-03 MED ORDER — SODIUM CHLORIDE 0.9 % IV SOLN
INTRAVENOUS | Status: DC
  Administered 2014-11-03: 18:00:00 via INTRAVENOUS
  Administered 2014-11-05: 75 mL/h via INTRAVENOUS

## 2014-11-03 MED ORDER — STROKE: EARLY STAGES OF RECOVERY BOOK
Freq: Once | Status: AC
  Administered 2014-11-03: 17:00:00

## 2014-11-03 NOTE — ED Notes (Signed)
PA browning at bedside 

## 2014-11-03 NOTE — Progress Notes (Signed)
Patient arrived to 5M22 from ED accompanied by family. Safety precautions and orders reviewed. TELE applied and confirmed. No other distress noted. Will continue to monitor.   Shahida Schnackenberg. RN

## 2014-11-03 NOTE — ED Provider Notes (Signed)
CSN: [id number]     Arrival date & time 11/03/14  1027 History   First MD Initiated Contact with Patient 11/03/14 1028     Chief Complaint  Patient presents with  . Fall     (Consider location/radiation/quality/duration/timing/severity/associated sxs/prior Treatment) HPI Comments: Patient with past medical history of dementia, diabetes, hypertension, TIA, presents to the emergency department with chief complaint of fall. She was found down this morning by her family members. She was found face down on the ground in her bedroom. She states that she is uncertain how she got there. It was noted that she was incontinent for bowel and bladder. Currently, patient denies being in any pain. She denies headache, chest pain, shortness of breath, abdominal pain, or other symptoms. Per EMS, she was unable to stand up straight, but was able to tolerate weightbearing activity. EMS reports the patient acted like she was going to fall forward.  The history is provided by the patient and the EMS personnel. No language interpreter was used.    Past Medical History  Diagnosis Date  . Diabetes mellitus   . Alzheimer disease     diagnosed 12/12  . Hypertension   . High cholesterol   . Migraine   . Anxiety and depression   . TIA (transient ischemic attack)   . Hyponatremia   . Vitamin D deficiency   . Esophageal reflux   . Osteoporosis   . SIADH (syndrome of inappropriate ADH production) (HCC)   . Dementia    Past Surgical History  Procedure Laterality Date  . Cesarean section    . Shoulder surgery  2004   Family History  Problem Relation Age of Onset  . Stroke Mother   . COPD Mother   . Diabetes Sister   . Breast cancer Sister   . Fibromyalgia Sister   . Stroke Maternal Grandmother   . CVA Maternal Grandmother   . Heart attack Maternal Grandfather    Social History  Substance Use Topics  . Smoking status: Never Smoker   . Smokeless tobacco: None  . Alcohol Use: No   OB History     No data available     Review of Systems  Constitutional: Negative for fever and chills.  Respiratory: Negative for shortness of breath.   Cardiovascular: Negative for chest pain.  Gastrointestinal: Negative for nausea, vomiting, diarrhea and constipation.  Genitourinary: Negative for dysuria.  All other systems reviewed and are negative.     Allergies  Aricept and Erythromycin  Home Medications   Prior to Admission medications   Medication Sig Start Date End Date Taking? Authorizing Provider  ACCU-CHEK SMARTVIEW test strip  05/08/12   Historical Provider, MD  alendronate (FOSAMAX) 70 MG tablet Take 70 mg by mouth once a week. Take with a full glass of water on an empty stomach.    Historical Provider, MD  amLODipine (NORVASC) 10 MG tablet Take 1 tablet (10 mg total) by mouth daily. 03/10/11 03/09/12  Joseph ArtJessica U Vann, DO  aspirin EC 81 MG tablet Take 81 mg by mouth daily.    Historical Provider, MD  Calcium Carbonate-Vitamin D (CALTRATE 600+D) 600-400 MG-UNIT per tablet Take 1 tablet by mouth daily.    Historical Provider, MD  cholecalciferol (VITAMIN D) 1000 UNITS tablet Take 1,000 Units by mouth daily.    Historical Provider, MD  clorazepate (TRANXENE) 7.5 MG tablet Take 7.5 mg by mouth 2 (two) times daily. For anxiety,    Historical Provider, MD  loratadine (CLARITIN) 10 MG  tablet Take 10 mg by mouth daily.    Historical Provider, MD  memantine (NAMENDA) 10 MG tablet Take 10 mg by mouth 2 (two) times daily.    Historical Provider, MD  metFORMIN (GLUCOPHAGE) 500 MG tablet Take 500 mg by mouth 2 (two) times daily. Take one tab in the am and two in the pm. 07/02/12   Historical Provider, MD  mirtazapine (REMERON) 15 MG tablet Take 15 mg by mouth at bedtime.  07/02/12   Historical Provider, MD  pantoprazole (PROTONIX) 40 MG tablet Take 40 mg by mouth daily.    Historical Provider, MD  rosuvastatin (CRESTOR) 20 MG tablet Take 20 mg by mouth at bedtime.    Historical Provider, MD  sitaGLIPtin  (JANUVIA) 100 MG tablet Take 100 mg by mouth daily.    Historical Provider, MD  sodium chloride 1 G tablet Take 1 g by mouth 2 (two) times daily.    Historical Provider, MD   BP 156/64 mmHg  Pulse 88  Temp(Src) 97.7 F (36.5 C) (Oral)  Resp 20  SpO2 94% Physical Exam  Constitutional: She is oriented to person, place, and time. She appears well-developed and well-nourished.  HENT:  Head: Normocephalic and atraumatic.  Eyes: Conjunctivae and EOM are normal. Pupils are equal, round, and reactive to light.  Neck: Normal range of motion. Neck supple.  Cardiovascular: Normal rate and regular rhythm.  Exam reveals no gallop and no friction rub.   No murmur heard. Pulmonary/Chest: Effort normal and breath sounds normal. No respiratory distress. She has no wheezes. She has no rales. She exhibits no tenderness.  Abdominal: Soft. Bowel sounds are normal. She exhibits no distension and no mass. There is no tenderness. There is no rebound and no guarding.  Musculoskeletal: Normal range of motion. She exhibits no edema or tenderness.  Passive range of motion intact throughout  Neurological: She is alert and oriented to person, place, and time.  CN III-12 intact, speech is clear, no pronator drift, normal finger to nose on right, somewhat delayed finger to nose on left  Skin: Skin is warm and dry.  Mild bruises to right forearm  Psychiatric: She has a normal mood and affect. Her behavior is normal. Judgment and thought content normal.  Nursing note and vitals reviewed.   ED Course  Procedures  Results for orders placed or performed during the hospital encounter of 11/03/14  Ethanol  Result Value Ref Range   Alcohol, Ethyl (B) <5 <5 mg/dL  Protime-INR  Result Value Ref Range   Prothrombin Time 14.0 11.6 - 15.2 seconds   INR 1.06 0.00 - 1.49  APTT  Result Value Ref Range   aPTT 29 24 - 37 seconds  CBC  Result Value Ref Range   WBC 13.9 (H) 4.0 - 10.5 K/uL   RBC 4.67 3.87 - 5.11 MIL/uL    Hemoglobin 13.9 12.0 - 15.0 g/dL   HCT 16.1 09.6 - 04.5 %   MCV 87.2 78.0 - 100.0 fL   MCH 29.8 26.0 - 34.0 pg   MCHC 34.2 30.0 - 36.0 g/dL   RDW 40.9 81.1 - 91.4 %   Platelets 235 150 - 400 K/uL  Differential  Result Value Ref Range   Neutrophils Relative % 92 %   Neutro Abs 12.8 (H) 1.7 - 7.7 K/uL   Lymphocytes Relative 3 %   Lymphs Abs 0.5 (L) 0.7 - 4.0 K/uL   Monocytes Relative 5 %   Monocytes Absolute 0.7 0.1 - 1.0 K/uL  Eosinophils Relative 0 %   Eosinophils Absolute 0.0 0.0 - 0.7 K/uL   Basophils Relative 0 %   Basophils Absolute 0.0 0.0 - 0.1 K/uL  Comprehensive metabolic panel  Result Value Ref Range   Sodium 131 (L) 135 - 145 mmol/L   Potassium 3.7 3.5 - 5.1 mmol/L   Chloride 94 (L) 101 - 111 mmol/L   CO2 21 (L) 22 - 32 mmol/L   Glucose, Bld 339 (H) 65 - 99 mg/dL   BUN 7 6 - 20 mg/dL   Creatinine, Ser 4.09 0.44 - 1.00 mg/dL   Calcium 9.5 8.9 - 81.1 mg/dL   Total Protein 7.7 6.5 - 8.1 g/dL   Albumin 4.3 3.5 - 5.0 g/dL   AST 59 (H) 15 - 41 U/L   ALT 25 14 - 54 U/L   Alkaline Phosphatase 39 38 - 126 U/L   Total Bilirubin 1.0 0.3 - 1.2 mg/dL   GFR calc non Af Amer >60 >60 mL/min   GFR calc Af Amer >60 >60 mL/min   Anion gap 16 (H) 5 - 15  CK  Result Value Ref Range   Total CK 1911 (H) 38 - 234 U/L  I-Stat Chem 8, ED  (not at [facility], [facility])  Result Value Ref Range   Sodium 132 (L) 135 - 145 mmol/L   Potassium 3.7 3.5 - 5.1 mmol/L   Chloride 92 (L) 101 - 111 mmol/L   BUN 9 6 - 20 mg/dL   Creatinine, Ser 9.14 0.44 - 1.00 mg/dL   Glucose, Bld 782 (H) 65 - 99 mg/dL   Calcium, Ion 9.56 2.13 - 1.30 mmol/L   TCO2 21 0 - 100 mmol/L   Hemoglobin 15.6 (H) 12.0 - 15.0 g/dL   HCT 08.6 57.8 - 46.9 %  I-stat troponin, ED (not at [facility], [facility])  Result Value Ref Range   Troponin i, poc 0.00 0.00 - 0.08 ng/mL   Comment 3           Ct Head Wo Contrast  11/03/2014  CLINICAL DATA:  The patient was found down.  Ataxia. EXAM: CT HEAD WITHOUT CONTRAST TECHNIQUE: Contiguous axial  images were obtained from the base of the skull through the vertex without intravenous contrast. COMPARISON:  03/07/2011 FINDINGS: There is no acute intracranial hemorrhage, acute infarction, or intracranial mass lesion. There is an old left basal ganglia lacunar infarct. Slight periventricular white matter lucency consistent chronic small vessel ischemic disease. Diffuse cerebral cortical and cerebellar atrophy with secondary ventricular dilatation, slightly progressed since 2013. No osseous abnormality. IMPRESSION: No acute intracranial abnormality. 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 Progressive atrophy. Electronically Signed   By: Francene Boyers M.D.   On: 11/03/2014 11:53     I have personally reviewed and evaluated these images and lab results as part of my medical decision-making.   EKG Interpretation   Date/Time:  Monday November 03 2014 10:38:18 EDT Ventricular Rate:  86 PR Interval:  170 QRS Duration: 90 QT Interval:  403 QTC Calculation: 482 R Axis:   31 Text Interpretation:  Sinus rhythm Since last tracing rate faster  Confirmed by KNAPP  MD-J, JON ([id number]) on 11/03/2014 11:18:43 AM      MDM   Final diagnoses:  Fall, initial encounter    Patient found down this morning. We'll check basic labs. Concern for TIA/stroke.  CT is negative for acute stroke.  Patient seen by and discussed with Dr. Lynelle Doctor, who recommends admission for syncope/possible TIA workup.  Patient doesn't ambulate well in ED, but normally ambulates fine at home.  May benefit from an MRI.  Unknown why the patient fell in the first place.  Additionally, CK noted to be 1911.  I doubt rhabdo as kidney function is normal, but patient was found down on floor for unknown period of time.  Currently awake, alert, and oriented.  Appreciate [facility] for admitting the patient.    Roxy Horseman, PA-C 11/03/14 1314  Linwood Dibbles, MD 11/03/14 1321

## 2014-11-03 NOTE — H&P (Signed)
[facility] History and Physical  Gola Heminger WUJ:[id number] DOB: 1940-10-01 DOA: 11/03/2014  Referring physician: Emergency Department PCP: Allean Found, MD   CHIEF COMPLAINT: found on floor with loss of bladder / bowel control    HPI: Thecla Ferdinand is a 74 y.o. female with a medical history not limited to Alzheimer dementia. Patient has had progressive decline in cognitive abilities last few months. In the last few weeks she has been more confused, "talking about crazy stuff" per husband.   Because of temperature preferences patient sleeps in a different room than her husband. This morning patient found by husband on the floor beside her bed . Patient's feet were elevated on a close basket . Patient was breathing and has been was able to awaken her without too much difficulty. Patient was confused, more than her baseline. After a glass of juice her mental status improved . Patient has no recollection of the events.   ED COURSE:   vital signs are stable. Patient oriented to person and place but not time (thinks it's 1993). Labs:  Na 132, chloride 92 glucose 348. Hgb 15. Trop normal, CK 1911  Head CTscan - old infarcts.   EKG:  Vent. rate 86 BPM PR interval 170 ms QRS duration 90 ms QT/QTc 403/482 ms       Medications  sodium chloride 0.9 % bolus 500 mL (500 mLs Intravenous New Bag/Given 11/03/14 1243)    REVIEW OF SYSTEMS:  Unobtainable, patient confused  Past Medical History  Diagnosis Date  . Diabetes mellitus   . Alzheimer disease     diagnosed 12/12  . Hypertension   . High cholesterol   . Migraine   . Anxiety and depression   . TIA (transient ischemic attack)   . Hyponatremia   . Vitamin D deficiency   . Esophageal reflux   . Osteoporosis   . SIADH (syndrome of inappropriate ADH production) (HCC)   . Dementia    Past Surgical History  Procedure Laterality Date  . Cesarean section    . Shoulder surgery  2004   Social History:  reports that she  has never smoked. She does not have any smokeless tobacco history on file. She reports that she does not drink alcohol or use illicit drugs. Lives:  At home  With:  husband    Assistive devices: none needed for ambulation.   Allergies  Allergen Reactions  . Aricept [Donepezil Hcl]   . Erythromycin Hives    Family History  Problem Relation Age of Onset  . Stroke Mother   . COPD Mother   . Diabetes Sister   . Breast cancer Sister   . Fibromyalgia Sister   . Stroke Maternal Grandmother   . CVA Maternal Grandmother   . Heart attack Maternal Grandfather     Prior to Admission medications   Medication Sig Start Date End Date Taking? Authorizing Provider  ACCU-CHEK SMARTVIEW test strip  05/08/12   Historical Provider, MD  alendronate (FOSAMAX) 70 MG tablet Take 70 mg by mouth once a week. Take with a full glass of water on an empty stomach.    Historical Provider, MD  amLODipine (NORVASC) 10 MG tablet Take 1 tablet (10 mg total) by mouth daily. 03/10/11 03/09/12  Joseph Art, DO  aspirin EC 81 MG tablet Take 81 mg by mouth daily.    Historical Provider, MD  Calcium Carbonate-Vitamin D (CALTRATE 600+D) 600-400 MG-UNIT per tablet Take 1 tablet by mouth daily.    Historical Provider, MD  cholecalciferol (VITAMIN D) 1000 UNITS tablet Take 1,000 Units by mouth daily.    Historical Provider, MD  clorazepate (TRANXENE) 7.5 MG tablet Take 7.5 mg by mouth 2 (two) times daily. For anxiety,    Historical Provider, MD  loratadine (CLARITIN) 10 MG tablet Take 10 mg by mouth daily.    Historical Provider, MD  memantine (NAMENDA) 10 MG tablet Take 10 mg by mouth 2 (two) times daily.    Historical Provider, MD  metFORMIN (GLUCOPHAGE) 500 MG tablet Take 500 mg by mouth 2 (two) times daily. Take one tab in the am and two in the pm. 07/02/12   Historical Provider, MD  mirtazapine (REMERON) 15 MG tablet Take 15 mg by mouth at bedtime.  07/02/12   Historical Provider, MD  pantoprazole (PROTONIX) 40 MG tablet  Take 40 mg by mouth daily.    Historical Provider, MD  rosuvastatin (CRESTOR) 20 MG tablet Take 20 mg by mouth at bedtime.    Historical Provider, MD  sitaGLIPtin (JANUVIA) 100 MG tablet Take 100 mg by mouth daily.    Historical Provider, MD  sodium chloride 1 G tablet Take 1 g by mouth 2 (two) times daily.    Historical Provider, MD   PHYSICAL EXAM: Filed Vitals:   11/03/14 1040 11/03/14 1045 11/03/14 1230  BP: 156/64 134/71 169/87  Pulse: 88 84 78  Temp: 97.7 F (36.5 C)    TempSrc: Oral    Resp: 20 19   SpO2: 94% 96% 95%    Wt Readings from Last 3 Encounters:  02/10/14 55.883 kg (123 lb 3.2 oz)  07/16/12 60.328 kg (133 lb)  03/07/11 72.689 kg (160 lb 4 oz)    General:  Pleasant  thin white female. Appears calm and comfortable Eyes: PER, normal lids, irises & conjunctiva ENT: grossly normal hearing, lips & tongue Neck: no LAD, no masses Cardiovascular: RRR,No LE edema.  Respiratory: Respirations even and unlabored. Normal respiratory effort. Lungs CTA bilaterally, no wheezes / rales .   Abdomen: soft, non-distended, non-tender, active bowel sounds. No obvious masses.  Skin: no rash seen on limited exam. Skin dry, tenting Musculoskeletal: 4/5 LUE and LLE. 5/5 RUE and RLE Psychiatric: grossly normal mood and affect, speech fluent and appropriate Neurologic: not oriented to time. Memory loss. .         Labs on Admission:  Basic Metabolic Panel:  Recent Labs Lab 11/03/14 1100 11/03/14 1113  NA 131* 132*  K 3.7 3.7  CL 94* 92*  CO2 21*  --   GLUCOSE 339* 348*  BUN 7 9  CREATININE 0.83 0.60  CALCIUM 9.5  --    Liver Function Tests:  Recent Labs Lab 11/03/14 1100  AST 59*  ALT 25  ALKPHOS 39  BILITOT 1.0  PROT 7.7  ALBUMIN 4.3     CBC:  Recent Labs Lab 11/03/14 1100 11/03/14 1113  WBC 13.9*  --   NEUTROABS 12.8*  --   HGB 13.9 15.6*  HCT 40.7 46.0  MCV 87.2  --   PLT 235  --    Cardiac Enzymes:  Recent Labs Lab 11/03/14 1100  CKTOTAL  1911*     Radiological Exams on Admission: Ct Head Wo Contrast  11/03/2014  CLINICAL DATA:  The patient was found down.  Ataxia. EXAM: CT HEAD WITHOUT CONTRAST TECHNIQUE: Contiguous axial images were obtained from the base of the skull through the vertex without intravenous contrast. COMPARISON:  03/07/2011 FINDINGS: There is no acute intracranial hemorrhage, acute infarction, or intracranial  mass lesion. There is an old left basal ganglia lacunar infarct. Slight periventricular white matter lucency consistent chronic small vessel ischemic disease. Diffuse cerebral cortical and cerebellar atrophy with secondary ventricular dilatation, slightly progressed since 2013. No osseous abnormality. IMPRESSION: No acute intracranial abnormality. 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 Progressive atrophy. Electronically Signed   By: Francene Boyers M.D.   On: 11/03/2014 11:53     ASSESSMENT / PLAN    Fall at home. Unwitnessed fall off bed (less than 3 feet). Husband found her this am asleep on floor with legs elevated on clothes basket incontinent of urine. Husband able to awaken patient, she was more confused than baseline.  Patient has dementia and has no recollection of events. Needs workup for TIA/ CVA. Hypoglycemic event possible as well  (mental status improved after husband gave her juice). -Admit to telemetry for TIA / CVA evaluation -Obtain MRI / MRA of head.  -Obtain u/a -hold home anxiolytic -Consider EEG per Neurology's recommendation since she is at increased risk   for seizures. Marland Kitchen   Hx of TIAs / CVA. Today's CTscan shows an old left basal ganglia lacunar infarct not mentioned on head CTscan in 2013. She has mild left sided weakness on exam. Continue home ASA  Alzheimers dementia.  Last saw Neurologist in January. Dementia felt to be progressive.  Didn't tolerate Aricept secondary to bradycardia. Husband states patient has been unable to cook or drive for 2  years. Over the last 2 weeks patient has been "talking about crazy stuff".   Elevated CK. Likely secondary to fall. Normal renal function.   Diabetes mellitus type 2, uncontrolled. Glucose 348 today.  -Hold home oral diabetic agents.  -Check CBGs with SSI coverage. -Carb modified diet. -no HS insulin coverage for now   Hyponatremia. Pseudo-hyponatremia. Corrected sodium 136/    Hypertension. Stable. Hold home anti-hypertensives until CVA ruled out.    Consultants- Briefly discussed CT scan and case with Theodoro Grist, PA from Neurology   Code Status:  DNR DVT Prophylaxis: lovenox Family Communication:  Plan discussed with patient's sister and patient's husband in the room. They both agree with the plan.  Disposition Plan:  Home in 24-4 hours.    Time spent: 60 minutes Willette Cluster  NP [facility] Pager [phone]

## 2014-11-03 NOTE — ED Notes (Addendum)
Pt arrives via ptar, family called out for c/o of patient being found on the floor face down with loss of bladder and bowel. Pt has hx of dementia, reports she is unsure why she was on the floor. Pt was unable to stand upon family helping her up and family also reported that patient seemed more confused that normal. Pt alert and oriented to person, place and situation. No neuro deficits noted.

## 2014-11-03 NOTE — ED Notes (Signed)
Patient transported to CT 

## 2014-11-04 ENCOUNTER — Observation Stay (HOSPITAL_COMMUNITY): Payer: Medicare Other

## 2014-11-04 ENCOUNTER — Observation Stay (HOSPITAL_COMMUNITY)
Admit: 2014-11-04 | Discharge: 2014-11-04 | Disposition: A | Discharge status: Home / Self Care | Payer: Medicare Other | Attending: Internal Medicine | Admitting: Internal Medicine

## 2014-11-04 DIAGNOSIS — R748 Abnormal levels of other serum enzymes: Secondary | ICD-10-CM | POA: Diagnosis not present

## 2014-11-04 DIAGNOSIS — G309 Alzheimer's disease, unspecified: Secondary | ICD-10-CM

## 2014-11-04 DIAGNOSIS — F05 Delirium due to known physiological condition: Secondary | ICD-10-CM | POA: Diagnosis not present

## 2014-11-04 DIAGNOSIS — E871 Hypo-osmolality and hyponatremia: Secondary | ICD-10-CM

## 2014-11-04 DIAGNOSIS — I10 Essential (primary) hypertension: Secondary | ICD-10-CM | POA: Diagnosis not present

## 2014-11-04 DIAGNOSIS — E7800 Pure hypercholesterolemia, unspecified: Secondary | ICD-10-CM

## 2014-11-04 DIAGNOSIS — F0280 Dementia in other diseases classified elsewhere without behavioral disturbance: Secondary | ICD-10-CM

## 2014-11-04 LAB — LIPID PANEL
Cholesterol: 80 mg/dL (ref 0–200)
HDL: 37 mg/dL — ABNORMAL LOW (ref >40)
LDL Cholesterol: 31 mg/dL (ref 0–99)
Total CHOL/HDL Ratio: 2.2 RATIO
Triglycerides: 60 mg/dL (ref <150)
VLDL: 12 mg/dL (ref 0–40)

## 2014-11-04 LAB — GLUCOSE, CAPILLARY
Glucose-Capillary: 157 mg/dL — ABNORMAL HIGH (ref 65–99)
Glucose-Capillary: 145 mg/dL — ABNORMAL HIGH (ref 65–99)
Glucose-Capillary: 159 mg/dL — ABNORMAL HIGH (ref 65–99)
Glucose-Capillary: 216 mg/dL — ABNORMAL HIGH (ref 65–99)
Glucose-Capillary: 141 mg/dL — ABNORMAL HIGH (ref 65–99)

## 2014-11-04 LAB — CK
Total CK: 3869 U/L — ABNORMAL HIGH (ref 38–234)
Total CK: 4443 U/L — ABNORMAL HIGH (ref 38–234)

## 2014-11-04 MED ORDER — INSULIN ASPART 100 UNIT/ML ~~LOC~~ SOLN
0.0000 [IU] | Freq: Three times a day (TID) | SUBCUTANEOUS | Status: DC
  Administered 2014-11-04: 3 [IU] via SUBCUTANEOUS
  Administered 2014-11-04: 2 [IU] via SUBCUTANEOUS
  Administered 2014-11-05: 3 [IU] via SUBCUTANEOUS
  Administered 2014-11-05: 2 [IU] via SUBCUTANEOUS

## 2014-11-04 NOTE — Evaluation (Signed)
Physical Therapy Evaluation & Discharge Patient Details Name: Autumn Schneider MRN: [id number] DOB: 06/02/1940 Today's Date: 11/04/2014   History of Present Illness  pt is a 74 yo female [facility] after being found down at home. Pt has h/o dementia, reflux, osteoporosis, Vit D, TIA, HTN, migraine. Pt CT Pt resides with her spouse, whom is retired per pt's sister. MRI showed Tiny foci of chronic hemorrhage in the subcortical white matter.  Clinical Impression  Patient presents with LE weakness and general slowed gait due to fall with soreness in LE's.  Spouse and sister present and educated to assist with stairs and supervision for ambulation.  Spoke to rail for bed and other options if desired including toddler rail, grab rail or chair depending on if needs rail to sit up, to keep from falling out of bed or to alert caregiver if patient getting up.  Patient without ongoing PT needs at this time due to has necessary level of assist by spouse and mobility issues seem to be returning to baseline.    Follow Up Recommendations No PT follow up    Equipment Recommendations  None recommended by PT    Recommendations for Other Services       Precautions / Restrictions Precautions Precautions: Fall      Mobility  Bed Mobility Overal bed mobility: Needs Assistance Bed Mobility: Sit to Supine;Supine to Sit     Supine to sit: Supervision Sit to supine: Supervision   General bed mobility comments: cues to move to middle of bed after supine, cues for coming to edge of bed to sit; slower to respond  Transfers Overall transfer level: Modified independent                  Ambulation/Gait Ambulation/Gait assistance: Supervision Ambulation Distance (Feet): 150 Feet Assistive device: None Gait Pattern/deviations: Step-through pattern;Decreased stride length     General Gait Details: slower speed, cautious, but no LOB, supervision for safety  Stairs Stairs: Yes Stairs assistance: Min  guard Stair Management: One rail Right;Alternating pattern;Step to pattern;Forwards Number of Stairs: 3 General stair comments: assist with descent due to uncontrolled descent and some soreness in thighs following fall at home  Wheelchair Mobility    Modified Rankin (Stroke Patients Only)       Balance Overall balance assessment: Needs assistance         Standing balance support: No upper extremity supported Standing balance-Leahy Scale: Good Standing balance comment: ambulates slower, but no device                             Pertinent Vitals/Pain Pain Assessment: Faces Faces Pain Scale: Hurts little more Pain Location: soreness in thighs esp with stairs Pain Descriptors / Indicators: Aching Pain Intervention(s): Monitored during session    Home Living Family/patient expects to be discharged to:: Private residence Living Arrangements: Spouse/significant other;Children Available Help at Discharge: Family;Available 24 hours/day Type of Home: House Home Access: Stairs to enter Entrance Stairs-Rails: Right Entrance Stairs-Number of Steps: 3 Home Layout: One level Home Equipment: None      Prior Function Level of Independence: Needs assistance      ADL's / Homemaking Assistance Needed: assist for IADL's        Hand Dominance   Dominant Hand: Right    Extremity/Trunk Assessment   Upper Extremity Assessment: Overall WFL for tasks assessed           Lower Extremity Assessment: Generalized weakness  Communication      Cognition Arousal/Alertness: Awake/alert Behavior During Therapy: WFL for tasks assessed/performed Overall Cognitive Status: History of cognitive impairments - at baseline                      General Comments      Exercises        Assessment/Plan    PT Assessment Patent does not need any further PT services  PT Diagnosis Generalized weakness   PT Problem List    PT Treatment Interventions      PT Goals (Current goals can be found in the Care Plan section) Acute Rehab PT Goals PT Goal Formulation: All assessment and education complete, DC therapy    Frequency     Barriers to discharge        Co-evaluation               End of Session Equipment Utilized During Treatment: Gait belt Activity Tolerance: Patient tolerated treatment well Patient left: in chair;with family/visitor present      Functional Assessment Tool Used: Clinical Judgement Functional Limitation: Mobility: Walking and moving around Mobility: Walking and Moving Around Current Status (Z6109): At least 1 percent but less than 20 percent impaired, limited or restricted Mobility: Walking and Moving Around Goal Status [phone]): At least 1 percent but less than 20 percent impaired, limited or restricted Mobility: Walking and Moving Around Discharge Status [phone]): At least 1 percent but less than 20 percent impaired, limited or restricted    Time: 1127-1154 PT Time Calculation (min) (ACUTE ONLY): 27 min   Charges:   PT Evaluation $Initial PT Evaluation Tier I: 1 Procedure PT Treatments $Gait Training: 8-22 mins   PT G Codes:   PT G-Codes **NOT FOR INPATIENT CLASS** Functional Assessment Tool Used: Clinical Judgement Functional Limitation: Mobility: Walking and moving around Mobility: Walking and Moving Around Current Status (B1478): At least 1 percent but less than 20 percent impaired, limited or restricted Mobility: Walking and Moving Around Goal Status [phone]): At least 1 percent but less than 20 percent impaired, limited or restricted Mobility: Walking and Moving Around Discharge Status [phone]): At least 1 percent but less than 20 percent impaired, limited or restricted    [facility] 11/04/2014, 12:17 PM  Sheran Lawless, [place] [phone] 11/04/2014

## 2014-11-04 NOTE — Evaluation (Addendum)
Speech Language Pathology Evaluation Patient Details Name: Autumn Schneider MRN: [id number] DOB: 12-Feb-1940 Today's Date: 11/04/2014 Time: 4098-1191 SLP Time Calculation (min) (ACUTE ONLY): 35 min  Problem List:  Patient Active Problem List   Diagnosis Date Noted  . Fall 11/03/2014  . Diabetes mellitus type 2, uncontrolled (HCC) 11/03/2014  . Alzheimer's dementia 11/03/2014  . Depression 11/03/2014  . Hypercholesteremia 11/03/2014  . Elevated CK 11/03/2014  . TIA (transient ischemic attack) 03/07/2011  . Hyponatremia 03/07/2011  . EOSINOPHILIA 03/13/2008  . PRURITUS 02/21/2008  . DIABETES, TYPE 2 05/17/2007  . Hypertension 05/17/2007  . EUSTACHIAN TUBE DYSFUNCTION 05/01/2007  . LARYNGITIS, ACUTE 05/01/2007  . ALLERGIC RHINITIS 05/01/2007   Past Medical History:  Past Medical History  Diagnosis Date  . Diabetes mellitus   . Alzheimer disease     diagnosed 12/12  . Hypertension   . High cholesterol   . Migraine   . Anxiety and depression   . TIA (transient ischemic attack)   . Hyponatremia   . Vitamin D deficiency   . Esophageal reflux   . Osteoporosis   . SIADH (syndrome of inappropriate ADH production) (HCC)   . Dementia    Past Surgical History:  Past Surgical History  Procedure Laterality Date  . Cesarean section    . Shoulder surgery  2004   HPI:  pt is a 74 yo female adm to [facility] after being found down at home.  Pt has h/o dementia, reflux, osteoporosis, Vit D, TIA, HTN, migraine.  Pt CT Pt resides with her spouse, whom is retired per pt's sister. MRI showed Tiny foci of chronic hemorrhage in the subcortical white matter.  Speech/language evaluation ordered.     Assessment / Plan / Recommendation Clinical Impression  Pt was oriented to person, place but not to details of situation.  She is able to articulate her needs.  Sister present but does not know if pt is at baseline cognitive function.  Evidence of language of confusion/attention deficits with pt demonstrating  difficulty transitioning topics - eg: stating she could "washcloth" to call for assistance - sister had just given pt washcloth to clean hands before meal.  Halting speech noted suspect due to word finding deficits.  Pt did not articulate need to use call bell for assist within 5 minutes of explanation.  Advised sister Thomasene Lot to pt benefiting from 24/7 supervision due to cognitive deficits.   Furhter advised sister to have pt use the call bell to summon assistance, even with sister present.   SLP to follow up to establish baseline with spouse and help with compenstion strategy creation to mitigate cognitive deficits.        SLP Assessment  Patient needs continued Speech Lanaguage Pathology Services    Follow Up Recommendations  24 hour supervision/assistance    Frequency and Duration min 1 x/week  1 week   Pertinent Vitals/Pain Pain Assessment: No/denies pain   SLP Goals  Patient/Family Stated Goal: none stated Potential to Achieve Goals (ACUTE ONLY): Fair Potential Considerations (ACUTE ONLY): Previous level of function;Severity of impairments;Ability to learn/carryover information;Co-morbidities  SLP Evaluation Prior Functioning  Cognitive/Linguistic Baseline: Baseline deficits  Lives With: Spouse Available Help at Discharge: Family Vocation: Retired   IT consultant  Overall Cognitive Status: Difficult to assess (cognitive deficits, sister does not know if pt is at baseline re: cognitive skills) Orientation Level: Oriented to person;Oriented to place;Disoriented to situation Attention: Focused;Sustained Focused Attention: Appears intact Sustained Attention: Impaired Sustained Attention Impairment: Verbal basic;Functional basic Memory: Impaired Memory Impairment:  Storage deficit;Retrieval deficit;Decreased recall of new information;Decreased short term memory;Prospective memory Decreased Long Term Memory:  (pt recalled home address) Decreased Short Term Memory: Functional basic (for use  of call bell) Awareness: Impaired Awareness Impairment: Intellectual impairment (pt states she can get up on her own) Problem Solving: Impaired Problem Solving Impairment: Verbal basic;Functional basic Safety/Judgment: Impaired    Comprehension  Auditory Comprehension Overall Auditory Comprehension: Impaired Yes/No Questions: Not tested Commands: Impaired One Step Basic Commands: 75-100% accurate Two Step Basic Commands: 50-74% accurate Conversation: Simple Interfering Components: Attention;Processing speed;Working [employer] Service fast streamermemory;Motor planning;Visual impairments Visual Recognition/Discrimination Discrimination: Not tested Reading Comprehension Reading Status:  (read single word to identify animal correctly with choice of 2)    Expression Expression Primary Mode of Expression: Verbal Verbal Expression Overall Verbal Expression: Impaired at baseline Initiation: No impairment Level of Generative/Spontaneous Verbalization: Sentence;Phrase Repetition:  (DNT) Naming: Impairment Confrontation: Impaired Other Naming Comments: pt named 1/3 animals correctly- corrected 1 with written cue Pragmatics: No impairment Interfering Components: Premorbid deficit Effective Techniques: Written cues Other Verbal Expression Comments: pt with evidence of language of confusion with difficulty transitioning topics - eg: stating she could "washcloth" to call for assistance - sister had just given pt washcloth to clean hands before meal, halting speech noted suspect due to word finding deficits but pt able to express basic needs  Written Expression Dominant Hand: Right Written Expression: Not tested   Oral / Motor Oral Motor/Sensory Function Overall Oral Motor/Sensory Function: Appears within functional limits for tasks assessed Motor Speech Overall Motor Speech: Impaired Respiration: Within functional limits Resonance: Within functional limits Articulation: Within functional limitis Intelligibility:  Intelligible Interfering Components: Premorbid status   GO Functional Assessment Tool Used: clinical judgement Functional Limitations: Spoken language expressive Spoken Language Expression Current Status (Z6109(G9162): At least 60 percent but less than 80 percent impaired, limited or restricted Spoken Language Expression Goal Status [phone]([id number]): At least 40 percent but less than 60 percent impaired, limited or restricted   Mills KollerKimball, Ellan Tess Ann Houston Zapien, MS [facility] SLP [phone]

## 2014-11-04 NOTE — Procedures (Signed)
EEG report.  Brief clinical history: 74 y.o. female with a medical history not limited to Alzheimer dementia. Patient has had progressive decline in cognitive abilities last few months. In the last few weeks she has been more confused, "talking about crazy stuff" per husband.  Because of temperature preferences patient sleeps in a different room than her husband. This morning patient found by husband on the floor beside her bed . Patient's feet were elevated on a close basket . Patient was breathing and has been was able to awaken her without too much difficulty. Patient was confused, more than her baseline. After a glass of juice her mental status improved . Patient has no recollection of the events.   Technique: this is a 17 channel routine scalp EEG performed at the bedside with bipolar and monopolar montages arranged in accordance to the international 10/20 system of electrode placement. One channel was dedicated to EKG recording.  The study was performed during wakefulness, drowsiness, and stage 2 sleep. No activating procedures performed.  Description:In the wakeful state, the best background consisted of a medium amplitude, posterior dominant, well sustained, symmetric and reactive 8 Hz rhythm. Drowsiness demonstrated dropout of the alpha rhythm. Stage 2 sleep showed symmetric and synchronous sleep spindles without intermixed epileptiform discharges. No focal or generalized epileptiform discharges noted.  No pathologic areas of slowing seen.  EKG showed sinus rhythm.   Impression: this is a normal awake and asleep EEG. Please, be aware that a normal EEG does not exclude the possibility of epilepsy.  Clinical correlation is advised.   Wyatt Portelasvaldo Camilo, MD [facility]

## 2014-11-04 NOTE — Discharge Summary (Addendum)
PATIENT DETAILS Name: Autumn Schneider Age: 74 y.o. Sex: female Date of Birth: Jun 11, 1940 MRN: [id number]. Admitting Physician: Clydie Braun, MD JXB:JYNWG,NFAOZHY Venetia Constable, MD  Admit Date: 11/03/2014 Discharge date: 11/05/2014  Recommendations for Outpatient Follow-up:  1. Age appropriate general health maintenance  2. May need to involve palliative care at some point.  3. Temporarily discontinued Januvia-resume at next visit if no obvious signs of hypoglycemia 4. Statin on hold-resume when Rhabdomyolysis has completely resolved. 5. Please check CK levels at next visit-this improved-restart Crestor  PRIMARY DISCHARGE DIAGNOSIS:  Active Problems:   Hypertension   Hyponatremia   Fall   Diabetes mellitus type 2, uncontrolled (HCC)   Alzheimer's dementia   Depression   Hypercholesteremia   Elevated CK      PAST MEDICAL HISTORY: Past Medical History  Diagnosis Date  . Diabetes mellitus   . Alzheimer disease     diagnosed 12/12  . Hypertension   . High cholesterol   . Migraine   . Anxiety and depression   . TIA (transient ischemic attack)   . Hyponatremia   . Vitamin D deficiency   . Esophageal reflux   . Osteoporosis   . SIADH (syndrome of inappropriate ADH production) (HCC)   . Dementia     DISCHARGE MEDICATIONS: Current Discharge Medication List    CONTINUE these medications which have NOT CHANGED   Details  ACCU-CHEK SMARTVIEW test strip     alendronate (FOSAMAX) 70 MG tablet Take 70 mg by mouth once a week. Take with a full glass of water on an empty stomach.    amLODipine (NORVASC) 10 MG tablet Take 10 mg by mouth daily.    aspirin EC 81 MG tablet Take 81 mg by mouth daily.    Calcium Carbonate-Vitamin D (CALTRATE 600+D) 600-400 MG-UNIT per tablet Take 1 tablet by mouth daily.    cholecalciferol (VITAMIN D) 1000 UNITS tablet Take 1,000 Units by mouth daily.    clorazepate (TRANXENE) 7.5 MG tablet Take 7.5 mg by mouth 2 (two) times daily. For anxiety,    loratadine (CLARITIN) 10 MG tablet Take 10 mg by mouth daily.    memantine (NAMENDA) 10 MG tablet Take 10 mg by mouth 2 (two) times daily.    metFORMIN (GLUCOPHAGE) 500 MG tablet Take 1,000 mg by mouth 2 (two) times daily.     mirtazapine (REMERON) 15 MG tablet Take 15 mg by mouth at bedtime.     pantoprazole (PROTONIX) 40 MG tablet Take 40 mg by mouth daily.    sodium chloride 1 G tablet Take 1 g by mouth 2 (two) times daily.      STOP taking these medications     rosuvastatin (CRESTOR) 20 MG tablet      sitaGLIPtin (JANUVIA) 100 MG tablet         ALLERGIES:   Allergies  Allergen Reactions  . Aricept [Donepezil Hcl]     Doesn't remember  . Erythromycin Hives    BRIEF HPI:  See H&P, Labs, Consult and Test reports for all details in brief, patient was admitted for **  CONSULTATIONS:   None  PERTINENT RADIOLOGIC STUDIES: Dg Chest 1 View  11/03/2014  CLINICAL DATA:  Altered mental status and stroke symptoms for 1 day, diabetes mellitus, Alzheimer's, hypertension EXAM: CHEST 1 VIEW COMPARISON:  Portable exam 1750 hours compared to 03/07/2011 FINDINGS: Normal heart size, mediastinal contours and pulmonary vascularity. Atherosclerotic calcification aortic arch. Lungs clear. No pleural effusion or pneumothorax. Bones mildly demineralized with mild scattered endplate spur  formation thoracic spine. Sclerotic focus proximal LEFT humerus question enchondroma versus old bone infarct. IMPRESSION: No acute abnormalities. Electronically Signed   By: Ulyses SouthwardMark  Boles M.D.   On: 11/03/2014 18:26   Ct Head Wo Contrast  11/03/2014  CLINICAL DATA:  The patient was found down.  Ataxia. EXAM: CT HEAD WITHOUT CONTRAST TECHNIQUE: Contiguous axial images were obtained from the base of the skull through the vertex without intravenous contrast. COMPARISON:  03/07/2011 FINDINGS: There is no acute intracranial hemorrhage, acute infarction, or intracranial mass lesion. There is an old left basal ganglia  lacunar infarct. Slight periventricular white matter lucency consistent chronic small vessel ischemic disease. Diffuse cerebral cortical and cerebellar atrophy with secondary ventricular dilatation, slightly progressed since 2013. No osseous abnormality. IMPRESSION: No acute intracranial abnormality. 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 Progressive atrophy. Electronically Signed   By: Francene BoyersJames  Maxwell M.D.   On: 11/03/2014 11:53   Mr Brain Wo Contrast  11/03/2014  CLINICAL DATA:  Dementia. Found on floor face down with loss of bladder and bowel. Increasing confusion. No neuro deficits are reported. Initial encounter. EXAM: MRI HEAD WITHOUT CONTRAST MRA HEAD WITHOUT CONTRAST TECHNIQUE: Multiplanar, multiecho pulse sequences of the brain and surrounding structures were obtained without intravenous contrast. Angiographic images of the head were obtained using MRA technique without contrast. COMPARISON:  CT head earlier today.  MR head 02/1911. FINDINGS: MRI HEAD FINDINGS No areas of clearly restricted diffusion can be identified on diffusion-weighted imaging. A few small foci of increased signal on DWI appear facilitated, and correlate with significant white matter disease and/or susceptibility. There is advanced atrophy. Extensive chronic microvascular ischemic change is noted. There are scattered areas of lacunar infarction, most notable in the LEFT centrum semiovale. There also small foci of chronic hemorrhage throughout the subcortical white matter, likely sequelae of hypertension. Flow voids are maintained. No midline abnormalities. Extracranial soft tissues unremarkable. MRA HEAD FINDINGS LEFT internal carotid artery widely patent. 75-90% stenosis distal cavernous/ supraclinoid ICA on the RIGHT. BILATERAL ICA termini widely patent. Basilar artery widely patent with RIGHT vertebral dominant. Diminutive LEFT vertebral distal to the LEFT PICA origin. No significant A1, or M1  stenosis. Mild irregularity proximal LEFT P1, not flow reducing. No cerebellar branch occlusion or intracranial aneurysm. Compared with 2013 priors, the RIGHT ICA stenosis has progressed. Atrophy and small vessel disease as well as small microbleeds have also progressed. IMPRESSION: No acute intracranial findings are observed. Advanced atrophy and chronic ischemic changes described, progressive since 2013. 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 Dominant RIGHT vertebral. No other similar stenoses of the intracranial medium or large-sized vessels. Electronically Signed   By: Elsie StainJohn T Curnes M.D.   On: 11/03/2014 19:35   Mr Maxine GlennMra Head/brain Wo Cm  11/03/2014  CLINICAL DATA:  Dementia. Found on floor face down with loss of bladder and bowel. Increasing confusion. No neuro deficits are reported. Initial encounter. EXAM: MRI HEAD WITHOUT CONTRAST MRA HEAD WITHOUT CONTRAST TECHNIQUE: Multiplanar, multiecho pulse sequences of the brain and surrounding structures were obtained without intravenous contrast. Angiographic images of the head were obtained using MRA technique without contrast. COMPARISON:  CT head earlier today.  MR head 02/1911. FINDINGS: MRI HEAD FINDINGS No areas of clearly restricted diffusion can be identified on diffusion-weighted imaging. A few small foci of increased signal on DWI appear facilitated, and correlate with significant  white matter disease and/or susceptibility. There is advanced atrophy. Extensive chronic microvascular ischemic change is noted. There are scattered areas of lacunar infarction, most notable in the LEFT centrum semiovale. There also small foci of chronic hemorrhage throughout the subcortical white matter, likely sequelae of hypertension. Flow voids are maintained. No midline  abnormalities. Extracranial soft tissues unremarkable. MRA HEAD FINDINGS LEFT internal carotid artery widely patent. 75-90% stenosis distal cavernous/ supraclinoid ICA on the RIGHT. BILATERAL ICA termini widely patent. Basilar artery widely patent with RIGHT vertebral dominant. Diminutive LEFT vertebral distal to the LEFT PICA origin. No significant A1, or M1 stenosis. Mild irregularity proximal LEFT P1, not flow reducing. No cerebellar branch occlusion or intracranial aneurysm. Compared with 2013 priors, the RIGHT ICA stenosis has progressed. Atrophy and small vessel disease as well as small microbleeds have also progressed. IMPRESSION: No acute intracranial findings are observed. Advanced atrophy and chronic ischemic changes described, progressive since 2013. 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 Dominant RIGHT vertebral. No other similar stenoses of the intracranial medium or large-sized vessels. Electronically Signed   By: Elsie Stain M.D.   On: 11/03/2014 19:35     PERTINENT LAB RESULTS: CBC:  Recent Labs  11/03/14 1100 11/03/14 1113  WBC 13.9*  --   HGB 13.9 15.6*  HCT 40.7 46.0  PLT 235  --    CMET CMP     Component Value Date/Time   NA 136 11/05/2014 0522   K 3.9 11/05/2014 0522   CL 101 11/05/2014 0522   CO2 26 11/05/2014 0522   GLUCOSE 166* 11/05/2014 0522   BUN <5* 11/05/2014 0522   CREATININE 0.69 11/05/2014 0522   CALCIUM 8.6* 11/05/2014 0522   PROT 7.7 11/03/2014 1100   ALBUMIN 4.3 11/03/2014 1100   AST 59* 11/03/2014 1100   ALT 25 11/03/2014 1100   ALKPHOS 39 11/03/2014 1100   BILITOT 1.0 11/03/2014 1100   GFRNONAA >60 11/05/2014 0522   GFRAA >60 11/05/2014 0522    GFR CrCl cannot be calculated (Unknown ideal weight.). No results for input(s): LIPASE, AMYLASE  in the last 72 hours.  Recent Labs  11/04/14 1606 11/04/14 1853 11/05/14 0522  CKTOTAL 3869* 4443* 2793*   Invalid input(s): POCBNP No results for input(s): DDIMER in the last 72 hours.  Recent Labs  11/04/14 0515  HGBA1C 7.6*    Recent Labs  11/04/14 0515  CHOL 80  HDL 37*  LDLCALC 31  TRIG 60  CHOLHDL 2.2   No results for input(s): TSH, T4TOTAL, T3FREE, THYROIDAB in the last 72 hours.  Invalid input(s): FREET3 No results for input(s): VITAMINB12, FOLATE, FERRITIN, TIBC, IRON, RETICCTPCT in the last 72 hours. Coags:  Recent Labs  11/03/14 1100  INR 1.06   Microbiology: No results found for this or any previous visit (from the past 240 hour(s)).   BRIEF HOSPITAL COURSE:   Active Problems: Delirium: Patient was found near her bed-on the floor by her husband- she was thought to be more confused than her usual baseline. She was subsequently brought to the emergency room and admitted for further evaluation and treatment patient has advanced dementia and has no recollection of any of the events preceding her hospitalization. She was admitted and underwent workup. UA/chest x-ray were negative for UTI and pneumonia. MRI brain was negative for acute CVA. EEG brain was negative for seizure activity. Per husband,  patient is back to her baseline. They had been no changes to any of her medications. She does have a history of diabetes-but her husband-when EMS came to her house-her sugars were around 300. Etiology of delirium/fall unknown-but doubt if further workup is required in this setting-of advanced dementia. She could have had hypoglycemia-however metformin and Januvia are not known to cause hypoglycemia. In any event-Will discontinue Januvia for now and keep on metformin. Family is anxious to take her home-she has 24/7 care at home. She was seen by physical therapy services-ambulated in the hallway without difficulty. I have asked the patient's husband to continue to keep a  close eye on the patient-if any new events occur-to seek immediate medical attention  Dementia: Continue-Namenda. Follow-up with PCP. Per husband/sister at bedside-has had significant decline in the past few months.  Mild rhabdomyolysis: Resolving-CK down to 2793--electrolytes stable. Patient anxious to be discharged home-suspect okay to discharge home-have asked husband to show patient stays well hydrated.  Hold Crestor until seen by PCP.  Diabetes: Continue metformin-holding Januvia for now-see above  Hypertension: Continue with amlodipine on discharge.  Mild hyponatremia: Appears to be chronic. Sodium significantly improved and much better than usual baseline  TODAY-DAY OF DISCHARGE:  Subjective:   Donne Topete today has no headache,no chest abdominal pain,no new weakness tingling or numbness-she however remains pleasantly confused  Objective:   Blood pressure 155/60, pulse 62, temperature 98.1 F (36.7 C), temperature source Oral, resp. rate 20, SpO2 98 %.  Intake/Output Summary (Last 24 hours) at 11/05/14 1424 Last data filed at 11/05/14 0849  Gross per 24 hour  Intake 408.33 ml  Output      0 ml  Net 408.33 ml   There were no vitals filed for this visit.  Exam Awake Alert, Oriented *3, No new F.N deficits, Normal affect [place].AT,PERRAL Supple Neck,No JVD, No cervical lymphadenopathy appriciated.  Symmetrical Chest wall movement, Good air movement bilaterally, CTAB RRR,No Gallops,Rubs or new Murmurs, No Parasternal Heave +ve B.Sounds, Abd Soft, Non tender, No organomegaly appriciated, No rebound -guarding or rigidity. No Cyanosis, Clubbing or edema, No new Rash or bruise  DISCHARGE CONDITION: Stable  DISPOSITION: Home  DISCHARGE INSTRUCTIONS:    Activity:  As tolerated with Full fall precautions use walker/cane & assistance as needed  Get Medicines reviewed and adjusted: Please take all your medications with you for your next visit with your Primary MD  Please  request your Primary MD to go over all hospital tests and procedure/radiological results at the follow up, please ask your Primary MD to get all Hospital records sent to his/her office.  If you experience worsening of your admission symptoms, develop shortness of breath, life threatening emergency, suicidal or homicidal thoughts you must seek medical attention immediately by calling 911 or calling your MD immediately  if symptoms less severe.  You must read complete instructions/literature along with all the possible adverse reactions/side effects for all the Medicines you take and that have been prescribed to you. Take any new Medicines after you have completely understood and accpet all the possible adverse reactions/side effects.   Do not drive when taking Pain medications.   Do not take more than prescribed Pain, Sleep and Anxiety Medications  Special Instructions: If you have smoked or chewed Tobacco  in the last 2 yrs please stop smoking, stop any regular Alcohol  and or any Recreational drug use.  Wear Seat belts while driving.  Please note  You were cared for by a hospitalist during your hospital  stay. Once you are discharged, your primary care physician will handle any further medical issues. Please note that NO REFILLS for any discharge medications will be authorized once you are discharged, as it is imperative that you return to your primary care physician (or establish a relationship with a primary care physician if you do not have one) for your aftercare needs so that they can reassess your need for medications and monitor your lab values.   Diet recommendation: Diabetic Diet Heart Healthy diet  Discharge Instructions    Call MD for:  persistant dizziness or light-headedness    Complete by:  As directed      Diet - low sodium heart healthy    Complete by:  As directed      Diet Carb Modified    Complete by:  As directed      Increase activity slowly    Complete by:  As  directed            Follow-up Information    Follow up with Allean Found, MD. Schedule an appointment as soon as possible for a visit on 11/11/2014.   Specialty:  Family Medicine   Why:  [facility] SCHEDULED APPT 11/11/14 :15PM   Contact information:   [address] [place] [address] [phone]       Total Time spent on discharge equals 45 minutes.  SignedJeoffrey Massed 11/05/2014 2:24 PM

## 2014-11-04 NOTE — Care Management Note (Signed)
Case Management Note  Patient Details  Name: Autumn Schneider MRN: [id number] Date of Birth: 05/26/1940  Subjective/Objective:                    Action/Plan: Patient was admitted s/p fall. Lives at home with spouse.  Orders received for a 3N1 for discharge. [facility] was notified of need for equipment prior to discharge home.  Expected Discharge Date:                  Expected Discharge Plan:  Home/Self Care  In-House Referral:     Discharge planning Services  CM Consult  Post Acute Care Choice:    Choice offered to:  Patient  DME Arranged:  3-N-1 DME Agency:  [facility]  HH Arranged:    HH Agency:     Status of Service:  Completed, signed off  Medicare Important Message Given:    Date Medicare IM Given:    Medicare IM give by:    Date Additional Medicare IM Given:    Additional Medicare Important Message give by:     If discussed at Long Length of Stay Meetings, dates discussed:    Additional Comments:  Anda KraftRobarge, Adya Wirz C, RN 11/04/2014, 2:35 PM

## 2014-11-04 NOTE — Progress Notes (Signed)
Occupational Therapy Evaluation Patient Details Name: Autumn Schneider MRN: [id number] DOB: 10/24/1940 Today's Date: 11/04/2014    History of Present Illness pt is a 74 yo female [facility] after being found down at home. Pt has h/o dementia, reflux, osteoporosis, Vit D, TIA, HTN, migraine. Pt CT Pt resides with her spouse, whom is retired per pt's sister. MRI showed Tiny foci of chronic hemorrhage in the subcortical white matter.   Clinical Impression   Pt admitted with the above diagnoses. PTA pt was min A with bathing and dressing and shower transfers. Per family report pt is at baseline with ADLs. Pt did need light min A transferring from regular height toilet. Recommend 3n1 for over toilet at home to facilitate safety with toilet transfers. Pt mod I with sit<>stand from recliner surface. No further acute OT needs at this time OT signing off.       Follow Up Recommendations  Supervision/Assistance - 24 hour;No OT follow up    Equipment Recommendations  3 in 1 bedside comode    Recommendations for Other Services       Precautions / Restrictions Precautions Precautions: Fall      Mobility Bed Mobility Overal bed mobility: Needs Assistance Bed Mobility: Sit to Supine;Supine to Sit     Supine to sit: Supervision Sit to supine: Supervision   General bed mobility comments: in recliner  Transfers Overall transfer level: Needs assistance Equipment used: None Transfers: Sit to/from Stand Sit to Stand: Modified independent (Device/Increase time);Min assist         General transfer comment: mod I from recliner; light min A from regular height toilet    Balance Overall balance assessment: Needs assistance         Standing balance support: No upper extremity supported Standing balance-Leahy Scale: Good Standing balance comment: ambulates slower, but no device; stood to complete oral care                            ADL Overall ADL's : At baseline                                        General ADL Comments: Per clinincal judgement and family report pt is at baseline with ADLs.Did need light min A from regular height toilet. Discussed 3n1 over toilet at home to boost height.      Vision     Perception     Praxis      Pertinent Vitals/Pain Pain Assessment: No/denies pain Faces Pain Scale: Hurts little more Pain Location: soreness in thighs esp with stairs Pain Descriptors / Indicators: Aching Pain Intervention(s): Monitored during session     Hand Dominance Right   Extremity/Trunk Assessment Upper Extremity Assessment Upper Extremity Assessment: Overall WFL for tasks assessed   Lower Extremity Assessment Lower Extremity Assessment: Defer to PT evaluation       Communication Communication Communication: No difficulties   Cognition Arousal/Alertness: Awake/alert Behavior During Therapy: WFL for tasks assessed/performed Overall Cognitive Status: History of cognitive impairments - at baseline       Memory: Decreased recall of precautions;Decreased short-term memory             General Comments       Exercises       Shoulder Instructions      Home Living Family/patient expects to be discharged to:: Private residence  Living Arrangements: Spouse/significant other;Children Available Help at Discharge: Family;Available 24 hours/day Type of Home: House Home Access: Stairs to enter [employer] of Steps: 3 Entrance Stairs-Rails: Right Home Layout: One level     Bathroom Shower/Tub: Runner, broadcasting/film/video: Information systems manager      Lives With: Spouse    Prior Functioning/Environment Level of Independence: Needs assistance  Gait / Transfers Assistance Needed: Pt and family report no AD for ambulation ADL's / Homemaking Assistance Needed: min A for bathing, shower transfers and dressing        OT Diagnosis: Other (comment) (balance)   OT Problem List:     OT  Treatment/Interventions:      OT Goals(Current goals can be found in the care plan section)    OT Frequency:     Barriers to D/C:            Co-evaluation              End of Session    Activity Tolerance: Patient tolerated treatment well Patient left: in chair;with call bell/phone within reach;with family/visitor present   Time: 1204-1220 OT Time Calculation (min): 16 min Charges:  OT General Charges $OT Visit: 1 Procedure OT Evaluation $Initial OT Evaluation Tier I: 1 Procedure G-Codes: OT G-codes **NOT FOR INPATIENT CLASS** Functional Assessment Tool Used: clinical judgement  Functional Limitation: Self care Self Care Current Status (Z6109): At least 1 percent but less than 20 percent impaired, limited or restricted Self Care Goal Status (U0454): At least 1 percent but less than 20 percent impaired, limited or restricted Self Care Discharge Status [phone]): At least 1 percent but less than 20 percent impaired, limited or restricted  Pilar Grammes 11/04/2014, 12:31 PM

## 2014-11-04 NOTE — Progress Notes (Signed)
CK increasing-Hold discharge-start IVF and repeat lytes/CK in am. Husband notified.

## 2014-11-04 NOTE — Progress Notes (Signed)
EEG Completed; Results Pending  

## 2014-11-05 ENCOUNTER — Observation Stay (HOSPITAL_BASED_OUTPATIENT_CLINIC_OR_DEPARTMENT_OTHER): Payer: Medicare Other

## 2014-11-05 DIAGNOSIS — I6789 Other cerebrovascular disease: Secondary | ICD-10-CM

## 2014-11-05 DIAGNOSIS — R748 Abnormal levels of other serum enzymes: Secondary | ICD-10-CM | POA: Diagnosis not present

## 2014-11-05 LAB — BASIC METABOLIC PANEL
Anion gap: 9 (ref 5–15)
BUN: <5 mg/dL — ABNORMAL LOW (ref 6–20)
CO2: 26 mmol/L (ref 22–32)
Calcium: 8.6 mg/dL — ABNORMAL LOW (ref 8.9–10.3)
Chloride: 101 mmol/L (ref 101–111)
Creatinine, Ser: 0.69 mg/dL (ref 0.44–1.00)
GFR calc Af Amer: >60 mL/min (ref >60)
GFR calc non Af Amer: >60 mL/min (ref >60)
Glucose, Bld: 166 mg/dL — ABNORMAL HIGH (ref 65–99)
Potassium: 3.9 mmol/L (ref 3.5–5.1)
Sodium: 136 mmol/L (ref 135–145)

## 2014-11-05 LAB — GLUCOSE, CAPILLARY
Glucose-Capillary: 171 mg/dL — ABNORMAL HIGH (ref 65–99)
Glucose-Capillary: 208 mg/dL — ABNORMAL HIGH (ref 65–99)

## 2014-11-05 LAB — HEMOGLOBIN A1C
Hgb A1c MFr Bld: 7.6 % — ABNORMAL HIGH (ref 4.8–5.6)
Mean Plasma Glucose: 171 mg/dL

## 2014-11-05 LAB — CK: Total CK: 2793 U/L — ABNORMAL HIGH (ref 38–234)

## 2014-11-05 MED ORDER — WHITE PETROLATUM GEL
Status: AC
  Filled 2014-11-05: qty 1

## 2014-11-05 NOTE — Progress Notes (Signed)
Discharge instruction reviewed with patient/family. All questions answered at this time. Transport per family.   Sim BoastHavy, RN

## 2014-11-05 NOTE — Progress Notes (Signed)
  Echocardiogram 2D Echocardiogram has been performed.  Leta JunglingCooper, Kali Ambler M 11/05/2014, 8:25 AM

## 2014-11-05 NOTE — Progress Notes (Signed)
Seen and examined. Pleasant confused-vital signs stable. Very anxious to go home. Large family at bedside. Clear urine Nonfocal exam Lungs clear No edema  CK down trended to 2793 Creatinine-stable  Impression Resolving rhabdomyolysis Dementia with delirium  Plan Discharge home-have asked patient's husband to ensure patient is well hydrated and to follow-up with PCP in the next few days. Please see discharge summary done yesterday for further details.

## 2015-02-03 ENCOUNTER — Telehealth: Payer: Self-pay | Admitting: Diagnostic Neuroimaging

## 2015-02-03 NOTE — Telephone Encounter (Signed)
Spoke with husband and informed him Dr Marjory Lies would like his wife to see one of our NP. 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 She talks to her parents." Husband would like to discuss possible new medications that may help. Scheduled her to be seen on 02/05/15; requested they arrive 15 min early for check in. He verbalized understanding, appreciation.

## 2015-02-03 NOTE — Telephone Encounter (Signed)
Patient's husband called in to make an appointment for his wife who has Dementia/Sundown Syndrome.  He stated that she has seen Dr. Marjory Lies but that his wife would prefer a woman doctor. Would it be OK to switch the patient to Dr. Trevor Mace care.  Thanks!

## 2015-02-03 NOTE — Telephone Encounter (Signed)
pls setup appt with Eber Jones or Aundra Millet. Thanks. -VRP

## 2015-02-05 ENCOUNTER — Encounter: Payer: Self-pay | Admitting: Nurse Practitioner

## 2015-02-05 ENCOUNTER — Ambulatory Visit (INDEPENDENT_AMBULATORY_CARE_PROVIDER_SITE_OTHER): Payer: Medicare Other | Admitting: Nurse Practitioner

## 2015-02-05 VITALS — BP 158/76 | HR 80 | Ht 59.0 in | Wt 124.2 lb

## 2015-02-05 DIAGNOSIS — G309 Alzheimer's disease, unspecified: Secondary | ICD-10-CM

## 2015-02-05 DIAGNOSIS — F0280 Dementia in other diseases classified elsewhere without behavioral disturbance: Secondary | ICD-10-CM | POA: Diagnosis not present

## 2015-02-05 NOTE — Patient Instructions (Signed)
Create a safe environment, remove locks on bathroom  Doors Keep medicines locked up Secure knives, matches guns and keep these items out of reach Remove objects that may break around the house, reproduce any clutter rugs that might contribute to a fall Reduced confusion, keep familiar objects and people around, stick to a routine Use effective communication such as simple words and short sentences Reduce nighttime restlessness, a consistent nighttime routine,  avoid napping during the day Encourage good nutrition and hydration Seek medical care for acute worsening confusion or fever, this usually indicates infection Continue Namenda 10 mg twice daily Follow-up yearly

## 2015-02-05 NOTE — Progress Notes (Signed)
[facility]  PATIENT: Autumn Schneider DOB: October 06, 1940   REASON FOR VISIT: Follow-up for dementia HISTORY FROM: Husband and sister    HISTORY OF PRESENT ILLNESS: Autumn Schneider, 75 year old female returns for follow-up. She has a history of progressive memory disorder. Her husband is her 24/ 7 caregiver. Sister helps with personal care as she forgets to take a bath. She eats small frequent meals. She naps frequently during the day. Unfortunately she is up at night. She returns for reevaluation. She is currently on Namenda twice daily. She failed Aricept in the past as it decreased her heart rate to 40.     02/10/14: Dr. Luray Desanctis last visit, dementia has progressed. Here with husband today. Patient sometimes forgets to take a bath and gets "ornery" about it. Sometimes forgets to take meds. Husband and son are herlping her with medications. She stays home mainly. No pain or complaints today. Eats fairly well. Tends to "nibble" on food. More confusion in early AM.   UPDATE 07/16/12: Patient is accompanied by her daughter today. Patient has experienced loss of her son that was living with her since last office visit. Son commit suicide at the end of last year over despair of divorce. Right after incident, patient became very depressed and lost 20-30 lbs. She started seeing a psychologist regularly and has been doing much better over the last 4 months. Memory loss is stable. She is still driving short distances and taking care of her finances. Her husband monitors her medications.   UPDATE 05/23/11: Still driving. Son helps with her meds. Watches a lot of TV. Not much exercise.  UPDATE 03/16/11: Here for evaluation of her memory after being taken off her Aricept during hospitalization. She had an episode of left-sided leg and arm numbness and tingling, slurred speech on 03/08/2011. She was admitted to [facility] for 4 days for a TIA. During her hospitalization her heart rate dropped  into the 40s and her Aricept and diastolic were discontinued. Hospitalization was complicated by dehydration and hyponatremia. At this time she does not want to be restarted on her Aricept. Fall risk assessment 7. She denies any further episodes of numbness, tingling, weakness, slurred speech, swallowing difficulty. Hemoglobin A1c was 6.3. Carotid Dopplers indicated patient may have had some aortic surgery, patient denies. MRI and MRA of the brain were negative.  PRIOR HPI: Autumn Schneider is a 75 year-old right-handed Caucasian female who comes in for memory loss. Patient denies any significant memory loss. She thinks she is doing well. She thinks that she has had some depression and decreased mood related to retirementand her sons misfortune of his house burning down. There is an additional stress since her son has had to move in with her. According to the patient's sister who is representing her family's observation, they have noticed progressive short-term memory loss since the patient return in 2010. She's had more significant memory decline in the past 5 months particularly in the last 3-4 weeks. For example patient forgets short-term conversations, where she placed objects, and recently forgot where she parked her car at the mall, even though she parked in the same place she is thin parking for many years. The patient's family is concerned about her ability to drive safely. She lives with her husband and her son. She is able to maintain her personal activities of daily living (6), but is no longer able to cook independently while maintaining her finances Autumn Schneider 4). Of note, she has an identical twin sister who  does not have any memory problems.   REVIEW OF SYSTEMS: Full 14 system review of systems performed and notable only for those listed, all others are neg:  Constitutional: neg  Cardiovascular: neg Ear/Nose/Throat: Hearing loss  Skin: neg Eyes: neg Respiratory: neg Gastroitestinal: neg    Hematology/Lymphatic: neg  Endocrine: neg Musculoskeletal:neg Allergy/Immunology: neg Neurological: Memory loss Psychiatric: Agitation and confusion depression Sleep : neg   ALLERGIES: Allergies  Allergen Reactions  . Aricept [Donepezil Hcl]     Doesn't remember  . Erythromycin Hives    HOME MEDICATIONS: Outpatient Prescriptions Prior to Visit  Medication Sig Dispense Refill  . ACCU-CHEK SMARTVIEW test strip     . alendronate (FOSAMAX) 70 MG tablet Take 70 mg by mouth once a week. Take with a full glass of water on an empty stomach.    Marland Kitchen amLODipine (NORVASC) 10 MG tablet Take 10 mg by mouth daily.    Marland Kitchen aspirin EC 81 MG tablet Take 81 mg by mouth daily.    . Calcium Carbonate-Vitamin D (CALTRATE 600+D) 600-400 MG-UNIT per tablet Take 1 tablet by mouth daily.    . cholecalciferol (VITAMIN D) 1000 UNITS tablet Take 1,000 Units by mouth daily.    . memantine (NAMENDA) 10 MG tablet Take 10 mg by mouth 2 (two) times daily.    . metFORMIN (GLUCOPHAGE) 500 MG tablet Take 1,000 mg by mouth 2 (two) times daily.     . mirtazapine (REMERON) 15 MG tablet Take 15 mg by mouth at bedtime.     . pantoprazole (PROTONIX) 40 MG tablet Take 40 mg by mouth daily.    . sodium chloride 1 G tablet Take 1 g by mouth 2 (two) times daily.    Marland Kitchen amLODipine (NORVASC) 10 MG tablet Take 1 tablet (10 mg total) by mouth daily. 30 tablet 0  . clorazepate (TRANXENE) 7.5 MG tablet Take 7.5 mg by mouth 2 (two) times daily. For anxiety,    . loratadine (CLARITIN) 10 MG tablet Take 10 mg by mouth daily. Reported on 02/05/2015     No facility-administered medications prior to visit.    PAST MEDICAL HISTORY: Past Medical History  Diagnosis Date  . Diabetes mellitus   . Alzheimer disease     diagnosed 12/12  . Hypertension   . High cholesterol   . Migraine   . Anxiety and depression   . TIA (transient ischemic attack)   . Hyponatremia   . Vitamin D deficiency   . Esophageal reflux   . Osteoporosis   .  SIADH (syndrome of inappropriate ADH production) (HCC)   . Dementia     PAST SURGICAL HISTORY: Past Surgical History  Procedure Laterality Date  . Cesarean section    . Shoulder surgery  2004    FAMILY HISTORY: Family History  Problem Relation Age of Onset  . Stroke Mother   . COPD Mother   . Diabetes Sister   . Breast cancer Sister   . Fibromyalgia Sister   . Stroke Maternal Grandmother   . CVA Maternal Grandmother   . Heart attack Maternal Grandfather     SOCIAL HISTORY: Social History   Social History  . Marital Status: Married    Spouse Name: N/A  . Number of Children: 2  . Years of Education: 12th   Occupational History  . Retired    Social History Main Topics  . Smoking status: Never Smoker   . Smokeless tobacco: Not on file  . Alcohol Use: No  . Drug Use:  No  . Sexual Activity: Not on file   Other Topics Concern  . Not on file   Social History Narrative   Pt lives at home with her spouse and son.   Caffeine Use: 2-3 cups of coffee daily.     PHYSICAL EXAM  Filed Vitals:   02/05/15 1454  BP: 158/76  Pulse: 80  Height:  (1.499 m)  Weight: 124 lb 3.2 oz (56.337 kg)   Body mass index is 25.07 kg/(m^2).  Generalized: Well developed, in no acute distress  Head: normocephalic and atraumatic,. Oropharynx benign  Neck: Supple, no carotid bruits  Cardiac: Regular rate rhythm, no murmur  Musculoskeletal: No deformity   Neurological examination   Mentation: Alert , not oriented 3 MMSE 13/30. AFT 1. Unable to draw a clock.   Follows all commands , little speech  Cranial nerve II-XII: Pupils were equal round reactive to light extraocular movements were full, visual field were full on confrontational test. Facial sensation and strength were normal. hearing was intact to finger rubbing bilaterally. Uvula tongue midline. head turning and shoulder shrug were normal and symmetric.Tongue protrusion into cheek strength was normal. Motor: normal bulk  and tone, full strength in the BUE, BLE, fine finger movements normal, no pronator drift. No focal weakness Sensory: normal and symmetric to light touch,  Coordination: finger-nose-finger,  no dysmetria Reflexes: Diminished upper and lower, plantar responses were flexor bilaterally. Gait and Station: Rising up from seated position without assistance, wide based unsteady gait  Tandem not attempted   DIAGNOSTIC DATA (LABS, IMAGING, TESTING) - I reviewed patient records, labs, notes, testing and imaging myself where available.  Lab Results  Component Value Date   WBC 13.9* 11/03/2014   HGB 15.6* 11/03/2014   HCT 46.0 11/03/2014   MCV 87.2 11/03/2014   PLT 235 11/03/2014      Component Value Date/Time   NA 136 11/05/2014 0522   K 3.9 11/05/2014 0522   CL 101 11/05/2014 0522   CO2 26 11/05/2014 0522   GLUCOSE 166* 11/05/2014 0522   BUN <5* 11/05/2014 0522   CREATININE 0.69 11/05/2014 0522   CALCIUM 8.6* 11/05/2014 0522   PROT 7.7 11/03/2014 1100   ALBUMIN 4.3 11/03/2014 1100   AST 59* 11/03/2014 1100   ALT 25 11/03/2014 1100   ALKPHOS 39 11/03/2014 1100   BILITOT 1.0 11/03/2014 1100   GFRNONAA >60 11/05/2014 0522   GFRAA >60 11/05/2014 0522   Lab Results  Component Value Date   CHOL 80 11/04/2014   HDL 37* 11/04/2014   LDLCALC 31 11/04/2014   TRIG 60 11/04/2014   CHOLHDL 2.2 11/04/2014   Lab Results  Component Value Date   HGBA1C 7.6* 11/04/2014    ASSESSMENT AND PLAN 76 y.o. right-handed female with hypertension, diabetes, hypercholesterolemia, depression, and TIA here with moderate dementia without behavioral disturbance on memantine. Previously tried aricept but stopped due to bradycardia noted during hospital stay. Memory loss is progressing.   Create a safe environment, remove locks on bathroom  Doors Keep medicines locked up Secure knives, matches guns and keep these items out of reach Remove objects that may break around the house, reproduce any clutter rugs  that might contribute to a fall Reduced confusion, keep familiar objects and people around, stick to a routine Use effective communication such as simple words and short sentences Reduce nighttime restlessness, a consistent nighttime routine,  avoid napping during the day Encourage good nutrition and hydration Seek medical care for acute worsening confusion or fever,  this usually indicates infection Continue Namenda 10 mg twice daily Given a list of agencies that may provide assistance. Follow-up prn Additional 15 minutes answering multiple questions from sister and husband Vst time 30 min Nilda Riggs, [facility], [facility], APRN  [facility] [address] [place], [place] [address] [phone]

## 2015-02-10 NOTE — Progress Notes (Signed)
I reviewed note and agree with plan.   Rikia Sukhu R. Milayah Krell, MD  Certified in Neurology, Neurophysiology and Neuroimaging  [facility] [address], [address] [place], [place] [address] [phone]   

## 2017-05-18 ENCOUNTER — Encounter (HOSPITAL_COMMUNITY): Payer: Self-pay

## 2017-05-18 ENCOUNTER — Other Ambulatory Visit: Payer: Self-pay

## 2017-05-18 ENCOUNTER — Observation Stay (HOSPITAL_COMMUNITY)
Admission: EM | Admit: 2017-05-18 | Discharge: 2017-05-19 | Disposition: A | Discharge status: Home / Self Care | Payer: Medicare Other | Attending: Internal Medicine | Admitting: Internal Medicine

## 2017-05-18 ENCOUNTER — Emergency Department (HOSPITAL_COMMUNITY): Payer: Medicare Other

## 2017-05-18 DIAGNOSIS — Z7984 Long term (current) use of oral hypoglycemic drugs: Secondary | ICD-10-CM | POA: Insufficient documentation

## 2017-05-18 DIAGNOSIS — Z8673 Personal history of transient ischemic attack (TIA), and cerebral infarction without residual deficits: Secondary | ICD-10-CM | POA: Diagnosis not present

## 2017-05-18 DIAGNOSIS — E119 Type 2 diabetes mellitus without complications: Secondary | ICD-10-CM

## 2017-05-18 DIAGNOSIS — E7800 Pure hypercholesterolemia, unspecified: Secondary | ICD-10-CM | POA: Diagnosis present

## 2017-05-18 DIAGNOSIS — E1165 Type 2 diabetes mellitus with hyperglycemia: Secondary | ICD-10-CM | POA: Diagnosis present

## 2017-05-18 DIAGNOSIS — G309 Alzheimer's disease, unspecified: Secondary | ICD-10-CM | POA: Diagnosis not present

## 2017-05-18 DIAGNOSIS — Z7982 Long term (current) use of aspirin: Secondary | ICD-10-CM | POA: Diagnosis not present

## 2017-05-18 DIAGNOSIS — G459 Transient cerebral ischemic attack, unspecified: Principal | ICD-10-CM | POA: Insufficient documentation

## 2017-05-18 DIAGNOSIS — IMO0002 Reserved for concepts with insufficient information to code with codable children: Secondary | ICD-10-CM | POA: Diagnosis present

## 2017-05-18 DIAGNOSIS — F0280 Dementia in other diseases classified elsewhere without behavioral disturbance: Secondary | ICD-10-CM | POA: Insufficient documentation

## 2017-05-18 DIAGNOSIS — Z79899 Other long term (current) drug therapy: Secondary | ICD-10-CM | POA: Insufficient documentation

## 2017-05-18 DIAGNOSIS — I10 Essential (primary) hypertension: Secondary | ICD-10-CM | POA: Insufficient documentation

## 2017-05-18 DIAGNOSIS — R531 Weakness: Secondary | ICD-10-CM | POA: Diagnosis present

## 2017-05-18 LAB — COMPREHENSIVE METABOLIC PANEL
ALT: 11 U/L — ABNORMAL LOW (ref 14–54)
AST: 17 U/L (ref 15–41)
Albumin: 4 g/dL (ref 3.5–5.0)
Alkaline Phosphatase: 40 U/L (ref 38–126)
Anion gap: 11 (ref 5–15)
BUN: 22 mg/dL — ABNORMAL HIGH (ref 6–20)
CO2: 24 mmol/L (ref 22–32)
Calcium: 9.3 mg/dL (ref 8.9–10.3)
Chloride: 103 mmol/L (ref 101–111)
Creatinine, Ser: 1.08 mg/dL — ABNORMAL HIGH (ref 0.44–1.00)
GFR calc Af Amer: 56 mL/min — ABNORMAL LOW (ref >60)
GFR calc non Af Amer: 49 mL/min — ABNORMAL LOW (ref >60)
Glucose, Bld: 170 mg/dL — ABNORMAL HIGH (ref 65–99)
Potassium: 4.3 mmol/L (ref 3.5–5.1)
Sodium: 138 mmol/L (ref 135–145)
Total Bilirubin: 0.6 mg/dL (ref 0.3–1.2)
Total Protein: 7.4 g/dL (ref 6.5–8.1)

## 2017-05-18 LAB — DIFFERENTIAL
Basophils Absolute: 0 10*3/uL (ref 0.0–0.1)
Basophils Relative: 0 %
Eosinophils Absolute: 0.4 10*3/uL (ref 0.0–0.7)
Eosinophils Relative: 6 %
Lymphocytes Relative: 30 %
Lymphs Abs: 1.9 10*3/uL (ref 0.7–4.0)
Monocytes Absolute: 0.5 10*3/uL (ref 0.1–1.0)
Monocytes Relative: 7 %
Neutro Abs: 3.7 10*3/uL (ref 1.7–7.7)
Neutrophils Relative %: 57 %

## 2017-05-18 LAB — I-STAT CHEM 8, ED
BUN: 22 mg/dL — ABNORMAL HIGH (ref 6–20)
Calcium, Ion: 1.18 mmol/L (ref 1.15–1.40)
Chloride: 100 mmol/L — ABNORMAL LOW (ref 101–111)
Creatinine, Ser: 1 mg/dL (ref 0.44–1.00)
Glucose, Bld: 164 mg/dL — ABNORMAL HIGH (ref 65–99)
HCT: 35 % — ABNORMAL LOW (ref 36.0–46.0)
Hemoglobin: 11.9 g/dL — ABNORMAL LOW (ref 12.0–15.0)
Potassium: 4.2 mmol/L (ref 3.5–5.1)
Sodium: 138 mmol/L (ref 135–145)
TCO2: 26 mmol/L (ref 22–32)

## 2017-05-18 LAB — CBC
HCT: 34.8 % — ABNORMAL LOW (ref 36.0–46.0)
Hemoglobin: 11.4 g/dL — ABNORMAL LOW (ref 12.0–15.0)
MCH: 31.9 pg (ref 26.0–34.0)
MCHC: 32.8 g/dL (ref 30.0–36.0)
MCV: 97.5 fL (ref 78.0–100.0)
Platelets: 285 10*3/uL (ref 150–400)
RBC: 3.57 MIL/uL — ABNORMAL LOW (ref 3.87–5.11)
RDW: 12.7 % (ref 11.5–15.5)
WBC: 6.5 10*3/uL (ref 4.0–10.5)

## 2017-05-18 LAB — ETHANOL: Alcohol, Ethyl (B): <10 mg/dL (ref <10)

## 2017-05-18 LAB — PROTIME-INR
INR: 0.93
Prothrombin Time: 12.4 seconds (ref 11.4–15.2)

## 2017-05-18 LAB — I-STAT TROPONIN, ED: Troponin i, poc: 0 ng/mL (ref 0.00–0.08)

## 2017-05-18 LAB — APTT: aPTT: 31 seconds (ref 24–36)

## 2017-05-18 MED ORDER — ACETAMINOPHEN 650 MG RE SUPP: 650.0000 mg | RECTAL | Status: DC | PRN

## 2017-05-18 MED ORDER — PANTOPRAZOLE SODIUM 40 MG PO TBEC
40.0000 mg | DELAYED_RELEASE_TABLET | Freq: Every day | ORAL | Status: DC
  Administered 2017-05-19: 40 mg via ORAL
  Filled 2017-05-18: qty 1

## 2017-05-18 MED ORDER — PRAVASTATIN SODIUM 20 MG PO TABS: 20.0000 mg | ORAL_TABLET | Freq: Every day | ORAL | Status: DC

## 2017-05-18 MED ORDER — ENOXAPARIN SODIUM 40 MG/0.4ML ~~LOC~~ SOLN
40.0000 mg | SUBCUTANEOUS | Status: DC
  Administered 2017-05-19: 40 mg via SUBCUTANEOUS
  Filled 2017-05-18: qty 0.4

## 2017-05-18 MED ORDER — MIRTAZAPINE 30 MG PO TABS
30.0000 mg | ORAL_TABLET | Freq: Every day | ORAL | Status: DC
  Filled 2017-05-18: qty 1

## 2017-05-18 MED ORDER — IOPAMIDOL (ISOVUE-370) INJECTION 76%
100.0000 mL | Freq: Once | INTRAVENOUS | Status: AC | PRN
  Administered 2017-05-18: 80 mL via INTRAVENOUS

## 2017-05-18 MED ORDER — ASPIRIN 325 MG PO TABS: 325.0000 mg | ORAL_TABLET | Freq: Every day | ORAL | Status: DC

## 2017-05-18 MED ORDER — CLORAZEPATE DIPOTASSIUM 3.75 MG PO TABS
3.7500 mg | ORAL_TABLET | Freq: Two times a day (BID) | ORAL | Status: DC
  Administered 2017-05-19 (×2): 3.75 mg via ORAL
  Filled 2017-05-18 (×2): qty 1

## 2017-05-18 MED ORDER — CALCIUM CARBONATE-VITAMIN D 500-200 MG-UNIT PO TABS
1.0000 | ORAL_TABLET | Freq: Every day | ORAL | Status: DC
  Administered 2017-05-19: 1 via ORAL
  Filled 2017-05-18: qty 1

## 2017-05-18 MED ORDER — STROKE: EARLY STAGES OF RECOVERY BOOK
Freq: Once | Status: AC
  Administered 2017-05-19: 03:00:00
  Filled 2017-05-18 (×2): qty 1

## 2017-05-18 MED ORDER — ACETAMINOPHEN 325 MG PO TABS: 650.0000 mg | ORAL_TABLET | ORAL | Status: DC | PRN

## 2017-05-18 MED ORDER — AMLODIPINE BESYLATE 5 MG PO TABS
10.0000 mg | ORAL_TABLET | Freq: Every day | ORAL | Status: DC
  Administered 2017-05-19: 10 mg via ORAL
  Filled 2017-05-18: qty 2

## 2017-05-18 MED ORDER — ASPIRIN 300 MG RE SUPP: 300.0000 mg | Freq: Every day | RECTAL | Status: DC

## 2017-05-18 MED ORDER — SODIUM CHLORIDE 1 G PO TABS
1.0000 g | ORAL_TABLET | Freq: Two times a day (BID) | ORAL | Status: DC
  Filled 2017-05-18 (×2): qty 1

## 2017-05-18 MED ORDER — ACETAMINOPHEN 160 MG/5ML PO SOLN: 650.0000 mg | ORAL | Status: DC | PRN

## 2017-05-18 MED ORDER — INSULIN ASPART 100 UNIT/ML ~~LOC~~ SOLN
0.0000 [IU] | Freq: Three times a day (TID) | SUBCUTANEOUS | Status: DC
  Administered 2017-05-19 (×2): 1 [IU] via SUBCUTANEOUS

## 2017-05-18 MED ORDER — IOPAMIDOL (ISOVUE-370) INJECTION 76%
INTRAVENOUS | Status: AC
  Filled 2017-05-18: qty 100

## 2017-05-18 MED ORDER — SODIUM CHLORIDE 0.9 % IJ SOLN
INTRAMUSCULAR | Status: AC
Start: 2017-05-18 — End: 2017-05-19
  Filled 2017-05-18: qty 50

## 2017-05-18 MED ORDER — MEMANTINE HCL 10 MG PO TABS
10.0000 mg | ORAL_TABLET | Freq: Two times a day (BID) | ORAL | Status: DC
  Administered 2017-05-19 (×2): 10 mg via ORAL
  Filled 2017-05-18: qty 1
  Filled 2017-05-18: qty 2
  Filled 2017-05-18: qty 1

## 2017-05-18 NOTE — H&P (Signed)
History and Physical    Autumn Schneider ZOX:[id number] DOB: 09-08-1940 DOA: 05/18/2017  PCP: Merri Brunette, MD  Patient coming from: Home  I have personally briefly reviewed patient's old medical records in [facility]  Chief Complaint: TIA  HPI: Autumn Schneider is a 77 y.o. female with medical history significant of Alzheimier's disease, SIADH, DM2, HTN.  Pt husband reports that they were in [employer] today around noon and she suddenly started experiencing R side weakness and slurred speech.  Unable to walk normally at that time.  Lasted 10 min or less and resolved. Around 430, she started experiencing the symptoms again.  Lasted 20-30 mins this time.  They presented to ED.  He states that her symptoms have resolved now and she is at baseline.   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 Otherwise no large vessel occlusion, aneurysm, or significant stenosis.   Review of Systems: As per HPI otherwise 10 point review of systems negative.   Past Medical History:  Diagnosis Date  . Alzheimer disease    diagnosed 12/12  . Anxiety and depression   . Dementia   . Diabetes mellitus   . Esophageal reflux   . High cholesterol   . Hypertension   . Hyponatremia   . Migraine   . Osteoporosis   . SIADH (syndrome of inappropriate ADH production) (HCC)   . TIA (transient ischemic attack)   . Vitamin D deficiency     Past Surgical History:  Procedure Laterality Date  . CESAREAN SECTION    . SHOULDER SURGERY  2004     reports that she has never smoked. She does not have any smokeless tobacco history on file. She reports that she does not drink alcohol or use drugs.  Allergies  Allergen Reactions  . Aricept [Donepezil Hcl]     Doesn't remember  . Erythromycin Hives    Family History  Problem Relation Age of Onset  . Stroke Mother   . COPD Mother   . Diabetes Sister   . Breast cancer Sister   . Fibromyalgia Sister   . Stroke  Maternal Grandmother   . CVA Maternal Grandmother   . Heart attack Maternal Grandfather      Prior to Admission medications   Medication Sig Start Date End Date Taking? Authorizing Provider  amLODipine (NORVASC) 10 MG tablet Take 10 mg by mouth daily.   Yes [provider]  aspirin EC 81 MG tablet Take 81 mg by mouth daily.   Yes [provider]  Calcium Carbonate-Vitamin D (CALTRATE 600+D) 600-400 MG-UNIT per tablet Take 1 tablet by mouth daily.   Yes [provider]  clorazepate (TRANXENE) 3.75 MG tablet Take 3.75 mg by mouth 2 (two) times daily.  01/27/15  Yes [provider]  denosumab (PROLIA) 60 MG/ML SOSY injection Inject 60 mg into the skin every 6 (six) months.   Yes [provider]  Melatonin 10 MG TABS Take 1 tablet by mouth at bedtime.   Yes [provider]  memantine (NAMENDA) 10 MG tablet Take 10 mg by mouth 2 (two) times daily.   Yes [provider]  metFORMIN (GLUCOPHAGE) 500 MG tablet Take 1,000 mg by mouth 2 (two) times daily.  07/02/12  Yes [provider]  mirtazapine (REMERON) 30 MG tablet Take 30 mg by mouth at bedtime. 04/18/17  Yes [provider]  pantoprazole (PROTONIX) 40 MG tablet Take 40 mg by mouth daily.  Yes [provider]  pravastatin (PRAVACHOL) 20 MG tablet TK 1 T PO QD 04/18/17  Yes [provider]  sodium chloride 1 G tablet Take 1 g by mouth 2 (two) times daily with a meal.    Yes [provider]  ACCU-CHEK SMARTVIEW test strip  05/08/12   [provider]  amLODipine (NORVASC) 10 MG tablet Take 1 tablet (10 mg total) by mouth daily. 03/10/11 03/09/12  Joseph Art, DO    Physical Exam: Vitals:   05/18/17 1903 05/18/17 2150  BP: (!) 166/72   Pulse: 70 63  Resp: 19 (!) 21  Temp: 98 F (36.7 C)   TempSrc: Oral   SpO2: 99% 97%  Weight: 52.6 kg (116 lb)   Height:  (1.499 m)     Constitutional: NAD, calm, comfortable Eyes: PERRL,  lids and conjunctivae normal ENMT: Mucous membranes are moist. Posterior pharynx clear of any exudate or lesions.Normal dentition.  Neck: normal, supple, no masses, no thyromegaly Respiratory: clear to auscultation bilaterally, no wheezing, no crackles. Normal respiratory effort. No accessory muscle use.  Cardiovascular: Regular rate and rhythm, no murmurs / rubs / gallops. No extremity edema. 2+ pedal pulses. No carotid bruits.  Abdomen: no tenderness, no masses palpated. No hepatosplenomegaly. Bowel sounds positive.  Musculoskeletal: no clubbing / cyanosis. No joint deformity upper and lower extremities. Good ROM, no contractures. Normal muscle tone.  Skin: no rashes, lesions, ulcers. No induration Neurologic: CN 2-12 grossly intact. Sensation intact, DTR normal. Strength 5/5 in all 4.  Psychiatric: oriented to self.   Labs on Admission: I have personally reviewed following labs and imaging studies  CBC: Recent Labs  Lab 05/18/17 2002 05/18/17 2003  WBC 6.5  --   NEUTROABS 3.7  --   HGB 11.4* 11.9*  HCT 34.8* 35.0*  MCV 97.5  --   PLT 285  --    Basic Metabolic Panel: Recent Labs  Lab 05/18/17 2002 05/18/17 2003  NA 138 138  K 4.3 4.2  CL 103 100*  CO2 24  --   GLUCOSE 170* 164*  BUN 22* 22*  CREATININE 1.08* 1.00  CALCIUM 9.3  --    GFR: Estimated Creatinine Clearance: 35.5 mL/min (by C-G formula based on SCr of 1 mg/dL). Liver Function Tests: Recent Labs  Lab 05/18/17 2002  AST 17  ALT 11*  ALKPHOS 40  BILITOT 0.6  PROT 7.4  ALBUMIN 4.0   No results for input(s): LIPASE, AMYLASE in the last 168 hours. No results for input(s): AMMONIA in the last 168 hours. Coagulation Profile: Recent Labs  Lab 05/18/17 2002  INR 0.93   Cardiac Enzymes: No results for input(s): CKTOTAL, CKMB, CKMBINDEX, TROPONINI in the last 168 hours. BNP (last 3 results) No results for input(s): PROBNP in the last 8760 hours. HbA1C: No results for input(s): HGBA1C in the last 72  hours. CBG: No results for input(s): GLUCAP in the last 168 hours. Lipid Profile: No results for input(s): CHOL, HDL, LDLCALC, TRIG, CHOLHDL, LDLDIRECT in the last 72 hours. Thyroid Function Tests: No results for input(s): TSH, T4TOTAL, FREET4, T3FREE, THYROIDAB in the last 72 hours. Anemia Panel: No results for input(s): VITAMINB12, FOLATE, FERRITIN, TIBC, IRON, RETICCTPCT in the last 72 hours. Urine analysis:    Component Value Date/Time   COLORURINE YELLOW 11/03/2014 1243   APPEARANCEUR CLEAR 11/03/2014 1243   LABSPEC 1.022 11/03/2014 1243   PHURINE 6.5 11/03/2014 1243   GLUCOSEU >1000 (A) 11/03/2014 1243   HGBUR MODERATE (A) 11/03/2014  1243   BILIRUBINUR NEGATIVE 11/03/2014 1243   KETONESUR 15 (A) 11/03/2014 1243   PROTEINUR NEGATIVE 11/03/2014 1243   UROBILINOGEN 0.2 11/03/2014 1243   NITRITE NEGATIVE 11/03/2014 1243   LEUKOCYTESUR NEGATIVE 11/03/2014 1243    Radiological Exams on Admission: Ct Angio Head W Or Wo Contrast  Result Date: 05/18/2017 CLINICAL DATA:  77 y/o  F; right-sided weakness and slurred speech. EXAM: CT ANGIOGRAPHY HEAD AND NECK TECHNIQUE: Multidetector CT imaging of the head and neck was performed using the standard protocol during bolus administration of intravenous contrast. Multiplanar CT image reconstructions and MIPs were obtained to evaluate the vascular anatomy. Carotid stenosis measurements (when applicable) are obtained utilizing NASCET criteria, using the distal internal carotid diameter as the denominator. CONTRAST:  80mL ISOVUE-370 IOPAMIDOL (ISOVUE-370) INJECTION 76% COMPARISON:  11/03/2014 MRI and MRA of the head. 11/03/2014 CT of the head. FINDINGS: CT HEAD FINDINGS Brain: No evidence of acute infarction, hemorrhage, hydrocephalus, extra-axial collection or mass lesion/mass effect. Stable advanced chronic microvascular ischemic changes and parenchymal volume loss of the brain Vascular: As below. Skull: Normal. Negative for fracture or focal lesion.  Sinuses: Partial right mastoid opacification.  Otherwise negative. Orbits: No acute finding. Review of the MIP images confirms the above findings CTA NECK FINDINGS Aortic arch: Left vertebral artery arises from the aorta. Imaged portion shows no evidence of aneurysm or dissection. No significant stenosis of the major arch vessel origins. Calcific atherosclerosis of the aortic arch. Right carotid system: No evidence of dissection, stenosis (50% or greater) or occlusion. Mild non stenotic calcific atherosclerosis of the carotid bifurcation. Left carotid system: No evidence of dissection, stenosis (50% or greater) or occlusion. Mild non stenotic calcific atherosclerosis of the carotid bifurcation. Vertebral arteries: Right dominant. No evidence of dissection, stenosis (50% or greater) or occlusion. Skeleton: Mild cervical spondylosis. No high-grade bony canal stenosis. Other neck: Negative. Upper chest: Negative. Review of the MIP images confirms the above findings CTA HEAD FINDINGS Anterior circulation: No significant stenosis, proximal occlusion, aneurysm, or vascular malformation. Calcific atherosclerosis of carotid siphons without stenosis. Posterior circulation: Short segment of severe stenosis/near occlusion of the left PICA (series 10, image 244). Stable left distal P1 segment of severe stenosis. Otherwise no significant stenosis, proximal occlusion, aneurysm, or vascular malformation. Venous sinuses: As permitted by contrast timing, patent. Anatomic variants: Small anterior and probable diminutive bilateral posterior communicating arteries. Delayed phase: No abnormal intracranial enhancement. Review of the MIP images confirms the above findings IMPRESSION: 1. Patent carotid and vertebral arteries. No dissection, aneurysm, or hemodynamically significant stenosis utilizing NASCET criteria. 2. Patent anterior and posterior intracranial circulation. 3. 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 Otherwise no large vessel occlusion, aneurysm, or significant stenosis. 4. No acute abnormality on the noncontrast CT of head identified. No abnormal enhancement of the brain. 5. Stable advanced chronic microvascular ischemic changes and parenchymal volume loss of the brain. Electronically Signed   By: Mitzi Hansen M.D.   On: 05/18/2017 21:10   Ct Angio Neck W And/or Wo Contrast  Result Date: 05/18/2017 CLINICAL DATA:  77 y/o  F; right-sided weakness and slurred speech. EXAM: CT ANGIOGRAPHY HEAD AND NECK TECHNIQUE: Multidetector CT imaging of the head and neck was performed using the standard protocol during bolus administration of intravenous contrast. Multiplanar CT image reconstructions and MIPs were obtained to evaluate the vascular anatomy. Carotid stenosis measurements (when applicable) are obtained utilizing NASCET criteria, using the distal internal carotid diameter as the denominator. CONTRAST:  80mL ISOVUE-370  IOPAMIDOL (ISOVUE-370) INJECTION 76% COMPARISON:  11/03/2014 MRI and MRA of the head. 11/03/2014 CT of the head. FINDINGS: CT HEAD FINDINGS Brain: No evidence of acute infarction, hemorrhage, hydrocephalus, extra-axial collection or mass lesion/mass effect. Stable advanced chronic microvascular ischemic changes and parenchymal volume loss of the brain Vascular: As below. Skull: Normal. Negative for fracture or focal lesion. Sinuses: Partial right mastoid opacification.  Otherwise negative. Orbits: No acute finding. Review of the MIP images confirms the above findings CTA NECK FINDINGS Aortic arch: Left vertebral artery arises from the aorta. Imaged portion shows no evidence of aneurysm or dissection. No significant stenosis of the major arch vessel origins. Calcific atherosclerosis of the aortic arch. Right carotid system: No evidence of dissection, stenosis (50% or greater) or occlusion. Mild non stenotic calcific atherosclerosis of the carotid bifurcation. Left carotid  system: No evidence of dissection, stenosis (50% or greater) or occlusion. Mild non stenotic calcific atherosclerosis of the carotid bifurcation. Vertebral arteries: Right dominant. No evidence of dissection, stenosis (50% or greater) or occlusion. Skeleton: Mild cervical spondylosis. No high-grade bony canal stenosis. Other neck: Negative. Upper chest: Negative. Review of the MIP images confirms the above findings CTA HEAD FINDINGS Anterior circulation: No significant stenosis, proximal occlusion, aneurysm, or vascular malformation. Calcific atherosclerosis of carotid siphons without stenosis. Posterior circulation: Short segment of severe stenosis/near occlusion of the left PICA (series 10, image 244). Stable left distal P1 segment of severe stenosis. Otherwise no significant stenosis, proximal occlusion, aneurysm, or vascular malformation. Venous sinuses: As permitted by contrast timing, patent. Anatomic variants: Small anterior and probable diminutive bilateral posterior communicating arteries. Delayed phase: No abnormal intracranial enhancement. Review of the MIP images confirms the above findings IMPRESSION: 1. Patent carotid and vertebral arteries. No dissection, aneurysm, or hemodynamically significant stenosis utilizing NASCET criteria. 2. Patent anterior and posterior intracranial circulation. 3. 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 Otherwise no large vessel occlusion, aneurysm, or significant stenosis. 4. No acute abnormality on the noncontrast CT of head identified. No abnormal enhancement of the brain. 5. Stable advanced chronic microvascular ischemic changes and parenchymal volume loss of the brain. Electronically Signed   By: Mitzi Hansen M.D.   On: 05/18/2017 21:10    EKG: Independently reviewed.  Assessment/Plan Principal Problem:   TIA (transient ischemic attack) Active Problems:   Hypertension   Diabetes mellitus type 2, uncontrolled  (HCC)   Alzheimer's dementia   Hypercholesteremia    1. TIA - 1. TIA pathway 2. Tele monitor 3. Neuro checks 4. MRI brain 5. 2d echo 6. [facility] to [facility] 7. Will put on ASA 325 for now till neuro says otherwise 2. HLD - FLP pending, on pravastatin 3. DM2 - 1. Hold metformin 2. Sensitive SSI AC 4. HTN - continue home BP meds 5. Alzheimer's - continue home meds  DVT prophylaxis: Lovenox Code Status: DNR - confirmed with family Family Communication: Family at bedside Disposition Plan: Home after admit Consults called: Neuro Admission status: Place in obs   Hillary Bow. DO [facility] Pager [phone]  If 7AM-7PM, please contact day team taking care of patient [email or web address] Password [facility]  05/18/2017, 10:29 PM

## 2017-05-18 NOTE — ED Provider Notes (Signed)
[facility]-EMERGENCY DEPT Provider Note   CSN: [id number] Arrival date & time: 05/18/17  1854     History   Chief Complaint Chief Complaint  Patient presents with  . Weakness    R    HPI Autumn Schneider is a 77 y.o. female.  HPI   77 year old female with a history of dementia, diabetes, hypertension, hyperlipidemia, prior TIA, presents with concern for episodes of right-sided weakness with speech difficulty and visual changes.  Family reports that initially they are walking at the [facility] at approximately 12 PM, when she suddenly gripped the cart and had developed right-sided arm and leg weakness and was unable to walk normally.  Reports that she appeared to have speech difficulties during this episode, and had had visual changes.  They helped her get into the car, and her symptoms improved.  They report they lasted approximately 10 minutes or less and resolved.  They returned home, and then approximately at 4:30 PM she again developed right-sided weakness with speech difficulties.  They report that this episode lasted longer than the first, approximately 20 to 30 minutes.  She has again returned to baseline without neurologic deficit.  Denies headache, or other acute concern.  Family reports she has a history of dementia, but she normally has no problems walking, and are concerned regarding episodes of right-sided weakness.  Past Medical History:  Diagnosis Date  . Alzheimer disease    diagnosed 12/12  . Anxiety and depression   . Dementia   . Diabetes mellitus   . Esophageal reflux   . High cholesterol   . Hypertension   . Hyponatremia   . Migraine   . Osteoporosis   . SIADH (syndrome of inappropriate ADH production) (HCC)   . TIA (transient ischemic attack)   . Vitamin D deficiency     Patient Active Problem List   Diagnosis Date Noted  . Fall 11/03/2014  . Diabetes mellitus type 2, uncontrolled (HCC) 11/03/2014  . Alzheimer's dementia 11/03/2014  .  Depression 11/03/2014  . Hypercholesteremia 11/03/2014  . Elevated CK 11/03/2014  . TIA (transient ischemic attack) 03/07/2011  . Hyponatremia 03/07/2011  . EOSINOPHILIA 03/13/2008  . PRURITUS 02/21/2008  . DIABETES, TYPE 2 05/17/2007  . Hypertension 05/17/2007  . EUSTACHIAN TUBE DYSFUNCTION 05/01/2007  . LARYNGITIS, ACUTE 05/01/2007  . ALLERGIC RHINITIS 05/01/2007    Past Surgical History:  Procedure Laterality Date  . CESAREAN SECTION    . SHOULDER SURGERY  2004     OB History   None      Home Medications    Prior to Admission medications   Medication Sig Start Date End Date Taking? Authorizing Provider  amLODipine (NORVASC) 10 MG tablet Take 10 mg by mouth daily.   Yes [provider]  aspirin EC 81 MG tablet Take 81 mg by mouth daily.   Yes [provider]  Calcium Carbonate-Vitamin D (CALTRATE 600+D) 600-400 MG-UNIT per tablet Take 1 tablet by mouth daily.   Yes [provider]  clorazepate (TRANXENE) 3.75 MG tablet Take 3.75 mg by mouth 2 (two) times daily.  01/27/15  Yes [provider]  denosumab (PROLIA) 60 MG/ML SOSY injection Inject 60 mg into the skin every 6 (six) months.   Yes [provider]  Melatonin 10 MG TABS Take 1 tablet by mouth at bedtime.   Yes [provider]  memantine (NAMENDA) 10 MG tablet Take 10 mg by mouth 2 (two) times daily.   Yes [provider]  metFORMIN (GLUCOPHAGE) 500 MG tablet Take 1,000 mg by mouth 2 (two) times daily.  07/02/12  Yes [provider]  mirtazapine (REMERON) 30 MG tablet Take 30 mg by mouth at bedtime. 04/18/17  Yes [provider]  pantoprazole (PROTONIX) 40 MG tablet Take 40 mg by mouth daily.   Yes [provider]  pravastatin (PRAVACHOL) 20 MG tablet TK 1 T PO QD 04/18/17  Yes [provider]  sodium chloride 1 G tablet Take 1 g by mouth 2 (two) times daily with a meal.    Yes [provider]  ACCU-CHEK SMARTVIEW  test strip  05/08/12   [provider]  amLODipine (NORVASC) 10 MG tablet Take 1 tablet (10 mg total) by mouth daily. 03/10/11 03/09/12  Joseph Art, DO    Family History Family History  Problem Relation Age of Onset  . Stroke Mother   . COPD Mother   . Diabetes Sister   . Breast cancer Sister   . Fibromyalgia Sister   . Stroke Maternal Grandmother   . CVA Maternal Grandmother   . Heart attack Maternal Grandfather     Social History Social History   Tobacco Use  . Smoking status: Never Smoker  Substance Use Topics  . Alcohol use: No  . Drug use: No     Allergies   Aricept [donepezil hcl] and Erythromycin   Review of Systems Review of Systems  Unable to perform ROS: Dementia  Constitutional: Negative for fever.  Eyes: Positive for visual disturbance.  Respiratory: Negative for shortness of breath.   Cardiovascular: Negative for chest pain.  Gastrointestinal: Negative for nausea and vomiting.  Musculoskeletal: Positive for gait problem.  Neurological: Positive for speech difficulty and weakness. Negative for headaches.     Physical Exam Updated Vital Signs BP 136/71   Pulse (!) 58   Temp 98 F (36.7 C) (Oral)   Resp 15   Ht  (1.499 m)   Wt 52.6 kg (116 lb)   SpO2 98%   BMI 23.43 kg/m   Physical Exam  Constitutional: She appears well-developed and well-nourished. No distress.  HENT:  Head: Normocephalic and atraumatic.  Eyes: Conjunctivae and EOM are normal.  Neck: Normal range of motion.  Cardiovascular: Normal rate, regular rhythm, normal heart sounds and intact distal pulses. Exam reveals no gallop and no friction rub.  No murmur heard. Pulmonary/Chest: Effort normal and breath sounds normal. No respiratory distress. She has no wheezes. She has no rales.  Abdominal: Soft. She exhibits no distension. There is no tenderness. There is no guarding.  Musculoskeletal: She exhibits no edema or tenderness.  Neurological: She is alert. She  has normal strength. No cranial nerve deficit or sensory deficit. Coordination normal. GCS eye subscore is 4. GCS verbal subscore is 5. GCS motor subscore is 6.  Oriented to self   Skin: Skin is warm and dry. No rash noted. She is not diaphoretic. No erythema.  Nursing note and vitals reviewed.    ED Treatments / Results  Labs (all labs ordered are listed, but only abnormal results are displayed) Labs Reviewed  CBC - Abnormal; Notable for the following components:      Result Value   RBC 3.57 (*)    Hemoglobin 11.4 (*)    HCT 34.8 (*)    All other components within normal limits  COMPREHENSIVE METABOLIC PANEL - Abnormal; Notable for the following components:   Glucose, Bld 170 (*)    BUN 22 (*)  Creatinine, Ser 1.08 (*)    ALT 11 (*)    GFR calc non Af Amer 49 (*)    GFR calc Af Amer 56 (*)    All other components within normal limits  I-STAT CHEM 8, ED - Abnormal; Notable for the following components:   Chloride 100 (*)    BUN 22 (*)    Glucose, Bld 164 (*)    Hemoglobin 11.9 (*)    HCT 35.0 (*)    All other components within normal limits  ETHANOL  PROTIME-INR  APTT  DIFFERENTIAL  RAPID URINE DRUG SCREEN, HOSP PERFORMED  URINALYSIS, ROUTINE W REFLEX MICROSCOPIC  HEMOGLOBIN A1C  LIPID PANEL  I-STAT TROPONIN, ED    EKG None  Radiology Ct Angio Head W Or Wo Contrast  Result Date: 05/18/2017 CLINICAL DATA:  77 y/o  F; right-sided weakness and slurred speech. EXAM: CT ANGIOGRAPHY HEAD AND NECK TECHNIQUE: Multidetector CT imaging of the head and neck was performed using the standard protocol during bolus administration of intravenous contrast. Multiplanar CT image reconstructions and MIPs were obtained to evaluate the vascular anatomy. Carotid stenosis measurements (when applicable) are obtained utilizing NASCET criteria, using the distal internal carotid diameter as the denominator. CONTRAST:  80mL ISOVUE-370 IOPAMIDOL (ISOVUE-370) INJECTION 76% COMPARISON:  11/03/2014  MRI and MRA of the head. 11/03/2014 CT of the head. FINDINGS: CT HEAD FINDINGS Brain: No evidence of acute infarction, hemorrhage, hydrocephalus, extra-axial collection or mass lesion/mass effect. Stable advanced chronic microvascular ischemic changes and parenchymal volume loss of the brain Vascular: As below. Skull: Normal. Negative for fracture or focal lesion. Sinuses: Partial right mastoid opacification.  Otherwise negative. Orbits: No acute finding. Review of the MIP images confirms the above findings CTA NECK FINDINGS Aortic arch: Left vertebral artery arises from the aorta. Imaged portion shows no evidence of aneurysm or dissection. No significant stenosis of the major arch vessel origins. Calcific atherosclerosis of the aortic arch. Right carotid system: No evidence of dissection, stenosis (50% or greater) or occlusion. Mild non stenotic calcific atherosclerosis of the carotid bifurcation. Left carotid system: No evidence of dissection, stenosis (50% or greater) or occlusion. Mild non stenotic calcific atherosclerosis of the carotid bifurcation. Vertebral arteries: Right dominant. No evidence of dissection, stenosis (50% or greater) or occlusion. Skeleton: Mild cervical spondylosis. No high-grade bony canal stenosis. Other neck: Negative. Upper chest: Negative. Review of the MIP images confirms the above findings CTA HEAD FINDINGS Anterior circulation: No significant stenosis, proximal occlusion, aneurysm, or vascular malformation. Calcific atherosclerosis of carotid siphons without stenosis. Posterior circulation: Short segment of severe stenosis/near occlusion of the left PICA (series 10, image 244). Stable left distal P1 segment of severe stenosis. Otherwise no significant stenosis, proximal occlusion, aneurysm, or vascular malformation. Venous sinuses: As permitted by contrast timing, patent. Anatomic variants: Small anterior and probable diminutive bilateral posterior communicating arteries. Delayed  phase: No abnormal intracranial enhancement. Review of the MIP images confirms the above findings IMPRESSION: 1. Patent carotid and vertebral arteries. No dissection, aneurysm, or hemodynamically significant stenosis utilizing NASCET criteria. 2. Patent anterior and posterior intracranial circulation. 3. 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 Otherwise no large vessel occlusion, aneurysm, or significant stenosis. 4. No acute abnormality on the noncontrast CT of head identified. No abnormal enhancement of the brain. 5. Stable advanced chronic microvascular ischemic changes and parenchymal volume loss of the brain. Electronically Signed   By: Mitzi Hansen M.D.   On: 05/18/2017 21:10   Ct Angio Neck W  And/or Wo Contrast  Result Date: 05/18/2017 CLINICAL DATA:  77 y/o  F; right-sided weakness and slurred speech. EXAM: CT ANGIOGRAPHY HEAD AND NECK TECHNIQUE: Multidetector CT imaging of the head and neck was performed using the standard protocol during bolus administration of intravenous contrast. Multiplanar CT image reconstructions and MIPs were obtained to evaluate the vascular anatomy. Carotid stenosis measurements (when applicable) are obtained utilizing NASCET criteria, using the distal internal carotid diameter as the denominator. CONTRAST:  80mL ISOVUE-370 IOPAMIDOL (ISOVUE-370) INJECTION 76% COMPARISON:  11/03/2014 MRI and MRA of the head. 11/03/2014 CT of the head. FINDINGS: CT HEAD FINDINGS Brain: No evidence of acute infarction, hemorrhage, hydrocephalus, extra-axial collection or mass lesion/mass effect. Stable advanced chronic microvascular ischemic changes and parenchymal volume loss of the brain Vascular: As below. Skull: Normal. Negative for fracture or focal lesion. Sinuses: Partial right mastoid opacification.  Otherwise negative. Orbits: No acute finding. Review of the MIP images confirms the above findings CTA NECK FINDINGS Aortic arch: Left vertebral  artery arises from the aorta. Imaged portion shows no evidence of aneurysm or dissection. No significant stenosis of the major arch vessel origins. Calcific atherosclerosis of the aortic arch. Right carotid system: No evidence of dissection, stenosis (50% or greater) or occlusion. Mild non stenotic calcific atherosclerosis of the carotid bifurcation. Left carotid system: No evidence of dissection, stenosis (50% or greater) or occlusion. Mild non stenotic calcific atherosclerosis of the carotid bifurcation. Vertebral arteries: Right dominant. No evidence of dissection, stenosis (50% or greater) or occlusion. Skeleton: Mild cervical spondylosis. No high-grade bony canal stenosis. Other neck: Negative. Upper chest: Negative. Review of the MIP images confirms the above findings CTA HEAD FINDINGS Anterior circulation: No significant stenosis, proximal occlusion, aneurysm, or vascular malformation. Calcific atherosclerosis of carotid siphons without stenosis. Posterior circulation: Short segment of severe stenosis/near occlusion of the left PICA (series 10, image 244). Stable left distal P1 segment of severe stenosis. Otherwise no significant stenosis, proximal occlusion, aneurysm, or vascular malformation. Venous sinuses: As permitted by contrast timing, patent. Anatomic variants: Small anterior and probable diminutive bilateral posterior communicating arteries. Delayed phase: No abnormal intracranial enhancement. Review of the MIP images confirms the above findings IMPRESSION: 1. Patent carotid and vertebral arteries. No dissection, aneurysm, or hemodynamically significant stenosis utilizing NASCET criteria. 2. Patent anterior and posterior intracranial circulation. 3. 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 Otherwise no large vessel occlusion, aneurysm, or significant stenosis. 4. No acute abnormality on the noncontrast CT of head identified. No abnormal enhancement of the brain. 5.  Stable advanced chronic microvascular ischemic changes and parenchymal volume loss of the brain. Electronically Signed   By: Mitzi Hansen M.D.   On: 05/18/2017 21:10    Procedures Procedures (including critical care time)  Medications Ordered in ED Medications  sodium chloride 0.9 % injection (has no administration in time range)  iopamidol (ISOVUE-370) 76 % injection (has no administration in time range)  insulin aspart (novoLOG) injection 0-9 Units (has no administration in time range)  amLODipine (NORVASC) tablet 10 mg (has no administration in time range)  memantine (NAMENDA) tablet 10 mg (has no administration in time range)  clorazepate (TRANXENE) tablet 3.75 mg (has no administration in time range)  Calcium Carbonate-Vitamin D 600-400 MG-UNIT 1 tablet (has no administration in time range)  pantoprazole (PROTONIX) EC tablet 40 mg (has no administration in time range)  pravastatin (PRAVACHOL) tablet 20 mg (has no administration in time range)  sodium chloride tablet 1 g (has no administration in  time range)  mirtazapine (REMERON) tablet 30 mg (has no administration in time range)   stroke: mapping our early stages of recovery book (has no administration in time range)  acetaminophen (TYLENOL) tablet 650 mg (has no administration in time range)    Or  acetaminophen (TYLENOL) solution 650 mg (has no administration in time range)    Or  acetaminophen (TYLENOL) suppository 650 mg (has no administration in time range)  enoxaparin (LOVENOX) injection 40 mg (has no administration in time range)  aspirin suppository 300 mg (has no administration in time range)    Or  aspirin tablet 325 mg (has no administration in time range)  iopamidol (ISOVUE-370) 76 % injection 100 mL (80 mLs Intravenous Contrast Given 05/18/17 2028)     Initial Impression / Assessment and Plan / ED Course  I have reviewed the triage vital signs and the nursing notes.  Pertinent labs & imaging results  that were available during my care of the patient were reviewed by me and considered in my medical decision making (see chart for details).     77 year old female with a history of dementia, diabetes, hypertension, hyperlipidemia, prior TIA, presents with concern for episodes of right-sided weakness with speech difficulty and visual changes.  Patient with normal neurologic exam with exception of her dementia on arrival to the emergency department.  Feel that history is most consistent with 2 TIAs given acute onset of right-sided weakness with resolution.  Ordered emergent CT Angie of head to evaluate for large vessel occlusion given staccato symptoms, which showed no evidence of acute abnormalities or large vessel occlusion.    Discussed with Dr. Wilford Corner of neurology.  He came to bedside and evaluated the patient.  Will admit to [facility] for continued CVA/TIA work-up.  Final Clinical Impressions(s) / ED Diagnoses   Final diagnoses:  TIA (transient ischemic attack)    ED Discharge Orders    None       Alvira Monday, MD 05/19/17 0101

## 2017-05-18 NOTE — ED Notes (Signed)
ED TO INPATIENT HANDOFF REPORT  Name/Age/Gender Autumn Schneider 77 y.o. female  Code Status    Code Status Orders  (From admission, onward)        Start     Ordered   05/18/17 2213  Do not attempt resuscitation (DNR)  Continuous    Question Answer Comment  In the event of cardiac or respiratory ARREST Do not call a "code blue"   In the event of cardiac or respiratory ARREST Do not perform Intubation, CPR, defibrillation or ACLS   In the event of cardiac or respiratory ARREST Use medication by any route, position, wound care, and other measures to relive pain and suffering. May use oxygen, suction and manual treatment of airway obstruction as needed for comfort.      05/18/17 2213    Code Status History    Date Active Date Inactive Code Status Order ID Comments User Context   11/03/2014 1707 11/05/2014 1754 DNR [id number]  Willia Craze, NP Inpatient    Advance Directive Documentation     Most Recent Value  Type of Advance Directive  Healthcare Power of Attorney  Pre-existing out of facility DNR order (yellow form or pink MOST form)  -  "MOST" Form in Place?  -      Home/SNF/Other Home  Chief Complaint SOB; Emesis; Fever  Level of Care/Admitting Diagnosis ED Disposition    ED Disposition Condition [facility]: [place] [[id number]]  Level of Care: Telemetry [5]  Diagnosis: TIA (transient ischemic attack) [433295]  Admitting Physician: Doreatha Massed  Attending Physician: Etta Quill [phone]  Estimated length of stay: past midnight tomorrow  Certification:: I certify this patient will need inpatient services for at least 2 midnights  PT Class (Do Not Modify): Inpatient [101]  PT Acc Code (Do Not Modify): Private [1]       Medical History Past Medical History:  Diagnosis Date  . Alzheimer disease    diagnosed 12/12  . Anxiety and depression   . Dementia   . Diabetes mellitus   . Esophageal reflux   . High  cholesterol   . Hypertension   . Hyponatremia   . Migraine   . Osteoporosis   . SIADH (syndrome of inappropriate ADH production) ([place])   . TIA (transient ischemic attack)   . Vitamin D deficiency     Allergies Allergies  Allergen Reactions  . Aricept [Donepezil Hcl]     Doesn't remember  . Erythromycin Hives    IV Location/Drains/Wounds Patient Lines/Drains/Airways Status   Active Line/Drains/Airways    Name:   Placement date:   Placement time:   Site:   Days:   Peripheral IV 05/18/17 Left Antecubital   05/18/17    2003    Antecubital   less than 1          Labs/Imaging Results for orders placed or performed during the hospital encounter of 05/18/17 (from the past 48 hour(s))  Ethanol     Status: None   Collection Time: 05/18/17  8:02 PM  Result Value Ref Range   Alcohol, Ethyl (B) <10 <10 mg/dL    Comment:        LOWEST DETECTABLE LIMIT FOR SERUM ALCOHOL IS 10 mg/dL FOR MEDICAL PURPOSES ONLY Performed at [facility] [address], [place], [place] [address]   Protime-INR     Status: None   Collection Time: 05/18/17  8:02 PM  Result Value Ref Range  Prothrombin Time 12.4 11.4 - 15.2 seconds   INR 0.93     Comment: Performed at [facility], [place] [address], [place], [place] [address]  APTT     Status: None   Collection Time: 05/18/17  8:02 PM  Result Value Ref Range   aPTT 31 24 - 36 seconds    Comment: Performed at [facility], [place] [address], [place], [place] [address]  CBC     Status: Abnormal   Collection Time: 05/18/17  8:02 PM  Result Value Ref Range   WBC 6.5 4.0 - 10.5 K/uL   RBC 3.57 (L) 3.87 - 5.11 MIL/uL   Hemoglobin 11.4 (L) 12.0 - 15.0 g/dL   HCT 34.8 (L) 36.0 - 46.0 %   MCV 97.5 78.0 - 100.0 fL   MCH 31.9 26.0 - 34.0 pg   MCHC 32.8 30.0 - 36.0 g/dL   RDW 12.7 11.5 - 15.5 %   Platelets 285 150 - 400 K/uL    Comment: Performed at [facility], [place] [address], [place], [place] [address]  Differential     Status: None   Collection Time: 05/18/17  8:02 PM  Result Value Ref Range   Neutrophils Relative % 57 %   Neutro Abs 3.7 1.7 - 7.7 K/uL   Lymphocytes Relative 30 %   Lymphs Abs 1.9 0.7 - 4.0 K/uL   Monocytes Relative 7 %   Monocytes Absolute 0.5 0.1 - 1.0 K/uL   Eosinophils Relative 6 %   Eosinophils Absolute 0.4 0.0 - 0.7 K/uL   Basophils Relative 0 %   Basophils Absolute 0.0 0.0 - 0.1 K/uL    Comment: Performed at [facility], [place] [address], [place],  [address]  Comprehensive metabolic panel     Status: Abnormal   Collection Time: 05/18/17  8:02 PM  Result Value Ref Range   Sodium 138 135 - 145 mmol/L   Potassium 4.3 3.5 - 5.1 mmol/L   Chloride 103 101 - 111 mmol/L   CO2 24 22 - 32 mmol/L   Glucose, Bld 170 (H) 65 - 99 mg/dL   BUN 22 (H) 6 - 20 mg/dL   Creatinine, Ser 1.08 (H) 0.44 - 1.00 mg/dL   Calcium 9.3 8.9 - 10.3 mg/dL   Total Protein 7.4 6.5 - 8.1 g/dL   Albumin 4.0 3.5 - 5.0 g/dL   AST 17 15 - 41 U/L   ALT 11 (L) 14 - 54 U/L   Alkaline Phosphatase 40 38 - 126 U/L   Total Bilirubin 0.6 0.3 - 1.2 mg/dL   GFR calc non Af Amer 49 (L) >60 mL/min   GFR calc Af Amer 56 (L) >60 mL/min    Comment: (NOTE) The eGFR has been calculated using the CKD EPI equation. This calculation has not been validated in all clinical situations. eGFR's persistently <60 mL/min signify possible Chronic Kidney Disease.    Anion gap 11 5 - 15    Comment: Performed at [facility], [place] [address], [place],  [address]  I-stat troponin, ED     Status: None   Collection Time: 05/18/17  8:02 PM  Result Value Ref Range   Troponin i, poc 0.00 0.00 - 0.08 ng/mL   Comment 3            Comment: Due to the release kinetics of cTnI, a negative result within the first hours of the onset of symptoms does not rule out  myocardial infarction with certainty. If myocardial infarction is still  suspected, repeat the test at appropriate intervals.   I-Stat Chem 8, ED     Status: Abnormal   Collection Time: 05/18/17  8:03 PM  Result Value Ref Range   Sodium 138 135 - 145 mmol/L   Potassium 4.2 3.5 - 5.1 mmol/L   Chloride 100 (L) 101 - 111 mmol/L   BUN 22 (H) 6 - 20 mg/dL   Creatinine, Ser 1.00 0.44 - 1.00 mg/dL   Glucose, Bld 164 (H) 65 - 99 mg/dL   Calcium, Ion 1.18 1.15 - 1.40 mmol/L   TCO2 26 22 - 32 mmol/L   Hemoglobin 11.9 (L) 12.0 - 15.0 g/dL   HCT 35.0 (L) 36.0 - 46.0 %   Ct Angio Head W Or Wo Contrast  Result Date: 05/18/2017 CLINICAL DATA:  77 y/o  F; right-sided weakness and slurred speech. EXAM: CT ANGIOGRAPHY HEAD AND NECK TECHNIQUE: Multidetector CT imaging of the head and neck was performed using the standard protocol during bolus administration of intravenous contrast. Multiplanar CT image reconstructions and MIPs were obtained to evaluate the vascular anatomy. Carotid stenosis measurements (when applicable) are obtained utilizing NASCET criteria, using the distal internal carotid diameter as the denominator. CONTRAST:  74m ISOVUE-370 IOPAMIDOL (ISOVUE-370) INJECTION 76% COMPARISON:  11/03/2014 MRI and MRA of the head. 11/03/2014 CT of the head. FINDINGS: CT HEAD FINDINGS Brain: No evidence of acute infarction, hemorrhage, hydrocephalus, extra-axial collection or mass lesion/mass effect. Stable advanced chronic microvascular ischemic changes and parenchymal volume loss of the brain Vascular: As below. Skull: Normal. Negative for fracture or focal lesion. Sinuses: Partial right mastoid opacification.  Otherwise negative. Orbits: No acute finding. Review of the MIP images confirms the above findings CTA NECK FINDINGS Aortic arch: Left vertebral artery arises from the aorta. Imaged portion shows no evidence of aneurysm or dissection. No significant stenosis of the major arch vessel origins. Calcific atherosclerosis of the aortic arch. Right carotid system: No evidence of  dissection, stenosis (50% or greater) or occlusion. Mild non stenotic calcific atherosclerosis of the carotid bifurcation. Left carotid system: No evidence of dissection, stenosis (50% or greater) or occlusion. Mild non stenotic calcific atherosclerosis of the carotid bifurcation. Vertebral arteries: Right dominant. No evidence of dissection, stenosis (50% or greater) or occlusion. Skeleton: Mild cervical spondylosis. No high-grade bony canal stenosis. Other neck: Negative. Upper chest: Negative. Review of the MIP images confirms the above findings CTA HEAD FINDINGS Anterior circulation: No significant stenosis, proximal occlusion, aneurysm, or vascular malformation. Calcific atherosclerosis of carotid siphons without stenosis. Posterior circulation: Short segment of severe stenosis/near occlusion of the left PICA (series 10, image 244). Stable left distal P1 segment of severe stenosis. Otherwise no significant stenosis, proximal occlusion, aneurysm, or vascular malformation. Venous sinuses: As permitted by contrast timing, patent. Anatomic variants: Small anterior and probable diminutive bilateral posterior communicating arteries. Delayed phase: No abnormal intracranial enhancement. Review of the MIP images confirms the above findings IMPRESSION: 1. Patent carotid and vertebral arteries. No dissection, aneurysm, or hemodynamically significant stenosis utilizing NASCET criteria. 2. Patent anterior and posterior intracranial circulation. 3. 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 Otherwise no large vessel occlusion, aneurysm, or significant stenosis. 4. No acute abnormality on the noncontrast CT of head identified. No abnormal enhancement of the brain. 5. Stable advanced chronic microvascular ischemic changes and parenchymal volume loss of the brain. Electronically Signed   By: LKristine GarbeM.D.   On: 05/18/2017 21:10  Ct Angio Neck W And/or Wo Contrast  Result Date:  05/18/2017 CLINICAL DATA:  77 y/o  F; right-sided weakness and slurred speech. EXAM: CT ANGIOGRAPHY HEAD AND NECK TECHNIQUE: Multidetector CT imaging of the head and neck was performed using the standard protocol during bolus administration of intravenous contrast. Multiplanar CT image reconstructions and MIPs were obtained to evaluate the vascular anatomy. Carotid stenosis measurements (when applicable) are obtained utilizing NASCET criteria, using the distal internal carotid diameter as the denominator. CONTRAST:  48m ISOVUE-370 IOPAMIDOL (ISOVUE-370) INJECTION 76% COMPARISON:  11/03/2014 MRI and MRA of the head. 11/03/2014 CT of the head. FINDINGS: CT HEAD FINDINGS Brain: No evidence of acute infarction, hemorrhage, hydrocephalus, extra-axial collection or mass lesion/mass effect. Stable advanced chronic microvascular ischemic changes and parenchymal volume loss of the brain Vascular: As below. Skull: Normal. Negative for fracture or focal lesion. Sinuses: Partial right mastoid opacification.  Otherwise negative. Orbits: No acute finding. Review of the MIP images confirms the above findings CTA NECK FINDINGS Aortic arch: Left vertebral artery arises from the aorta. Imaged portion shows no evidence of aneurysm or dissection. No significant stenosis of the major arch vessel origins. Calcific atherosclerosis of the aortic arch. Right carotid system: No evidence of dissection, stenosis (50% or greater) or occlusion. Mild non stenotic calcific atherosclerosis of the carotid bifurcation. Left carotid system: No evidence of dissection, stenosis (50% or greater) or occlusion. Mild non stenotic calcific atherosclerosis of the carotid bifurcation. Vertebral arteries: Right dominant. No evidence of dissection, stenosis (50% or greater) or occlusion. Skeleton: Mild cervical spondylosis. No high-grade bony canal stenosis. Other neck: Negative. Upper chest: Negative. Review of the MIP images confirms the above findings CTA  HEAD FINDINGS Anterior circulation: No significant stenosis, proximal occlusion, aneurysm, or vascular malformation. Calcific atherosclerosis of carotid siphons without stenosis. Posterior circulation: Short segment of severe stenosis/near occlusion of the left PICA (series 10, image 244). Stable left distal P1 segment of severe stenosis. Otherwise no significant stenosis, proximal occlusion, aneurysm, or vascular malformation. Venous sinuses: As permitted by contrast timing, patent. Anatomic variants: Small anterior and probable diminutive bilateral posterior communicating arteries. Delayed phase: No abnormal intracranial enhancement. Review of the MIP images confirms the above findings IMPRESSION: 1. Patent carotid and vertebral arteries. No dissection, aneurysm, or hemodynamically significant stenosis utilizing NASCET criteria. 2. Patent anterior and posterior intracranial circulation. 3. 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 Otherwise no large vessel occlusion, aneurysm, or significant stenosis. 4. No acute abnormality on the noncontrast CT of head identified. No abnormal enhancement of the brain. 5. Stable advanced chronic microvascular ischemic changes and parenchymal volume loss of the brain. Electronically Signed   By: LKristine GarbeM.D.   On: 05/18/2017 21:10    Pending Labs Unresulted Labs (From admission, onward)   Start     Ordered   05/19/17 0500  Hemoglobin A1c  Tomorrow morning,   R     05/18/17 2213   05/19/17 0500  Lipid panel  Tomorrow morning,   R    Comments:  Fasting    05/18/17 2213   05/18/17 1953  Urine rapid drug screen (hosp performed)  STAT,   STAT     05/18/17 1953   05/18/17 1953  Urinalysis, Routine w reflex microscopic  STAT,   STAT     05/18/17 1953      Vitals/Pain Today's Vitals   05/18/17 1916 05/18/17 2150 05/18/17 2200 05/18/17 2245  BP:   136/67 (!) 141/73  Pulse:  63  62 65  Resp:  (!) '21 15 15  '$ Temp:       TempSrc:      SpO2:  97% 98% 99%  Weight:      Height:      PainSc: 0-No pain       Isolation Precautions No active isolations  Medications Medications  sodium chloride 0.9 % injection (has no administration in time range)  iopamidol (ISOVUE-370) 76 % injection (has no administration in time range)  insulin aspart (novoLOG) injection 0-9 Units (has no administration in time range)  amLODipine (NORVASC) tablet 10 mg (has no administration in time range)  memantine (NAMENDA) tablet 10 mg (has no administration in time range)  clorazepate (TRANXENE) tablet 3.75 mg (has no administration in time range)  Calcium Carbonate-Vitamin D 600-400 MG-UNIT 1 tablet (has no administration in time range)  pantoprazole (PROTONIX) EC tablet 40 mg (has no administration in time range)  pravastatin (PRAVACHOL) tablet 20 mg (has no administration in time range)  sodium chloride tablet 1 g (has no administration in time range)  mirtazapine (REMERON) tablet 30 mg (has no administration in time range)   stroke: mapping our early stages of recovery book (has no administration in time range)  acetaminophen (TYLENOL) tablet 650 mg (has no administration in time range)    Or  acetaminophen (TYLENOL) solution 650 mg (has no administration in time range)    Or  acetaminophen (TYLENOL) suppository 650 mg (has no administration in time range)  enoxaparin (LOVENOX) injection 40 mg (has no administration in time range)  aspirin suppository 300 mg (has no administration in time range)    Or  aspirin tablet 325 mg (has no administration in time range)  iopamidol (ISOVUE-370) 76 % injection 100 mL (80 mLs Intravenous Contrast Given 05/18/17 2028)    Mobility walks

## 2017-05-18 NOTE — Consult Note (Signed)
Neurology Consultation  Reason for Consult: Right-sided weakness, speech disturbance-resolved Referring Physician: Dr. Kirby Crigler long daily.  CC: 2 episodes of right-sided weakness and speech disturbance that resolved.  History is obtained from: Patient, husband, chart  HPI: Autumn Schneider is a 77 y.o. female past history of hypertension, hypercholesterolemia, TIA in the past with no residual deficits, diabetes, dementia, who was in her usual state of health until about noon today when she had an episode that lasted 10 minutes involving right-sided weakness and difficulty with word finding.  The patient's husband reports that she was normal when she woke up and had 2 episodes lasting 5 to 10 minutes of speech disturbance and right-sided weakness, once at home and once when they were at [facility].  Both the symptoms resolved spontaneously without intervention.  She had had similar symptoms 10 years ago and was diagnosed with a TIA.  They were aware of the symptoms being strokelike and came into the ER for evaluation. She denies any current pain, numbness, weakness.  Denies any vision problems.  Denies any speech difficulties currently.  Denies any preceding flulike symptoms.  Denies fevers or chills.  Denies chest pain, palpations.  Denies cough or shortness of breath.  Denies abdominal pain nausea vomiting.  Denied easy bruising or bleeding tendencies.  Denied any old strokes with residual weakness.  Reported similar symptoms 10 years ago that were labeled as TIA at that time.  No history of seizure activity. Evaluated in the emergency room at [facility] with a CT of the head and a CT angiogram head and neck, which were both unremarkable for any acute event. Neurological consultation for further recommendations on work-up.  LKW: 4 PM tpa given?: no, times resolved-- NIH 0 Premorbid modified Rankin scale (mRS):2-Slight disability-UNABLE to perform all activities but does not need  assistance   ROS: ROS was performed and is negative except as noted in the HPI.    Past Medical History:  Diagnosis Date  . Alzheimer disease    diagnosed 12/12  . Anxiety and depression   . Dementia   . Diabetes mellitus   . Esophageal reflux   . High cholesterol   . Hypertension   . Hyponatremia   . Migraine   . Osteoporosis   . SIADH (syndrome of inappropriate ADH production) (HCC)   . TIA (transient ischemic attack)   . Vitamin D deficiency    Family History  Problem Relation Age of Onset  . Stroke Mother   . COPD Mother   . Diabetes Sister   . Breast cancer Sister   . Fibromyalgia Sister   . Stroke Maternal Grandmother   . CVA Maternal Grandmother   . Heart attack Maternal Grandfather     Social History:   reports that she has never smoked. She does not have any smokeless tobacco history on file. She reports that she does not drink alcohol or use drugs. Never smoker.  Denies illicit drug use.  Denies alcohol abuse  Medications  Current Facility-Administered Medications:  .   stroke: mapping our early stages of recovery book, , Does not apply, Once, Julian Reil, Jared M, DO .  acetaminophen (TYLENOL) tablet 650 mg, 650 mg, Oral, Q4H PRN **OR** acetaminophen (TYLENOL) solution 650 mg, 650 mg, Per Tube, Q4H PRN **OR** acetaminophen (TYLENOL) suppository 650 mg, 650 mg, Rectal, Q4H PRN, Hillary Bow, DO .  [START ON 05/19/2017] amLODipine (NORVASC) tablet 10 mg, 10 mg, Oral, Daily, Lyda Perone M, DO .  [START ON 05/19/2017] aspirin  suppository 300 mg, 300 mg, Rectal, Daily **OR** [START ON 05/19/2017] aspirin tablet 325 mg, 325 mg, Oral, Daily, Julian Reil, Jared M, DO .  [START ON 05/19/2017] Calcium Carbonate-Vitamin D 600-400 MG-UNIT 1 tablet, 1 tablet, Oral, Daily, Julian Reil, Jared M, DO .  clorazepate (TRANXENE) tablet 3.75 mg, 3.75 mg, Oral, BID, Julian Reil, Jared M, DO .  enoxaparin (LOVENOX) injection 40 mg, 40 mg, Subcutaneous, Q24H, Julian Reil, Jared M, DO .  [START ON  05/19/2017] insulin aspart (novoLOG) injection 0-9 Units, 0-9 Units, Subcutaneous, TID WC, Julian Reil, Jared M, DO .  iopamidol (ISOVUE-370) 76 % injection, , , ,  .  memantine (NAMENDA) tablet 10 mg, 10 mg, Oral, BID, Julian Reil, Jared M, DO .  mirtazapine (REMERON) tablet 30 mg, 30 mg, Oral, QHS, Julian Reil, Jared M, DO .  [START ON 05/19/2017] pantoprazole (PROTONIX) EC tablet 40 mg, 40 mg, Oral, Daily, Julian Reil, Jared M, DO .  [START ON 05/19/2017] pravastatin (PRAVACHOL) tablet 20 mg, 20 mg, Oral, q1800, Hillary Bow, DO .  sodium chloride 0.9 % injection, , , ,  .  [START ON 05/19/2017] sodium chloride tablet 1 g, 1 g, Oral, BID WC, Hillary Bow, DO  Current Outpatient Medications:  .  amLODipine (NORVASC) 10 MG tablet, Take 10 mg by mouth daily., Disp: , Rfl:  .  aspirin EC 81 MG tablet, Take 81 mg by mouth daily., Disp: , Rfl:  .  Calcium Carbonate-Vitamin D (CALTRATE 600+D) 600-400 MG-UNIT per tablet, Take 1 tablet by mouth daily., Disp: , Rfl:  .  clorazepate (TRANXENE) 3.75 MG tablet, Take 3.75 mg by mouth 2 (two) times daily. , Disp: , Rfl:  .  denosumab (PROLIA) 60 MG/ML SOSY injection, Inject 60 mg into the skin every 6 (six) months., Disp: , Rfl:  .  Melatonin 10 MG TABS, Take 1 tablet by mouth at bedtime., Disp: , Rfl:  .  memantine (NAMENDA) 10 MG tablet, Take 10 mg by mouth 2 (two) times daily., Disp: , Rfl:  .  metFORMIN (GLUCOPHAGE) 500 MG tablet, Take 1,000 mg by mouth 2 (two) times daily. , Disp: , Rfl:  .  mirtazapine (REMERON) 30 MG tablet, Take 30 mg by mouth at bedtime., Disp: , Rfl: 0 .  pantoprazole (PROTONIX) 40 MG tablet, Take 40 mg by mouth daily., Disp: , Rfl:  .  pravastatin (PRAVACHOL) 20 MG tablet, TK 1 T PO QD, Disp: , Rfl: 0 .  sodium chloride 1 G tablet, Take 1 g by mouth 2 (two) times daily with a meal. , Disp: , Rfl:  .  ACCU-CHEK SMARTVIEW test strip, , Disp: , Rfl:  .  amLODipine (NORVASC) 10 MG tablet, Take 1 tablet (10 mg total) by mouth daily., Disp: 30  tablet, Rfl: 0  Exam: Current vital signs: BP (!) 141/73   Pulse 65   Temp 98 F (36.7 C) (Oral)   Resp 15   Ht  (1.499 m)   Wt 52.6 kg (116 lb)   SpO2 99%   BMI 23.43 kg/m  Vital signs in last 24 hours: Temp:  [98 F (36.7 C)] 98 F (36.7 C) (05/02 1903) Pulse Rate:  [62-70] 65 (05/02 2245) Resp:  [15-21] 15 (05/02 2245) BP: (136-166)/(67-73) 141/73 (05/02 2245) SpO2:  [97 %-99 %] 99 % (05/02 2245) Weight:  [52.6 kg (116 lb)] 52.6 kg (116 lb) (05/02 1903)  GENERAL: Awake, alert in NAD HEENT: - Normocephalic and atraumatic, dry mm, no LN++, no Thyromegally LUNGS - Clear to auscultation bilaterally with  no wheezes CV - S1S2 RRR, no m/r/g, equal pulses bilaterally. ABDOMEN - Soft, nontender, nondistended with normoactive BS Ext: warm, well perfused, intact peripheral pulses, no edema  NEURO:  Mental Status: AA&Ox3  Language: speech is non-dysarthric.  Naming, repetition, fluency, and comprehension intact. Cranial Nerves: PERRL. EOMI, visual fields full, no facial asymmetry,facial sensation intact, hearing intact, tongue/uvula/soft palate midline, normal sternocleidomastoid and trapezius muscle strength. No evidence of tongue atrophy or fibrillations Motor: 5/5 all over.  No vertical drift noted in any extremity. Tone: is normal and bulk is normal Sensation- Intact to light touch bilaterally Coordination: FTN intact bilaterally, no ataxia in BLE. Gait- deferred  NIHSS-0   Labs I have reviewed labs in epic and the results pertinent to this consultation are:   CBC    Component Value Date/Time   WBC 6.5 05/18/2017 2002   RBC 3.57 (L) 05/18/2017 2002   HGB 11.9 (L) 05/18/2017 2003   HCT 35.0 (L) 05/18/2017 2003   PLT 285 05/18/2017 2002   MCV 97.5 05/18/2017 2002   MCH 31.9 05/18/2017 2002   MCHC 32.8 05/18/2017 2002   RDW 12.7 05/18/2017 2002   LYMPHSABS 1.9 05/18/2017 2002   MONOABS 0.5 05/18/2017 2002   EOSABS 0.4 05/18/2017 2002   BASOSABS 0.0  05/18/2017 2002    CMP     Component Value Date/Time   NA 138 05/18/2017 2003   K 4.2 05/18/2017 2003   CL 100 (L) 05/18/2017 2003   CO2 24 05/18/2017 2002   GLUCOSE 164 (H) 05/18/2017 2003   BUN 22 (H) 05/18/2017 2003   CREATININE 1.00 05/18/2017 2003   CALCIUM 9.3 05/18/2017 2002   PROT 7.4 05/18/2017 2002   ALBUMIN 4.0 05/18/2017 2002   AST 17 05/18/2017 2002   ALT 11 (L) 05/18/2017 2002   ALKPHOS 40 05/18/2017 2002   BILITOT 0.6 05/18/2017 2002   GFRNONAA 49 (L) 05/18/2017 2002   GFRAA 56 (L) 05/18/2017 2002   Imaging I have reviewed the images obtained: CT-scan of the brain-no acute changes, moderate atrophy and WM disease. CTA: No LVO  Assessment:  77 year old woman with multiple cerebrovascular risk factors presenting for evaluation of 2 distinct episodes of right-sided weakness and word finding difficulty/speech disturbance. Symptoms concerning for left hemispheric TIA/stroke. Currently back to baseline in terms of her clinical exam.  NIH 0 Outside the window for IV TPA as well as low NIH, hence not a candidate. No cortical signs and no LVO on CT angiogram, hence not a candidate for endovascular.  Impression: Evaluate for left hemispheric TIA/stroke  Recommendations: -Admit to telemetry at [facility] -Allow for permissive hypertension for the first 24-48h - only treat PRN if SBP >220 mmHg. Blood pressures can be gradually normalized to SBP<140 upon discharge. -Frequent neurochecks -Aspirin 325 p.o. daily, atorvastatin 80 p.o. Daily.  Can consider switching to Plavix as she is currently on aspirin 81 and had the symptoms. -MRI brain without contrast -2D echocardiogram -Hemoglobin A1c -Fasting lipid panel -Physical therapy, occupational therapy, speech therapy -N.p.o. until cleared by bedside eval or speech therapy eval. -Risk factor modification -Stroke team to follow in the a.m. upon transfer to [facility].  Please page stroke NP/PA/MD (listed on  AMION)  from 8am-4 pm as this patient will be followed by the stroke team at this point.  -- Milon Dikes, MD [facility] Pager: [phone] If 7pm to 7am, please call on call as listed on AMION.

## 2017-05-18 NOTE — ED Notes (Signed)
[facility] called. 

## 2017-05-18 NOTE — ED Notes (Signed)
[facility] arrived  

## 2017-05-18 NOTE — ED Triage Notes (Signed)
Pt husband reports that they were in [employer] today around noon and she suddenly started experiencing R side weakness and slurred speech. They report that it eased off moments later. Around 430, she started experiencing the symptoms again. She states that her symptoms have resolved now. She has dementia at baseline.

## 2017-05-19 ENCOUNTER — Inpatient Hospital Stay (HOSPITAL_COMMUNITY): Payer: Medicare Other

## 2017-05-19 ENCOUNTER — Encounter (HOSPITAL_COMMUNITY): Payer: Self-pay

## 2017-05-19 DIAGNOSIS — G309 Alzheimer's disease, unspecified: Secondary | ICD-10-CM | POA: Diagnosis not present

## 2017-05-19 DIAGNOSIS — E119 Type 2 diabetes mellitus without complications: Secondary | ICD-10-CM | POA: Diagnosis not present

## 2017-05-19 DIAGNOSIS — I10 Essential (primary) hypertension: Secondary | ICD-10-CM | POA: Diagnosis not present

## 2017-05-19 DIAGNOSIS — G459 Transient cerebral ischemic attack, unspecified: Secondary | ICD-10-CM | POA: Diagnosis not present

## 2017-05-19 DIAGNOSIS — F0280 Dementia in other diseases classified elsewhere without behavioral disturbance: Secondary | ICD-10-CM | POA: Diagnosis not present

## 2017-05-19 DIAGNOSIS — I361 Nonrheumatic tricuspid (valve) insufficiency: Secondary | ICD-10-CM

## 2017-05-19 DIAGNOSIS — E7800 Pure hypercholesterolemia, unspecified: Secondary | ICD-10-CM | POA: Diagnosis not present

## 2017-05-19 LAB — GLUCOSE, CAPILLARY
Glucose-Capillary: 153 mg/dL — ABNORMAL HIGH (ref 65–99)
Glucose-Capillary: 129 mg/dL — ABNORMAL HIGH (ref 65–99)
Glucose-Capillary: 127 mg/dL — ABNORMAL HIGH (ref 65–99)

## 2017-05-19 LAB — LIPID PANEL
Cholesterol: 128 mg/dL (ref 0–200)
HDL: 37 mg/dL — ABNORMAL LOW (ref >40)
LDL Cholesterol: 78 mg/dL (ref 0–99)
Total CHOL/HDL Ratio: 3.5 RATIO
Triglycerides: 63 mg/dL (ref <150)
VLDL: 13 mg/dL (ref 0–40)

## 2017-05-19 LAB — HEMOGLOBIN A1C
Hgb A1c MFr Bld: 6.6 % — ABNORMAL HIGH (ref 4.8–5.6)
Mean Plasma Glucose: 142.72 mg/dL

## 2017-05-19 LAB — ECHOCARDIOGRAM COMPLETE
Height: 59 in
Weight: 1846.4 oz

## 2017-05-19 MED ORDER — CLOPIDOGREL BISULFATE 75 MG PO TABS: 75.0000 mg | ORAL_TABLET | Freq: Every day | ORAL | 0 refills | Status: DC

## 2017-05-19 MED ORDER — CLOPIDOGREL BISULFATE 75 MG PO TABS: 300.0000 mg | ORAL_TABLET | Freq: Once | ORAL | Status: DC

## 2017-05-19 MED ORDER — CLOPIDOGREL BISULFATE 75 MG PO TABS: 75.0000 mg | ORAL_TABLET | Freq: Every day | ORAL | Status: DC

## 2017-05-19 MED ORDER — ENSURE ENLIVE PO LIQD: 237.0000 mL | Freq: Two times a day (BID) | ORAL | 12 refills | Status: AC

## 2017-05-19 MED ORDER — ATORVASTATIN CALCIUM 80 MG PO TABS: 80.0000 mg | ORAL_TABLET | Freq: Every day | ORAL | 11 refills | Status: AC

## 2017-05-19 MED ORDER — CLOPIDOGREL BISULFATE 75 MG PO TABS
75.0000 mg | ORAL_TABLET | Freq: Every day | ORAL | Status: DC
  Administered 2017-05-19: 75 mg via ORAL
  Filled 2017-05-19: qty 1

## 2017-05-19 MED ORDER — ASPIRIN EC 81 MG PO TBEC
81.0000 mg | DELAYED_RELEASE_TABLET | Freq: Every day | ORAL | Status: DC
  Administered 2017-05-19: 81 mg via ORAL
  Filled 2017-05-19: qty 1

## 2017-05-19 MED ORDER — ASPIRIN EC 325 MG PO TBEC: 325.0000 mg | DELAYED_RELEASE_TABLET | Freq: Every day | ORAL | 3 refills | Status: DC

## 2017-05-19 MED ORDER — ENSURE ENLIVE PO LIQD: 237.0000 mL | Freq: Two times a day (BID) | ORAL | Status: DC

## 2017-05-19 NOTE — Progress Notes (Addendum)
NEUROHOSPITALISTS STROKE TEAM - DAILY PROGRESS NOTE    HISTORY Autumn Schneider is a 77 y.o. female past history of hypertension, hypercholesterolemia, TIA in the past with no residual deficits, diabetes, dementia, who was in her usual state of health until about noon today when she had an episode that lasted 10 minutes involving right-sided weakness and difficulty with word finding.  The patient's husband reports that she was normal when she woke up and had 2 episodes lasting 5 to 10 minutes of speech disturbance and right-sided weakness, once at home and once when they were at [facility].  Both the symptoms resolved spontaneously without intervention.  She had had similar symptoms 10 years ago and was diagnosed with a TIA.  They were aware of the symptoms being strokelike and came into the ER for evaluation. She denies any current pain, numbness, weakness.  Denies any vision problems.  Denies any speech difficulties currently.  Denies any preceding flulike symptoms.  Denies fevers or chills.  Denies chest pain, palpations.  Denies cough or shortness of breath.  Denies abdominal pain nausea vomiting.  Denied easy bruising or bleeding tendencies.  Denied any old strokes with residual weakness.  Reported similar symptoms 10 years ago that were labeled as TIA at that time.  No history of seizure activity. Evaluated in the emergency room at [facility] with a CT of the head and a CT angiogram head and neck, which were both unremarkable for any acute event. Neurological consultation for further recommendations on work-up.  SUBJECTIVE Patient seen in bed with family at bedside, symptoms have since resolved.  Per family patient has baseline Alzheimer's dementia, and currently confused.  Patient denies headache, dizziness, muscle weakness, ocular pain, vision changes, lightheadedness, nausea or vomiting.  OBJECTIVE Most recent Vital Signs: Vitals:   05/18/17 2200 05/18/17 2245 05/18/17 2330 05/19/17 0551  BP: 136/67 (!) 141/73 136/71 (!) 150/62  Pulse: 62 65 (!) 58 (!) 59  Resp: Temp:    98.1 F (36.7 C)  TempSrc:    Oral  SpO2: 98% 99% 98% 96%  Weight:    52.3 kg (115 lb 6.4 oz)  Height:     (1.499 m)   CBG (last 3)  Recent Labs    05/19/17 0108 05/19/17 0734 05/19/17 1208  GLUCAP 153* 129* 127*    Physical Exam  HEENT-  Normocephalic, no lesions, without obvious abnormality.  Normal external eye and conjunctiva.   Cardiovascular-  Bradycardic S1-S2 audible, pulses palpable throughout   Lungs-no rhonchi or wheezing noted, no excessive working breathing.  Saturations within normal limits Abdomen- All 4 quadrants palpated and nontender Musculoskeletal-no joint tenderness or edema  Skin-warm and dry,   Neuro:  Mental Status: Alert, oriented, thought content appropriate.  Non-dysarthric, able to follow 3 step commands without difficulty.  Patient easily reoriented, Alzheimer's at baseline. Diminished attention, registration and recall. Cranial Nerves: II:  Visual fields grossly normal,  III,IV, VI: ptosis not present, extra-ocular motions intact bilaterally pupils equal, round, reactive to light  V,VII: smile symmetric, facial light touch sensation normal bilaterally VIII: hearing intact to voice IX,X: uvula rises symmetrically XI: bilateral shoulder shrug XII: midline tongue extension Motor:  Patient also extremities equally and repositions herself in bed Right : Upper extremity   5/5    Left:     Upper extremity   5/5  Lower extremity   5/5     Lower extremity   5/5 Tone and bulk:normal tone throughout; no atrophy noted Sensory: No sensation impairment Deep Tendon Reflexes: 2+ and symmetric throughout Plantars: Right: downgoing   Left: downgoing Cerebellar: normal finger-to-nose, normal rapid alternating movements  Gait: defered  IV Fluid Intake:    MEDICATIONS  . amLODipine  10 mg Oral Daily    . aspirin EC  81 mg Oral Daily  . calcium-vitamin D  1 tablet Oral Daily  . [START ON 05/20/2017] clopidogrel  75 mg Oral Daily  . clorazepate  3.75 mg Oral BID  . enoxaparin (LOVENOX) injection  40 mg Subcutaneous Q24H  . feeding supplement (ENSURE ENLIVE)  237 mL Oral BID BM  . insulin aspart  0-9 Units Subcutaneous TID WC  . memantine  10 mg Oral BID  . mirtazapine  30 mg Oral QHS  . pantoprazole  40 mg Oral Daily  . pravastatin  20 mg Oral q1800  . sodium chloride  1 g Oral BID WC   PRN:  acetaminophen **OR** acetaminophen (TYLENOL) oral liquid 160 mg/5 mL **OR** acetaminophen  Diet:   Diet Order           Diet heart healthy/carb modified Room service appropriate? Yes; Fluid consistency: Thin  Diet effective now        Diet - low sodium heart healthy          CLINICALLY SIGNIFICANT STUDIES Basic Metabolic Panel:  Recent Labs  Lab 05/18/17 2002 05/18/17 2003  NA 138 138  K 4.3 4.2  CL 103 100*  CO2 24  --   GLUCOSE 170* 164*  BUN 22* 22*  CREATININE 1.08* 1.00  CALCIUM 9.3  --    Liver Function Tests:  Recent Labs  Lab 05/18/17 2002  AST 17  ALT 11*  ALKPHOS 40  BILITOT 0.6  PROT 7.4  ALBUMIN 4.0   CBC:  Recent Labs  Lab 05/18/17 2002 05/18/17 2003  WBC 6.5  --   NEUTROABS 3.7  --   HGB 11.4* 11.9*  HCT 34.8* 35.0*  MCV 97.5  --   PLT 285  --    Coagulation:  Recent Labs  Lab 05/18/17 2002  LABPROT 12.4  INR 0.93   Lipid Panel    Component Value Schneider/Time   CHOL 128 05/19/2017 0743   TRIG 63 05/19/2017 0743   HDL 37 (L) 05/19/2017 0743   CHOLHDL 3.5 05/19/2017 0743   VLDL 13 05/19/2017 0743   LDLCALC 78 05/19/2017 0743   HgbA1C  Lab Results  Component Value Schneider   HGBA1C 6.6 (H) 05/19/2017    Urine Drug Screen:      Component Value Schneider/Time   LABOPIA NONE DETECTED 11/03/2014 1243   COCAINSCRNUR NONE DETECTED 11/03/2014 1243   LABBENZ POSITIVE (A) 11/03/2014 1243   AMPHETMU NONE DETECTED 11/03/2014 1243   THCU NONE  DETECTED 11/03/2014 1243   LABBARB NONE DETECTED 11/03/2014 1243    Alcohol Level:  Recent Labs  Lab 05/18/17 2002  ETH <10    Ct Angio Head/ Neck W Or Wo Contrast  05/18/2017 IMPRESSION:  1. Patent carotid and vertebral arteries. No dissection, aneurysm, or hemodynamically significant stenosis utilizing NASCET criteria.  2. Patent anterior and posterior intracranial circulation.  3. 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 Otherwise no large vessel occlusion, aneurysm, or significant stenosis.  4. No acute abnormality on the noncontrast CT of head identified. No abnormal enhancement of the brain.  5. Stable advanced chronic microvascular ischemic changes and parenchymal volume loss of the brain.   Mr Brain Wo Contrast  05/19/2017 IMPRESSION: Negative for acute infarct Moderate atrophy and moderate chronic microvascular ischemic change.   ECHO  05/19/17 - Left ventricle: The cavity size was normal. Wall thickness was   increased in a pattern of moderate LVH. Systolic function was   normal. The estimated ejection fraction was in the range of 60%   to 65%. Wall motion was normal; there were no regional wall   motion abnormalities. Left ventricular diastolic function   parameters were normal. - Mitral valve: Calcified annulus. - Atrial septum: No defect or patent foramen ovale was identified. EF 60-65%  EKG  NSR   Outstanding Stroke Work-up Studies:       Therapy Recommendations : No continued rehab    ASSESSMENT 77 year old woman with multiple cerebrovascular risk factors presenting for evaluation of 2 distinct episodes of right-sided weakness and word finding difficulty/speech disturbance. Symptoms concerning for left hemispheric TIA/stroke. Currently back to baseline in terms of her clinical exam.  NIH 0 Outside the window for IV TPA as well as low NIH, hence not a candidate. No cortical signs and no LVO on CT angiogram, hence not a candidate  for endovascular.  1.  Left hemispheric TIA with presenting symptoms of right-sided weakness and word finding difficulties, speech alterations which has since resolved. Etiology likely small vessel disease.  Head imaging MRI of the brain was negative for acute infarct but did show moderate atrophy chronic microvascular ischemic changes, CT of the head and neck did not show any acute changes.  Echocardiogram to evaluate embolic source EF of 60 to 65% without mention of LV thrombus.  Due to patient with presenting symptoms while on aspirin, secondary stroke prevention, we will continue dual antiplatelets with aspirin 81 mg and Plavix 75 mg daily for 3 weeks, then resume Plavix alone.  Patient has been evaluated by rehab therapy and currently they do not recommend further therapies. Identification labs showed a hemoglobin A1c of 6.6 and cholesterol 128, triglyceride 63 HDL 37.  Patient to continue home regimen pravastatin  2.  Alzheimer's dementia 3. Diabetes mellitus 4.  Hypertension 5.  Dyslipidemia  Hospital day # 1  PLAN/RECOMMENDATIONS  Allow for permissive hypertension for the first 24-48h - only treat PRN if SBP >220 mmHg. Blood pressures can be gradually normalized to SBP<140 upon discharge.  Telemetry monitoring  HgbA1c, fasting lipid panel  Frequent neuro checks  Antiplatelets aspirin and Plavix.  We then continue Plavix alone   Continue Pravastatin  daily  Risk factor modification  Early Rehab with PT consult, OT consult, Speech consult - No recommendation for continued rehab        PT to follow up with [facility]rology in 6 weeks        Continue aggressive glycemic management.       Patient stable to discharge home   SIGNED Noralee Space [facility] Neuro-hospitalist Team [phone] 05/19/2017, 2:49 PM   05/19/2017 ATTENDING ASSESSMENT    I have personally examined this patient, reviewed notes, independently viewed imaging studies, participated in medical decision  making and plan of care.ROS completed by me personally and pertinent positives fully documented  I have made any additions or clarifications directly to the above note.  Agree with note above. She presented with transient speech difficulties and right hemiparesis likely left hemispheric TIA. Patient is not a good long-term anticoagulation candidate given history of moderate dementia and not pursue further workup. Recommend dual antiplatelet therapy for 3 weeks followed by Plavix alone. No family available at the bedside for discussion. Greater than 50% time during this 25 minute visit was spent on counseling and coordination of care about TIA and stroke prevention  Delia Heady, MD Medical Director [facility] Pager: [phone] 05/19/2017 4:15 PM    To contact Stroke Continuity provider, please refer to [email or web address]. After hours, contact General Neurology

## 2017-05-19 NOTE — Progress Notes (Addendum)
Initial Nutrition Assessment  DOCUMENTATION CODES:   Not applicable  INTERVENTION:    Ensure Enlive po BID, each supplement provides 350 kcal and 20 grams of protein  Recommend liberalize diet to Regular  NUTRITION DIAGNOSIS:   Inadequate oral intake related to chronic illness(Alzheimer's dementia) as evidenced by per patient/family report.  GOAL:   Patient will meet greater than or equal to 90% of their needs  MONITOR:   PO intake, Supplement acceptance, Labs  REASON FOR ASSESSMENT:   Malnutrition Screening Tool    ASSESSMENT:   77 yo female with PMH of DM, Alzheimer's DZ, HTN, HLD, TIA, hyponatremia, vitamin D deficiency, reflux, osteoporosis, and SIADH who was admitted on 5/2 with TIA.   Patient reports fair intake recently. She likes to drink strawberry Ensure supplements. She has lost weight, unsure of amount. She was unable to provide much nutrition hx.  Labs reviewed. CBG's: 208-129 Medications reviewed and include Oscal with D, Remeron, Lipitor.  NUTRITION - FOCUSED PHYSICAL EXAM:    Most Recent Value  Orbital Region  No depletion  Upper Arm Region  No depletion  Thoracic and Lumbar Region  Unable to assess  Buccal Region  No depletion  Temple Region  Mild depletion  Clavicle Bone Region  Moderate depletion  Clavicle and Acromion Bone Region  Mild depletion  Scapular Bone Region  Mild depletion  Dorsal Hand  Mild depletion  Patellar Region  Mild depletion  Anterior Thigh Region  Mild depletion  Posterior Calf Region  Mild depletion  Edema (RD Assessment)  None  Hair  Reviewed  Eyes  Reviewed  Mouth  Reviewed  Skin  Reviewed  Nails  Reviewed       Diet Order:   Diet Order           Diet heart healthy/carb modified Room service appropriate? Yes; Fluid consistency: Thin  Diet effective now          EDUCATION NEEDS:   No education needs have been identified at this time  Skin:  Skin Assessment: Reviewed RN Assessment  Last BM:   5/2  Height:   Ht Readings from Last 1 Encounters:  05/19/17  (1.499 m)    Weight:   Wt Readings from Last 1 Encounters:  05/19/17 115 lb 6.4 oz (52.3 kg)    Ideal Body Weight:  44.7 kg  BMI:  Body mass index is 23.31 kg/m.  Estimated Nutritional Needs:   Kcal:  1250-1450  Protein:  65-75 gm  Fluid:  1.3-1.5 L    Joaquin Courts, RD, LDN, CNSC Pager [phone] After Hours Pager [phone]

## 2017-05-19 NOTE — Evaluation (Signed)
Occupational Therapy Evaluation Patient Details Name: Autumn Schneider MRN: [id number] DOB: January 09, 1941 Today's Date: 05/19/2017    History of Present Illness Pt adm with rt sided weakness that resolved spontaneously. CT and MRI negative. PMH - Alzheimers, HTN, DM, TIA   Clinical Impression   Pt at baseline level if function with ADLs with some assist from husband and caregiver ron Wednesdays for showers. Pt ambulated with no AD, no balance deficits. Pt with hx of dementia and husband is primary caregiver. All education completed and no further acute OT is indicated at this time    Follow Up Recommendations  No OT follow up    Equipment Recommendations  None recommended by OT    Recommendations for Other Services       Precautions / Restrictions Precautions Precautions: None Restrictions Weight Bearing Restrictions: No      Mobility Bed Mobility Overal bed mobility: Independent             General bed mobility comments: pt up in chair  Transfers Overall transfer level: Independent Equipment used: None                  Balance Overall balance assessment: Independent;No apparent balance deficits (not formally assessed)                                         ADL either performed or assessed with clinical judgement   ADL Overall ADL's : At baseline                                       General ADL Comments: with cues, pt's husband assists with ADLs at [facility]. Pt independent with toileting and grooming at sink after set up     Vision Baseline Vision/History: Wears glasses Wears Glasses: Reading only Patient Visual Report: No change from baseline       Perception     Praxis      Pertinent Vitals/Pain Pain Assessment: No/denies pain     Hand Dominance Right   Extremity/Trunk Assessment Upper Extremity Assessment Upper Extremity Assessment: Overall WFL for tasks assessed   Lower Extremity Assessment Lower Extremity  Assessment: Defer to PT evaluation   Cervical / Trunk Assessment Cervical / Trunk Assessment: Normal   Communication Communication Communication: No difficulties   Cognition Arousal/Alertness: Awake/alert Behavior During Therapy: WFL for tasks assessed/performed Overall Cognitive Status: History of cognitive impairments - at baseline                                     General Comments       Exercises     Shoulder Instructions      Home Living Family/patient expects to be discharged to:: Private residence Living Arrangements: Spouse/significant other Available Help at Discharge: Family;Available 24 hours/day Type of Home: House Home Access: Stairs to enter [employer] of Steps: 3 Entrance Stairs-Rails: Right Home Layout: One level     Bathroom Shower/Tub: Producer, television/film/video: Standard Bathroom Accessibility: Yes   Home Equipment: Shower seat          Prior Functioning/Environment Level of Independence: Needs assistance  Gait / Transfers Assistance Needed: Independent with all mobility  OT Problem List: Decreased activity tolerance;Decreased cognition      OT Treatment/Interventions:      OT Goals(Current goals can be found in the care plan section) Acute Rehab OT Goals Patient Stated Goal: go home OT Goal Formulation: With patient/family  OT Frequency:     Barriers to D/C:    no barriers       Co-evaluation              AM-PAC PT "6 Clicks" Daily Activity     Outcome Measure Help from another person eating meals?: None Help from another person taking care of personal grooming?: None Help from another person toileting, which includes using toliet, bedpan, or urinal?: None Help from another person bathing (including washing, rinsing, drying)?: A Little Help from another person to put on and taking off regular upper body clothing?: A Little Help from another person to put on and taking off  regular lower body clothing?: A Little 6 Click Score: 21   End of Session    Activity Tolerance: Patient tolerated treatment well Patient left: in chair;with call bell/phone within reach;with family/visitor present  OT Visit Diagnosis: Other abnormalities of gait and mobility (R26.89)                Time: 1610-9604 OT Time Calculation (min): 26 min Charges:  OT General Charges $OT Visit: 1 Visit OT Evaluation $OT Eval Low Complexity: 1 Low OT Treatments $Therapeutic Activity: 8-22 mins G-Codes: OT G-codes **NOT FOR INPATIENT CLASS** Functional Assessment Tool Used: AM-PAC 6 Clicks Daily Activity     Galen Manila 05/19/2017, 1:51 PM

## 2017-05-19 NOTE — Progress Notes (Signed)
Patient has passed Stroke swallow study while she was in [place].  Text MD to see if diet can be updated.  Will wait for orders.

## 2017-05-19 NOTE — Care Management CC44 (Signed)
Condition Code 44 Documentation Completed  Patient Details  Name: Autumn Schneider MRN: [id number] Date of Birth: [birth year]   Condition Code 44 given:  Yes Patient signature on Condition Code 44 notice:  Yes Documentation of 2 MD's agreement:  Yes Code 44 added to claim:  Yes    Gala Lewandowsky, RN 05/19/2017, 2:32 PM

## 2017-05-19 NOTE — Care Management Obs Status (Signed)
MEDICARE OBSERVATION STATUS NOTIFICATION   Patient Details  Name: Autumn Schneider MRN: [id number] Date of Birth: July 28, 1940   Medicare Observation Status Notification Given:  Yes    Gala Lewandowsky, RN 05/19/2017, 2:32 PM

## 2017-05-19 NOTE — Discharge Summary (Signed)
Discharge Summary  Maryanne Siegmann WJX:[id number] DOB: [birth year]  PCP: Merri Brunette, MD  Admit date: 05/18/2017 Discharge date: 05/19/2017   Time spent: < 25 minutes  Admitted From: Home Disposition: Home  Recommendations for Outpatient Follow-up:  1. Follow up with PCP in 1-2 weeks 2. Take Aspirin 81 mg and Plavix 75 mg for three weeks. Then take Plavix 75 mg alone until seen by neurology in 6 weeks.  3. Follow up with Neurology in 6 weeks   Home Health:NO Equipment/Devices:NO  Discharge Diagnoses:  Active Hospital Problems   Diagnosis Date Noted  . TIA (transient ischemic attack) 03/07/2011  . Alzheimer's dementia 11/03/2014  . Diabetes mellitus type 2, uncontrolled (HCC) 11/03/2014  . Hypercholesteremia 11/03/2014  . Hypertension 05/17/2007    Resolved Hospital Problems  No resolved problems to display.    Discharge Condition: Stable CODE STATUS:DNR Diet recommendation: Heart Healthy / Carb Modified / Regular / Dysphagia   Vitals:   05/18/17 2330 05/19/17 0551  BP: 136/71 (!) 150/62  Pulse: (!) 58 (!) 59  Resp: 15   Temp:  98.1 F (36.7 C)  SpO2: 98% 96%    History of present illness:  Autumn Schneider is a 77 y.o. year old female with medical history significant for Alzheimier's disease, SIADH, DM2, HTN who presented on 05/18/2017 with R sided weakness and slurred speech while out with her husband that again reoccured while at home and was found to have TIA. Remaining hospital course addressed in problem based format below:   Hospital Course:  Principal Problem:   TIA (transient ischemic attack) Active Problems:   Hypertension   Diabetes mellitus type 2, uncontrolled (HCC)   Alzheimer's dementia   Hypercholesteremia   1.  TIA.  Transient right-sided weakness.  No acute infarct on MRI brain.  CTA head/neck patent carotid and vertebral arteries.  Noted severe left PICA stenosis.  Preserved EF on TTE with no wall motion normalities and no LV thrombus.  Due to  symptoms occuring while on aspirin for secondary stroke prevention neurology recommended trying dual antiplatelet therapy with aspirin 81 mg and Plavix 75 mg daily.  Instructed husband to continue dual regimen for 3 weeks followed by Plavix alone.  PT/OT did not recommend further therapies.  Risk stratify labs: A1c 6.6, LDL 128, triglycerides 63, HDL 37.  Outpatient follow-up with neurology arranged.  Right-sided weakness resolved on day of discharge.    Antibiotics:none  Microbiology:none  Consultations:  Neurology   Procedures/Studies: TTE on 5/3: EF 60 to 65%, no LV thrombus, normal wall motion  Ct Angio Head W Or Wo Contrast  Result Date: 05/18/2017 CLINICAL DATA:  77 y/o  F; right-sided weakness and slurred speech. EXAM: CT ANGIOGRAPHY HEAD AND NECK TECHNIQUE: Multidetector CT imaging of the head and neck was performed using the standard protocol during bolus administration of intravenous contrast. Multiplanar CT image reconstructions and MIPs were obtained to evaluate the vascular anatomy. Carotid stenosis measurements (when applicable) are obtained utilizing NASCET criteria, using the distal internal carotid diameter as the denominator. CONTRAST:  80mL ISOVUE-370 IOPAMIDOL (ISOVUE-370) INJECTION 76% COMPARISON:  11/03/2014 MRI and MRA of the head. 11/03/2014 CT of the head. FINDINGS: CT HEAD FINDINGS Brain: No evidence of acute infarction, hemorrhage, hydrocephalus, extra-axial collection or mass lesion/mass effect. Stable advanced chronic microvascular ischemic changes and parenchymal volume loss of the brain Vascular: As below. Skull: Normal. Negative for fracture or focal lesion. Sinuses: Partial right mastoid opacification.  Otherwise negative. Orbits: No acute finding. Review of the MIP images confirms  the above findings CTA NECK FINDINGS Aortic arch: Left vertebral artery arises from the aorta. Imaged portion shows no evidence of aneurysm or dissection. No significant stenosis of the  major arch vessel origins. Calcific atherosclerosis of the aortic arch. Right carotid system: No evidence of dissection, stenosis (50% or greater) or occlusion. Mild non stenotic calcific atherosclerosis of the carotid bifurcation. Left carotid system: No evidence of dissection, stenosis (50% or greater) or occlusion. Mild non stenotic calcific atherosclerosis of the carotid bifurcation. Vertebral arteries: Right dominant. No evidence of dissection, stenosis (50% or greater) or occlusion. Skeleton: Mild cervical spondylosis. No high-grade bony canal stenosis. Other neck: Negative. Upper chest: Negative. Review of the MIP images confirms the above findings CTA HEAD FINDINGS Anterior circulation: No significant stenosis, proximal occlusion, aneurysm, or vascular malformation. Calcific atherosclerosis of carotid siphons without stenosis. Posterior circulation: Short segment of severe stenosis/near occlusion of the left PICA (series 10, image 244). Stable left distal P1 segment of severe stenosis. Otherwise no significant stenosis, proximal occlusion, aneurysm, or vascular malformation. Venous sinuses: As permitted by contrast timing, patent. Anatomic variants: Small anterior and probable diminutive bilateral posterior communicating arteries. Delayed phase: No abnormal intracranial enhancement. Review of the MIP images confirms the above findings IMPRESSION: 1. Patent carotid and vertebral arteries. No dissection, aneurysm, or hemodynamically significant stenosis utilizing NASCET criteria. 2. Patent anterior and posterior intracranial circulation. 3. 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 Otherwise no large vessel occlusion, aneurysm, or significant stenosis. 4. No acute abnormality on the noncontrast CT of head identified. No abnormal enhancement of the brain. 5. Stable advanced chronic microvascular ischemic changes and parenchymal volume loss of the brain. Electronically Signed   By:  Mitzi Hansen M.D.   On: 05/18/2017 21:10   Ct Angio Neck W And/or Wo Contrast  Result Date: 05/18/2017 CLINICAL DATA:  77 y/o  F; right-sided weakness and slurred speech. EXAM: CT ANGIOGRAPHY HEAD AND NECK TECHNIQUE: Multidetector CT imaging of the head and neck was performed using the standard protocol during bolus administration of intravenous contrast. Multiplanar CT image reconstructions and MIPs were obtained to evaluate the vascular anatomy. Carotid stenosis measurements (when applicable) are obtained utilizing NASCET criteria, using the distal internal carotid diameter as the denominator. CONTRAST:  80mL ISOVUE-370 IOPAMIDOL (ISOVUE-370) INJECTION 76% COMPARISON:  11/03/2014 MRI and MRA of the head. 11/03/2014 CT of the head. FINDINGS: CT HEAD FINDINGS Brain: No evidence of acute infarction, hemorrhage, hydrocephalus, extra-axial collection or mass lesion/mass effect. Stable advanced chronic microvascular ischemic changes and parenchymal volume loss of the brain Vascular: As below. Skull: Normal. Negative for fracture or focal lesion. Sinuses: Partial right mastoid opacification.  Otherwise negative. Orbits: No acute finding. Review of the MIP images confirms the above findings CTA NECK FINDINGS Aortic arch: Left vertebral artery arises from the aorta. Imaged portion shows no evidence of aneurysm or dissection. No significant stenosis of the major arch vessel origins. Calcific atherosclerosis of the aortic arch. Right carotid system: No evidence of dissection, stenosis (50% or greater) or occlusion. Mild non stenotic calcific atherosclerosis of the carotid bifurcation. Left carotid system: No evidence of dissection, stenosis (50% or greater) or occlusion. Mild non stenotic calcific atherosclerosis of the carotid bifurcation. Vertebral arteries: Right dominant. No evidence of dissection, stenosis (50% or greater) or occlusion. Skeleton: Mild cervical spondylosis. No high-grade bony canal  stenosis. Other neck: Negative. Upper chest: Negative. Review of the MIP images confirms the above findings CTA HEAD FINDINGS Anterior circulation: No significant stenosis, proximal  occlusion, aneurysm, or vascular malformation. Calcific atherosclerosis of carotid siphons without stenosis. Posterior circulation: Short segment of severe stenosis/near occlusion of the left PICA (series 10, image 244). Stable left distal P1 segment of severe stenosis. Otherwise no significant stenosis, proximal occlusion, aneurysm, or vascular malformation. Venous sinuses: As permitted by contrast timing, patent. Anatomic variants: Small anterior and probable diminutive bilateral posterior communicating arteries. Delayed phase: No abnormal intracranial enhancement. Review of the MIP images confirms the above findings IMPRESSION: 1. Patent carotid and vertebral arteries. No dissection, aneurysm, or hemodynamically significant stenosis utilizing NASCET criteria. 2. Patent anterior and posterior intracranial circulation. 3. 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 Otherwise no large vessel occlusion, aneurysm, or significant stenosis. 4. No acute abnormality on the noncontrast CT of head identified. No abnormal enhancement of the brain. 5. Stable advanced chronic microvascular ischemic changes and parenchymal volume loss of the brain. Electronically Signed   By: Mitzi Hansen M.D.   On: 05/18/2017 21:10   Mr Brain Wo Contrast  Result Date: 05/19/2017 CLINICAL DATA:  Right-sided weakness each abnormality.  TIA. EXAM: MRI HEAD WITHOUT CONTRAST TECHNIQUE: Multiplanar, multiecho pulse sequences of the brain and surrounding structures were obtained without intravenous contrast. COMPARISON:  CT head 05/18/2017 FINDINGS: Brain: Negative for acute infarct. Moderate atrophy. Moderate chronic microvascular ischemic changes in the white matter and pons. Negative for mass or fluid collection. Several  punctate areas of chronic microhemorrhage in the frontal white matter bilaterally. Vascular: Normal arterial flow voids. Skull and upper cervical spine: Negative Sinuses/Orbits: Mild mucosal edema in the paranasal sinuses and right mastoid sinus. Normal orbit. Other: None IMPRESSION: Negative for acute infarct Moderate atrophy and moderate chronic microvascular ischemic change. Electronically Signed   By: Marlan Palau M.D.   On: 05/19/2017 10:19     Discharge Exam: BP (!) 150/62 (BP Location: Right Arm)   Pulse (!) 59   Temp 98.1 F (36.7 C) (Oral)   Resp 15   Ht  (1.499 m)   Wt 52.3 kg (115 lb 6.4 oz)   SpO2 96%   BMI 23.31 kg/m   General: Lying in bed, no apparent distress Eyes: EOMI, anicteric ENT: Oral Mucosa clear and moist Cardiovascular: regular rate and rhythm, no murmurs, rubs or gallops, Peripheral Pulses Present, no edema, no JVD Respiratory: Normal respiratory effort on room air, lungs clear to auscultation bilaterally Abdomen: soft, non-distended, non-tender, normal bowel sounds Skin: No Rash Musculoskeletal:Good ROM, no contractures. Normal muscle tone Neurologic: Grossly no focal neuro deficit.Mental status alert, oriented to person and town, speech normal, Psychiatric:Appropriate affect, and mood   Discharge Instructions You were cared for by a hospitalist during your hospital stay. If you have any questions about your discharge medications or the care you received while you were in the hospital after you are discharged, you can call the unit and asked to speak with the hospitalist on call if the hospitalist that took care of you is not available. Once you are discharged, your primary care physician will handle any further medical issues. Please note that NO REFILLS for any discharge medications will be authorized once you are discharged, as it is imperative that you return to your primary care physician (or establish a relationship with a primary care physician  if you do not have one) for your aftercare needs so that they can reassess your need for medications and monitor your lab values.  Discharge Instructions    Diet - low sodium heart healthy  Complete by:  As directed    Increase activity slowly   Complete by:  As directed      Allergies as of 05/19/2017      Reactions   Aricept [donepezil Hcl]    Doesn't remember   Erythromycin Hives      Medication List    STOP taking these medications   pravastatin 20 MG tablet Commonly known as:  PRAVACHOL     TAKE these medications   ACCU-CHEK SMARTVIEW test strip Generic drug:  glucose blood   amLODipine 10 MG tablet Commonly known as:  NORVASC Take 10 mg by mouth daily. What changed:  Another medication with the same name was removed. Continue taking this medication, and follow the directions you see here.   aspirin EC 81 MG tablet Take 81 mg by mouth daily.   atorvastatin 80 MG tablet Commonly known as:  LIPITOR Take 1 tablet (80 mg total) by mouth daily. Notes to patient:  REPLACES PRAVASTATIN   CALTRATE 600+D 600-400 MG-UNIT tablet Generic drug:  Calcium Carbonate-Vitamin D Take 1 tablet by mouth daily.   clopidogrel 75 MG tablet Commonly known as:  PLAVIX Take 1 tablet (75 mg total) by mouth daily. Start taking on:  05/20/2017   clorazepate 3.75 MG tablet Commonly known as:  TRANXENE Take 3.75 mg by mouth 2 (two) times daily.   denosumab 60 MG/ML Sosy injection Commonly known as:  PROLIA Inject 60 mg into the skin every 6 (six) months.   feeding supplement (ENSURE ENLIVE) Liqd Take 237 mLs by mouth 2 (two) times daily between meals.   Melatonin 10 MG Tabs Take 1 tablet by mouth at bedtime.   memantine 10 MG tablet Commonly known as:  NAMENDA Take 10 mg by mouth 2 (two) times daily.   metFORMIN 500 MG tablet Commonly known as:  GLUCOPHAGE Take 1,000 mg by mouth 2 (two) times daily.   mirtazapine 30 MG tablet Commonly known as:  REMERON Take 30 mg by mouth  at bedtime.   pantoprazole 40 MG tablet Commonly known as:  PROTONIX Take 40 mg by mouth daily.   sodium chloride 1 g tablet Take 1 g by mouth 2 (two) times daily with a meal.      Allergies  Allergen Reactions  . Aricept [Donepezil Hcl]     Doesn't remember  . Erythromycin Hives   Follow-up Information    Micki Riley, MD Follow up in 6 week(s).   Specialties:  Neurology, Radiology Why:  Follow up with Neurology Contact information: [address] [place] [address] [phone]            The results of significant diagnostics from this hospitalization (including imaging, microbiology, ancillary and laboratory) are listed below for reference.    Significant Diagnostic Studies: Ct Angio Head W Or Wo Contrast  Result Date: 05/18/2017 CLINICAL DATA:  77 y/o  F; right-sided weakness and slurred speech. EXAM: CT ANGIOGRAPHY HEAD AND NECK TECHNIQUE: Multidetector CT imaging of the head and neck was performed using the standard protocol during bolus administration of intravenous contrast. Multiplanar CT image reconstructions and MIPs were obtained to evaluate the vascular anatomy. Carotid stenosis measurements (when applicable) are obtained utilizing NASCET criteria, using the distal internal carotid diameter as the denominator. CONTRAST:  80mL ISOVUE-370 IOPAMIDOL (ISOVUE-370) INJECTION 76% COMPARISON:  11/03/2014 MRI and MRA of the head. 11/03/2014 CT of the head. FINDINGS: CT HEAD FINDINGS Brain: No evidence of acute infarction, hemorrhage, hydrocephalus, extra-axial collection or mass lesion/mass  effect. Stable advanced chronic microvascular ischemic changes and parenchymal volume loss of the brain Vascular: As below. Skull: Normal. Negative for fracture or focal lesion. Sinuses: Partial right mastoid opacification.  Otherwise negative. Orbits: No acute finding. Review of the MIP images confirms the above findings CTA NECK FINDINGS Aortic arch: Left vertebral  artery arises from the aorta. Imaged portion shows no evidence of aneurysm or dissection. No significant stenosis of the major arch vessel origins. Calcific atherosclerosis of the aortic arch. Right carotid system: No evidence of dissection, stenosis (50% or greater) or occlusion. Mild non stenotic calcific atherosclerosis of the carotid bifurcation. Left carotid system: No evidence of dissection, stenosis (50% or greater) or occlusion. Mild non stenotic calcific atherosclerosis of the carotid bifurcation. Vertebral arteries: Right dominant. No evidence of dissection, stenosis (50% or greater) or occlusion. Skeleton: Mild cervical spondylosis. No high-grade bony canal stenosis. Other neck: Negative. Upper chest: Negative. Review of the MIP images confirms the above findings CTA HEAD FINDINGS Anterior circulation: No significant stenosis, proximal occlusion, aneurysm, or vascular malformation. Calcific atherosclerosis of carotid siphons without stenosis. Posterior circulation: Short segment of severe stenosis/near occlusion of the left PICA (series 10, image 244). Stable left distal P1 segment of severe stenosis. Otherwise no significant stenosis, proximal occlusion, aneurysm, or vascular malformation. Venous sinuses: As permitted by contrast timing, patent. Anatomic variants: Small anterior and probable diminutive bilateral posterior communicating arteries. Delayed phase: No abnormal intracranial enhancement. Review of the MIP images confirms the above findings IMPRESSION: 1. Patent carotid and vertebral arteries. No dissection, aneurysm, or hemodynamically significant stenosis utilizing NASCET criteria. 2. Patent anterior and posterior intracranial circulation. 3. 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 Otherwise no large vessel occlusion, aneurysm, or significant stenosis. 4. No acute abnormality on the noncontrast CT of head identified. No abnormal enhancement of the brain. 5.  Stable advanced chronic microvascular ischemic changes and parenchymal volume loss of the brain. Electronically Signed   By: Mitzi Hansen M.D.   On: 05/18/2017 21:10   Ct Angio Neck W And/or Wo Contrast  Result Date: 05/18/2017 CLINICAL DATA:  77 y/o  F; right-sided weakness and slurred speech. EXAM: CT ANGIOGRAPHY HEAD AND NECK TECHNIQUE: Multidetector CT imaging of the head and neck was performed using the standard protocol during bolus administration of intravenous contrast. Multiplanar CT image reconstructions and MIPs were obtained to evaluate the vascular anatomy. Carotid stenosis measurements (when applicable) are obtained utilizing NASCET criteria, using the distal internal carotid diameter as the denominator. CONTRAST:  80mL ISOVUE-370 IOPAMIDOL (ISOVUE-370) INJECTION 76% COMPARISON:  11/03/2014 MRI and MRA of the head. 11/03/2014 CT of the head. FINDINGS: CT HEAD FINDINGS Brain: No evidence of acute infarction, hemorrhage, hydrocephalus, extra-axial collection or mass lesion/mass effect. Stable advanced chronic microvascular ischemic changes and parenchymal volume loss of the brain Vascular: As below. Skull: Normal. Negative for fracture or focal lesion. Sinuses: Partial right mastoid opacification.  Otherwise negative. Orbits: No acute finding. Review of the MIP images confirms the above findings CTA NECK FINDINGS Aortic arch: Left vertebral artery arises from the aorta. Imaged portion shows no evidence of aneurysm or dissection. No significant stenosis of the major arch vessel origins. Calcific atherosclerosis of the aortic arch. Right carotid system: No evidence of dissection, stenosis (50% or greater) or occlusion. Mild non stenotic calcific atherosclerosis of the carotid bifurcation. Left carotid system: No evidence of dissection, stenosis (50% or greater) or occlusion. Mild non stenotic calcific atherosclerosis of the carotid bifurcation. Vertebral arteries: Right dominant. No  evidence  of dissection, stenosis (50% or greater) or occlusion. Skeleton: Mild cervical spondylosis. No high-grade bony canal stenosis. Other neck: Negative. Upper chest: Negative. Review of the MIP images confirms the above findings CTA HEAD FINDINGS Anterior circulation: No significant stenosis, proximal occlusion, aneurysm, or vascular malformation. Calcific atherosclerosis of carotid siphons without stenosis. Posterior circulation: Short segment of severe stenosis/near occlusion of the left PICA (series 10, image 244). Stable left distal P1 segment of severe stenosis. Otherwise no significant stenosis, proximal occlusion, aneurysm, or vascular malformation. Venous sinuses: As permitted by contrast timing, patent. Anatomic variants: Small anterior and probable diminutive bilateral posterior communicating arteries. Delayed phase: No abnormal intracranial enhancement. Review of the MIP images confirms the above findings IMPRESSION: 1. Patent carotid and vertebral arteries. No dissection, aneurysm, or hemodynamically significant stenosis utilizing NASCET criteria. 2. Patent anterior and posterior intracranial circulation. 3. 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 Otherwise no large vessel occlusion, aneurysm, or significant stenosis. 4. No acute abnormality on the noncontrast CT of head identified. No abnormal enhancement of the brain. 5. Stable advanced chronic microvascular ischemic changes and parenchymal volume loss of the brain. Electronically Signed   By: Mitzi Hansen M.D.   On: 05/18/2017 21:10   Mr Brain Wo Contrast  Result Date: 05/19/2017 CLINICAL DATA:  Right-sided weakness each abnormality.  TIA. EXAM: MRI HEAD WITHOUT CONTRAST TECHNIQUE: Multiplanar, multiecho pulse sequences of the brain and surrounding structures were obtained without intravenous contrast. COMPARISON:  CT head 05/18/2017 FINDINGS: Brain: Negative for acute infarct. Moderate atrophy. Moderate  chronic microvascular ischemic changes in the white matter and pons. Negative for mass or fluid collection. Several punctate areas of chronic microhemorrhage in the frontal white matter bilaterally. Vascular: Normal arterial flow voids. Skull and upper cervical spine: Negative Sinuses/Orbits: Mild mucosal edema in the paranasal sinuses and right mastoid sinus. Normal orbit. Other: None IMPRESSION: Negative for acute infarct Moderate atrophy and moderate chronic microvascular ischemic change. Electronically Signed   By: Marlan Palau M.D.   On: 05/19/2017 10:19    Microbiology: No results found for this or any previous visit (from the past 240 hour(s)).   Labs: Basic Metabolic Panel: Recent Labs  Lab 05/18/17 2002 05/18/17 2003  NA 138 138  K 4.3 4.2  CL 103 100*  CO2 24  --   GLUCOSE 170* 164*  BUN 22* 22*  CREATININE 1.08* 1.00  CALCIUM 9.3  --    Liver Function Tests: Recent Labs  Lab 05/18/17 2002  AST 17  ALT 11*  ALKPHOS 40  BILITOT 0.6  PROT 7.4  ALBUMIN 4.0   No results for input(s): LIPASE, AMYLASE in the last 168 hours. No results for input(s): AMMONIA in the last 168 hours. CBC: Recent Labs  Lab 05/18/17 2002 05/18/17 2003  WBC 6.5  --   NEUTROABS 3.7  --   HGB 11.4* 11.9*  HCT 34.8* 35.0*  MCV 97.5  --   PLT 285  --    Cardiac Enzymes: No results for input(s): CKTOTAL, CKMB, CKMBINDEX, TROPONINI in the last 168 hours. BNP: BNP (last 3 results) No results for input(s): BNP in the last 8760 hours.  ProBNP (last 3 results) No results for input(s): PROBNP in the last 8760 hours.  CBG: Recent Labs  Lab 05/19/17 0108 05/19/17 0734 05/19/17 1208  GLUCAP 153* 129* 127*       Signed:  Laverna Peace, MD [facility] 05/19/2017, 4:34 PM

## 2017-05-19 NOTE — Evaluation (Signed)
Physical Therapy Evaluation Patient Details Name: Autumn Schneider MRN: [id number] DOB: [birth year] Today's Date: 05/19/2017   History of Present Illness  Pt adm with rt sided weakness that resolved spontaneously. CT and MRI negative. PMH - Alzheimers, HTN, DM, TIA  Clinical Impression  Pt doing well with mobility and no further PT needed.  Ready for dc from PT standpoint.      Follow Up Recommendations No PT follow up    Equipment Recommendations  None recommended by PT    Recommendations for Other Services       Precautions / Restrictions        Mobility  Bed Mobility Overal bed mobility: Independent                Transfers Overall transfer level: Independent Equipment used: None                Ambulation/Gait Ambulation/Gait assistance: Independent Ambulation Distance (Feet): 1000 Feet Assistive device: None Gait Pattern/deviations: WFL(Within Functional Limits)   Gait velocity interpretation: >4.37 ft/sec, indicative of normal walking speed    Stairs            Wheelchair Mobility    Modified Rankin (Stroke Patients Only)       Balance Overall balance assessment: Independent                                           Pertinent Vitals/Pain Pain Assessment: No/denies pain    Home Living Family/patient expects to be discharged to:: Private residence Living Arrangements: Spouse/significant other Available Help at Discharge: Family;Available 24 hours/day Type of Home: House Home Access: Stairs to enter Entrance Stairs-Rails: Right Entrance Stairs-Number of Steps: 3 Home Layout: One level Home Equipment: Shower seat      Prior Function Level of Independence: Needs assistance   Gait / Transfers Assistance Needed: Independent with all mobility           Hand Dominance   Dominant Hand: Right    Extremity/Trunk Assessment   Upper Extremity Assessment Upper Extremity Assessment: Defer to OT evaluation     Lower Extremity Assessment Lower Extremity Assessment: Overall WFL for tasks assessed       Communication   Communication: No difficulties  Cognition Arousal/Alertness: Awake/alert Behavior During Therapy: WFL for tasks assessed/performed Overall Cognitive Status: History of cognitive impairments - at baseline                                        General Comments      Exercises     Assessment/Plan    PT Assessment Patent does not need any further PT services  PT Problem List         PT Treatment Interventions      PT Goals (Current goals can be found in the Care Plan section)  Acute Rehab PT Goals PT Goal Formulation: All assessment and education complete, DC therapy    Frequency     Barriers to discharge        Co-evaluation               AM-PAC PT "6 Clicks" Daily Activity  Outcome Measure Difficulty turning over in bed (including adjusting bedclothes, sheets and blankets)?: None Difficulty moving from lying on back to sitting on the side of the bed? :  None Difficulty sitting down on and standing up from a chair with arms (e.g., wheelchair, bedside commode, etc,.)?: None Help needed moving to and from a bed to chair (including a wheelchair)?: None Help needed walking in hospital room?: None Help needed climbing 3-5 steps with a railing? : None 6 Click Score: 24    End of Session   Activity Tolerance: Patient tolerated treatment well Patient left: in chair;with call bell/phone within reach;with family/visitor present Nurse Communication: Mobility status PT Visit Diagnosis: Other abnormalities of gait and mobility (R26.89)    Time: 1610-9604 PT Time Calculation (min) (ACUTE ONLY): 13 min   Charges:   PT Evaluation $PT Eval Low Complexity: 1 Low     PT G CodesMarland Kitchen        [facility] PT [phone]   Angelina Ok [facility] 05/19/2017, 1:36 PM

## 2017-05-22 ENCOUNTER — Telehealth: Payer: Self-pay | Admitting: Neurology

## 2017-06-01 NOTE — Telephone Encounter (Signed)
Error

## 2017-07-04 ENCOUNTER — Encounter: Payer: Self-pay | Admitting: Adult Health

## 2017-07-04 ENCOUNTER — Ambulatory Visit (INDEPENDENT_AMBULATORY_CARE_PROVIDER_SITE_OTHER): Payer: Medicare Other | Admitting: Adult Health

## 2017-07-04 VITALS — BP 154/85 | HR 75 | Ht 59.0 in | Wt 114.8 lb

## 2017-07-04 DIAGNOSIS — G309 Alzheimer's disease, unspecified: Secondary | ICD-10-CM

## 2017-07-04 DIAGNOSIS — F0280 Dementia in other diseases classified elsewhere without behavioral disturbance: Secondary | ICD-10-CM

## 2017-07-04 DIAGNOSIS — E785 Hyperlipidemia, unspecified: Secondary | ICD-10-CM

## 2017-07-04 DIAGNOSIS — E119 Type 2 diabetes mellitus without complications: Secondary | ICD-10-CM

## 2017-07-04 DIAGNOSIS — I10 Essential (primary) hypertension: Secondary | ICD-10-CM

## 2017-07-04 DIAGNOSIS — G459 Transient cerebral ischemic attack, unspecified: Secondary | ICD-10-CM

## 2017-07-04 NOTE — Progress Notes (Signed)
[facility] [address] [place]. [place] [address] [phone]       OFFICE FOLLOW UP NOTE  Ms. Autumn Schneider Date of Birth:  07/26/1940 Medical Record Number:  [id number]   Reason for Referral:  hospital TIA follow up  CHIEF COMPLAINT:  Chief Complaint  Patient presents with  . New Patient (Initial Visit)    hospital follow up, 05/2017, TIA. pt with husband Windy Fast    HPI: Autumn Schneider is being seen today for initial visit in the office for TIA on 05/18/2017. History obtained from patient, husband and chart review. Reviewed all radiology images and labs personally.  Autumn Schneider is a 77 year old female with medical history significant for Alzheimer's disease, SIADH, DM 2, HTN who presented on 05/18/2017 with worsening weakness and slurred speech follow-up with her husband then again recurred while at home.  Patient was brought into ED for evaluation.  MRI brain reviewed and showed no acute infarct.  CTA head and neck was patent carotid and vertebral arteries but did not severe left PICA stenosis.  2D echo preserved EF with no atrial thrombus.  LDL 128 and recommended to start on atorvastatin.  Patient was on aspirin 81 mg PTA and recommended 3 weeks of aspirin 81 mg and Plavix and then followed by Plavix alone.  Patient was discharged home in stable condition.  Patient is being seen today for hospital follow-up.  Overall she is doing well without recurrent TIA symptoms.  She continues to take aspirin and Plavix without side effects of bleeding or bruising.  Continues to take atorvastatin without side effects myalgias.  Blood pressure elevated at 154/84.  Husband states that they do not check this at home regularly but will start as her blood pressure is consistently at this level during appointments.  Patient does have a history of anxiety and does personally states that she feels anxious frequently patient does have a history of Alzheimer's and he believes that her memory has worsened  since this event.  Quick status exam performed and had AFT 0, unable to do serial additions, recall 0/3, and clock drawing 0/4.  Patient is currently living at home with husband and their son who is 58 years of age.  Denies new or worsening stroke/TIA symptoms.   ROS:   14 system review of systems performed and negative with exception of appetite change, cold intolerance, frequently, runny nose, agitation, behavior problems, confusion, hallucinations, memory loss  PMH:  Past Medical History:  Diagnosis Date  . Alzheimer disease    diagnosed 12/12  . Anxiety and depression   . Dementia   . Diabetes mellitus   . Esophageal reflux   . High cholesterol   . Hypertension   . Hyponatremia   . Migraine   . Osteoporosis   . SIADH (syndrome of inappropriate ADH production) (HCC)   . TIA (transient ischemic attack)   . Vitamin D deficiency     PSH:  Past Surgical History:  Procedure Laterality Date  . CESAREAN SECTION    . SHOULDER SURGERY  2004    Social History:  Social History   Socioeconomic History  . Marital status: Married    Spouse name: Not on file  . Number of children: 2  . Years of education: 12th  . Highest education level: Not on file  Occupational History  . Occupation: Retired  Engineer, production  . Financial resource strain: Not on file  . Food insecurity:    Worry: Not on file  Inability: Not on file  . Transportation needs:    Medical: Not on file    Non-medical: Not on file  Tobacco Use  . Smoking status: Never Smoker  . Smokeless tobacco: Never Used  Substance and Sexual Activity  . Alcohol use: No  . Drug use: No  . Sexual activity: Not on file  Lifestyle  . Physical activity:    Days per week: Not on file    Minutes per session: Not on file  . Stress: Not on file  Relationships  . Social connections:    Talks on phone: Not on file    Gets together: Not on file    Attends religious service: Not on file    Active member of club or  organization: Not on file    Attends meetings of clubs or organizations: Not on file    Relationship status: Not on file  . Intimate partner violence:    Fear of current or ex partner: Not on file    Emotionally abused: Not on file    Physically abused: Not on file    Forced sexual activity: Not on file  Other Topics Concern  . Not on file  Social History Narrative   Pt lives at home with her spouse and son.   Caffeine Use: 2-3 cups of coffee daily.    Family History:  Family History  Problem Relation Age of Onset  . Stroke Mother   . COPD Mother   . Diabetes Sister   . Breast cancer Sister   . Fibromyalgia Sister   . Stroke Maternal Grandmother   . CVA Maternal Grandmother   . Heart attack Maternal Grandfather     Medications:   Current Outpatient Medications on File Prior to Visit  Medication Sig Dispense Refill  . ACCU-CHEK SMARTVIEW test strip     . amLODipine (NORVASC) 10 MG tablet Take 10 mg by mouth daily.    Marland Kitchen. aspirin EC 81 MG tablet Take 81 mg by mouth daily.    Marland Kitchen. atorvastatin (LIPITOR) 80 MG tablet Take 1 tablet (80 mg total) by mouth daily. 30 tablet 11  . Calcium Carbonate-Vitamin D (CALTRATE 600+D) 600-400 MG-UNIT per tablet Take 1 tablet by mouth daily.    . clopidogrel (PLAVIX) 75 MG tablet Take 1 tablet (75 mg total) by mouth daily. 60 tablet 0  . clorazepate (TRANXENE) 3.75 MG tablet Take 3.75 mg by mouth 2 (two) times daily.     Marland Kitchen. denosumab (PROLIA) 60 MG/ML SOSY injection Inject 60 mg into the skin every 6 (six) months.    . feeding supplement, ENSURE ENLIVE, (ENSURE ENLIVE) LIQD Take 237 mLs by mouth 2 (two) times daily between meals. 237 mL 12  . Melatonin 10 MG TABS Take 1 tablet by mouth at bedtime.    . memantine (NAMENDA) 10 MG tablet Take 10 mg by mouth 2 (two) times daily.    . metFORMIN (GLUCOPHAGE) 500 MG tablet Take 1,000 mg by mouth 2 (two) times daily.     . mirtazapine (REMERON) 30 MG tablet Take 30 mg by mouth at bedtime.  0  .  pantoprazole (PROTONIX) 40 MG tablet Take 40 mg by mouth daily.    . sodium chloride 1 G tablet Take 1 g by mouth 2 (two) times daily with a meal.      No current facility-administered medications on file prior to visit.     Allergies:   Allergies  Allergen Reactions  . Aricept [Donepezil Hcl]  Doesn't remember  . Erythromycin Hives     Physical Exam  Vitals:   07/04/17 1528  BP: (!) 154/85  Pulse: 75  Weight: 114 lb 12.8 oz (52.1 kg)  Height: 4\' 11"  (1.499 m)   Body mass index is 23.19 kg/m. No exam data present  General: Frail pleasant elderly Caucasian female, seated, in no evident distress Head: head normocephalic and atraumatic.   Neck: supple with no carotid or supraclavicular bruits Cardiovascular: regular rate and rhythm, no murmurs Musculoskeletal: no deformity Skin:  no rash/petichiae Vascular:  Normal pulses all extremities  Neurologic Exam Mental Status: Awake and fully alert.  Disoriented to place and time. attention span, concentration and fund of knowledge appropriate.  AFT 0, clock drawing 0/4, recall 0/3 and unable to do serial additions MMSE - Mini Mental State Exam 02/05/2015 02/10/2014  Orientation to time 0 2  Orientation to Place 3 5  Registration 3 3  Attention/ Calculation 0 1  Recall 0 0  Language- name 2 objects 2 2  Language- repeat 1 1  Language- follow 3 step command 1 2  Language- read & follow direction 1 1  Write a sentence 1 1  Copy design 0 0  Total score 12 18   Cranial Nerves: Fundoscopic exam reveals sharp disc margins. Pupils equal, briskly reactive to light. Extraocular movements full without nystagmus. Visual fields full to confrontation. Hearing intact. Facial sensation intact. Face, tongue, palate moves normally and symmetrically.  Motor: Normal bulk and tone. Normal strength in all tested extremity muscles. Sensory.: intact to touch , pinprick , position and vibratory sensation.  Coordination: Rapid alternating  movements normal in all extremities. Finger-to-nose and heel-to-shin performed accurately bilaterally. Gait and Station: Arises from chair without difficulty. Stance is normal. Gait demonstrates normal stride length and balance . Able to heel, toe and tandem walk without difficulty.  Reflexes: 1+ and symmetric. Toes downgoing.    NIHSS  1 Modified Rankin  2   Diagnostic Data (Labs, Imaging, Testing)  CT ANGIO HEAD W OR WO CONTRAST CT ANGIO NECK W OR WO CONTRAST 05/18/2017 IMPRESSION: 1. Patent carotid and vertebral arteries. No dissection, aneurysm, or hemodynamically significant stenosis utilizing NASCET criteria. 2. Patent anterior and posterior intracranial circulation. 3. 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 Otherwise no large vessel occlusion, aneurysm, or significant stenosis. 4. No acute abnormality on the noncontrast CT of head identified. No abnormal enhancement of the brain. 5. Stable advanced chronic microvascular ischemic changes and parenchymal volume loss of the brain.  MR BRAIN WO CONTRAST 05/19/17 IMPRESSION: Negative for acute infarct  ECHOCARDIOGRAM 05/19/2017 Study Conclusions - Left ventricle: The cavity size was normal. Wall thickness was   increased in a pattern of moderate LVH. Systolic function was   normal. The estimated ejection fraction was in the range of 60%   to 65%. Wall motion was normal; there were no regional wall   motion abnormalities. Left ventricular diastolic function   parameters were normal. - Mitral valve: Calcified annulus. - Atrial septum: No defect or patent foramen ovale was identified.   ASSESSMENT: Autumn Schneider is a 77 y.o. year old female here with TIA on 05/18/2017. Vascular risk factors include HTN, HLD and DM.     PLAN: -Continue clopidogrel 75 mg daily  and atorvastatin for secondary stroke prevention -Stop aspirin 325 as 3 weeks of DAPT completed -F/u with PCP regarding your HLD, HTN  and DM management -Consider MMSE at follow-up visit -continue to monitor BP  at home -Maintain strict control of hypertension with blood pressure goal below 130/90, diabetes with hemoglobin A1c goal below 6.5% and cholesterol with LDL cholesterol (bad cholesterol) goal below 70 mg/dL. I also advised the patient to eat a healthy diet with plenty of whole grains, cereals, fruits and vegetables, exercise regularly and maintain ideal body weight.  Follow up in 3 months or call earlier if needed   Greater than 50% of time during this 25 minute visit was spent on counseling,explanation of diagnosis of TIA, reviewing risk factor management of HTN, HLD and DM, planning of further management, discussion with patient and family and coordination of care   George Hugh, [facility]  [facility] [address] [place], [place] [address]  Phone [phone] Fax [phone]

## 2017-07-04 NOTE — Patient Instructions (Addendum)
Continue clopidogrel 75 mg daily  and lipitor  for secondary stroke prevention  Stop aspirin 325mg  as 3 weeks of 2 different blood thinners have been completed  We will do a formal memory exam at follow up appointment - MMSE  Continue to follow up with PCP regarding cholesterol, diabetes and blood pressure management   Continue to monitor blood pressure at home  Maintain strict control of hypertension with blood pressure goal below 130/90, diabetes with hemoglobin A1c goal below 6.5% and cholesterol with LDL cholesterol (bad cholesterol) goal below 70 mg/dL. I also advised the patient to eat a healthy diet with plenty of whole grains, cereals, fruits and vegetables, exercise regularly and maintain ideal body weight.  Followup in the future with me in 3 months or call earlier if needed       [facility]. I hope we have been able to provide you high quality care today.  You may receive a patient satisfaction survey over the next few weeks. We would appreciate your feedback and comments so that we may continue to improve ourselves and the health of our patients.

## 2017-07-05 NOTE — Progress Notes (Signed)
I agree with the above plan 

## 2017-07-11 ENCOUNTER — Other Ambulatory Visit: Payer: Self-pay

## 2017-07-11 ENCOUNTER — Telehealth: Payer: Self-pay | Admitting: Adult Health

## 2017-07-11 MED ORDER — CLOPIDOGREL BISULFATE 75 MG PO TABS: 75.0000 mg | ORAL_TABLET | Freq: Every day | ORAL | 0 refills | Status: DC

## 2017-07-11 NOTE — Telephone Encounter (Signed)
Rn receive call from [place] at Dr. Katrinka BlazingSmith office. Rn ask when was pt last office visit. Autumn Schneider stated pt was seen 04/2017 for a routine blood pressure, and maintence check up.Autumn Schneider stated the husband call Dr. Katrinka BlazingSmith to get refills but it got decline because it was started by our neurologist at the hospital. DR. Katrinka BlazingSmith wants our office to do refills. Pt has not been seen since 4/2019by her PCP, and had stroke in May 2019. Rn stated that once our patients are stable from stroke they are release back to PCP for all the meds we prescribed. Autumn Schneider stated if the note says pt is release, any medication we prescribed can be refill by Dr.Smith. Refill done per Autumn BumpsJessica NP.

## 2017-07-11 NOTE — Telephone Encounter (Signed)
Pt's husband request refill for clopidogrel (PLAVIX) 75 MG tablet sent to [facility]/[address]. Pt has contacted PCP and been advised she won't prescribe this medication.

## 2017-07-11 NOTE — Telephone Encounter (Addendum)
Left contact for PCP CMA or RN  to call back.

## 2017-07-11 NOTE — Telephone Encounter (Signed)
Refill sent for 90 days.  

## 2017-07-11 NOTE — Telephone Encounter (Signed)
Left vm for ALexis  assistant for Dr. Harlow AsaSmithat [phone] to call back when available.

## 2017-07-11 NOTE — Telephone Encounter (Signed)
Revised. 

## 2017-07-11 NOTE — Telephone Encounter (Signed)
Alexis@ Dr Michaelle CopasSmith's office has returned the call to [employer] Autumn Schneider, she is asking for a call back at [phone]

## 2017-07-18 ENCOUNTER — Ambulatory Visit: Payer: Medicare Other | Admitting: Neurology

## 2017-09-22 ENCOUNTER — Telehealth: Payer: Self-pay

## 2017-09-22 NOTE — Telephone Encounter (Signed)
Received referral for Palliative Care. VM left to schedule visit with Palliative Care NP.

## 2017-09-25 ENCOUNTER — Telehealth: Payer: Self-pay

## 2017-09-25 NOTE — Telephone Encounter (Signed)
Spoke with patient's husband, Windy Fast, to schedule visit with Palliative Care. Visit scheduled for Thursday 09/28/17.

## 2017-09-25 NOTE — Telephone Encounter (Signed)
Message left for Autumn Schneider at PCP office to make aware that visit is scheduled.

## 2017-09-27 ENCOUNTER — Telehealth: Payer: Self-pay

## 2017-09-27 NOTE — Telephone Encounter (Signed)
Received phone call from patient's husband who requested to keep appointment as originally scheduled for 09/28/17

## 2017-09-27 NOTE — Telephone Encounter (Signed)
Received VM from patient's husband to notify Palliative care that he will need to reschedule visit. Phone call returned to patient's husband who requested to communicate early next week to schedule a visit.

## 2017-09-28 ENCOUNTER — Encounter: Payer: Self-pay | Admitting: Internal Medicine

## 2017-09-28 ENCOUNTER — Other Ambulatory Visit: Payer: Medicare Other | Admitting: Internal Medicine

## 2017-09-28 DIAGNOSIS — I6782 Cerebral ischemia: Secondary | ICD-10-CM

## 2017-09-28 DIAGNOSIS — R634 Abnormal weight loss: Secondary | ICD-10-CM

## 2017-09-28 DIAGNOSIS — G309 Alzheimer's disease, unspecified: Principal | ICD-10-CM

## 2017-09-28 DIAGNOSIS — F0280 Dementia in other diseases classified elsewhere without behavioral disturbance: Secondary | ICD-10-CM

## 2017-09-28 NOTE — Progress Notes (Addendum)
PALLIATIVE CARE CONSULT VISIT   PATIENT NAME: Autumn Schneider DOB: 12/01/1940 MRN: [id number]  PRIMARY CARE PROVIDER:   Merri Brunette, MD  REFERRING PROVIDER:  Merri Brunette, MD [phone] Autumn Schneider [address] [place], [place] [address]  IMPRESSION/RECOMMENDATIONS: 1. Dementia: Schneider 6e. Accelerating progression over these past 2 months with weight loss, incontinence of bowel and bladder, increased confusion, memory loss, and apathy. She is dependent for hygiene and dressing. She needs assist to initiate feeding.  She is alert and oriented to self, and sometimes to place. She usually recognizes her family members but has been increasingly getting them mixed up. She is very confused and is unable to follow a conversation. Her speech can deteriorate to non-sensical babbling, or echolalia. She is unable to follow 2 step commands (go into the LV and get some tissues). She can ambulate about independently. She demonstrates poor safety awareness (will leave hot water on). She can be resistive to personal care.              a. Discussed educational resources. We reviewed the Alzheimer  Disease web site, and I e-mailed supplemental information to patient's sister Autumn Schneider upon her request. We discussed investigating local support groups, and the many ways participation can result in emotional support as well as networking for other ideas and resources. We discussed progressive nature of disease and what symptoms patient may manifest as she declines.             b. Spouse Autumn Schneider is providing majority of her care. He recently suffered a pinched nerve which is compromising his mobility. Family hired a Chief of Staff (Autumn Schneider)1d/week for bath. Son Autumn Schneider lives in the home and helps as he is able.   2 .Ischemic Cerebrovascular disease. Brief hospitalization 05/18/17 for TIA manifest as R sided weakness and slurred speech. Symptoms resolved. Plavix and atorvastatin initiated.  3. Weight loss, unintentional. Likely r/t  dementia. Current weight 108lbs, reflecting a loss of 6.8lbs (6% of body weight) over the last 7 weeks. Family report declining appetite, currently averages 50% of light meals. She now needs at least initial guiding assist, prompting, and encouragement to eat. Her albumin in April was 4.3.  4. Advanced Care Directives: DNR in place on fridge. I completed another copy for them to carry with them when traveling out of the home. We reviewed and completed a MOST form. DNR/DNI, Limited scope of medical care, use of antibiotics and IVFs to be determined at time of use. No tube feedings. Written educational material regarding DNR/MOST form left in the home.  5. Goals of Care: We spent a bit of time discussing hospice services and eligibility criteria. Family would like to pursue if she is eligible.  I will follow up with our medical director. She may qualify under diagnosis of protein calorie malnutrition.              a. Will mail hard copy of HCPOA to home per husband's request.  6. Follow up:             a.NP visit 1-2 months; monitor for continued functional decline/hospice eligibility. Ongoing family education and support. I spent 105 minutes providing this consultation (from 11am to 12:45pm). More than 50% of that time was spent coordinating communication.  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 She has been demonstrating  progressive functional decline with unintentional weight loss.  Now referred to Palliative Care for assistance with symptom management, help with coordination of resources, and ongoing discussions regarding goals of care/advanced directives  CODE STATUS: DNR. MOST form completed today. PPS: 40%.Marland Kitchen.  HOSPICE ELIGIBILITY: To be determined.  PAST MEDICAL HISTORY:  Past Medical History:  Diagnosis Date  . Alzheimer disease    diagnosed 12/12   . Anxiety and depression   . Dementia   . Diabetes mellitus   . Esophageal reflux   . High cholesterol   . Hypertension   . Hyponatremia   . Migraine   . Osteoporosis   . SIADH (syndrome of inappropriate ADH production) (HCC)   . TIA (transient ischemic attack)   . Vitamin D deficiency     SOCIAL HX:  Social History   Tobacco Use  . Smoking status: Never Smoker  . Smokeless tobacco: Never Used  Substance Use Topics  . Alcohol use: No    ALLERGIES:  Allergies  Allergen Reactions  . Aricept [Donepezil Hcl]     Doesn't remember  . Erythromycin Hives     PERTINENT MEDICATIONS:  Outpatient Encounter Medications as of 09/28/2017  Medication Sig  . ACCU-CHEK SMARTVIEW test strip   . amLODipine (NORVASC) 10 MG tablet Take 10 mg by mouth daily.  Marland Kitchen. aspirin EC 81 MG tablet Take 81 mg by mouth daily.  Marland Kitchen. atorvastatin (LIPITOR) 80 MG tablet Take 1 tablet (80 mg total) by mouth daily.  . Calcium Carbonate-Vitamin D (CALTRATE 600+D) 600-400 MG-UNIT per tablet Take 1 tablet by mouth daily.  . clopidogrel (PLAVIX) 75 MG tablet Take 1 tablet (75 mg total) by mouth daily.  . clorazepate (TRANXENE) 3.75 MG tablet Take 3.75 mg by mouth 2 (two) times daily.   Marland Kitchen. denosumab (PROLIA) 60 MG/ML SOSY injection Inject 60 mg into the skin every 6 (six) months.  . feeding supplement, ENSURE ENLIVE, (ENSURE ENLIVE) LIQD Take 237 mLs by mouth 2 (two) times daily between meals.  . Melatonin 10 MG TABS Take 1 tablet by mouth at bedtime.  . memantine (NAMENDA) 10 MG tablet Take 10 mg by mouth 2 (two) times daily.  . metFORMIN (GLUCOPHAGE) 500 MG tablet Take 1,000 mg by mouth 2 (two) times daily.   . mirtazapine (REMERON) 30 MG tablet Take 30 mg by mouth at bedtime.  . pantoprazole (PROTONIX) 40 MG tablet Take 40 mg by mouth daily.  . sodium chloride 1 G tablet Take 1 g by mouth 2 (two) times daily with a meal.    No facility-administered encounter medications on file as of 09/28/2017.     PHYSICAL  EXAM:  Pleasant elderly female sitting at dining room table. She is pleasant but confused. Able to answer some simple questions and follow one step commands. Tendency towards echolalia.  General: NAD, frail appearing, thin Cardiovascular: regular rate and rhythm Pulmonary: clear ant fields Abdomen: soft, nontender, + bowel sounds GU: no suprapubic tenderness Extremities: no edema, no joint deformities Skin: no rashes Neurological: Weakness but otherwise nonfocal  Anselm LisMary P Jette Lewan, NP

## 2017-10-04 ENCOUNTER — Ambulatory Visit: Payer: Medicare Other | Admitting: Adult Health

## 2017-10-18 ENCOUNTER — Telehealth: Payer: Self-pay | Admitting: Internal Medicine

## 2017-10-18 ENCOUNTER — Telehealth: Payer: Self-pay

## 2017-10-18 NOTE — Telephone Encounter (Signed)
Received a message to call husband. Husband reported that her weight at the MD office yesterday was 106.2 pounds. Meal intake continues to decline, approximate intake is 50% of small meals Provided contact information to husband. Will continue to check in with patient's husband.

## 2017-10-18 NOTE — Telephone Encounter (Signed)
Return TC to Patient's spouse, who requested I call him at 5:15 today.  Autumn Schneider reports that patient's weight in physician's office yesterday (10/17/2017) was 106.2lbs. This reflects a further weight loss of 1.8lbs over 3 weeks, and total of 8.6lbs (7.5% of body weight) over last 10 weeks. Height of 5', BMI 20.7kg/m2. He reports that patient continues with a poor appetite; consuming just 50% of 2 small meals/day, with an evening snack. Autumn Schneider wanders about the home holding her baby doll and babbles to herself. Autumn Schneider is constantly confused. Autumn Schneider is hopeful that his wife will soon be hospice eligible.  We will continue to follow her weights.

## 2017-10-19 ENCOUNTER — Telehealth: Payer: Self-pay | Admitting: Internal Medicine

## 2017-10-19 NOTE — Telephone Encounter (Signed)
12:20pm:  Updated Patient's PCG husband Windy Fast, that based on continued weight loss and functional declin, it was the opinion of our Hospice physician (and CMO)  Dr. Elliot Gurney that patient was hospice eligible. I called and left a message with patient's attending Dr. Merri Brunette, asking for hospice referral  if she were in agreement. Also asked if Dr. Katrinka Blazing would continue to serve as patient's attending when patient transferred to Hospice services, and permission to activate standing hospice care prn order set.   Holly Bodily NP-C

## 2017-10-21 DIAGNOSIS — F339 Major depressive disorder, recurrent, unspecified: Secondary | ICD-10-CM | POA: Diagnosis not present

## 2017-10-21 DIAGNOSIS — E46 Unspecified protein-calorie malnutrition: Secondary | ICD-10-CM | POA: Diagnosis not present

## 2017-10-21 DIAGNOSIS — E1159 Type 2 diabetes mellitus with other circulatory complications: Secondary | ICD-10-CM | POA: Diagnosis not present

## 2017-10-21 DIAGNOSIS — K219 Gastro-esophageal reflux disease without esophagitis: Secondary | ICD-10-CM | POA: Diagnosis not present

## 2017-10-21 DIAGNOSIS — I10 Essential (primary) hypertension: Secondary | ICD-10-CM | POA: Diagnosis not present

## 2017-10-21 DIAGNOSIS — M810 Age-related osteoporosis without current pathological fracture: Secondary | ICD-10-CM | POA: Diagnosis not present

## 2017-10-21 DIAGNOSIS — G309 Alzheimer's disease, unspecified: Secondary | ICD-10-CM | POA: Diagnosis not present

## 2017-10-21 DIAGNOSIS — I679 Cerebrovascular disease, unspecified: Secondary | ICD-10-CM | POA: Diagnosis not present

## 2017-10-21 DIAGNOSIS — E785 Hyperlipidemia, unspecified: Secondary | ICD-10-CM | POA: Diagnosis not present

## 2017-10-23 DIAGNOSIS — G309 Alzheimer's disease, unspecified: Secondary | ICD-10-CM | POA: Diagnosis not present

## 2017-10-23 DIAGNOSIS — F339 Major depressive disorder, recurrent, unspecified: Secondary | ICD-10-CM | POA: Diagnosis not present

## 2017-10-23 DIAGNOSIS — E46 Unspecified protein-calorie malnutrition: Secondary | ICD-10-CM | POA: Diagnosis not present

## 2017-10-23 DIAGNOSIS — I679 Cerebrovascular disease, unspecified: Secondary | ICD-10-CM | POA: Diagnosis not present

## 2017-10-23 DIAGNOSIS — E1159 Type 2 diabetes mellitus with other circulatory complications: Secondary | ICD-10-CM | POA: Diagnosis not present

## 2017-10-23 DIAGNOSIS — I10 Essential (primary) hypertension: Secondary | ICD-10-CM | POA: Diagnosis not present

## 2017-10-24 DIAGNOSIS — F339 Major depressive disorder, recurrent, unspecified: Secondary | ICD-10-CM | POA: Diagnosis not present

## 2017-10-24 DIAGNOSIS — G309 Alzheimer's disease, unspecified: Secondary | ICD-10-CM | POA: Diagnosis not present

## 2017-10-24 DIAGNOSIS — I10 Essential (primary) hypertension: Secondary | ICD-10-CM | POA: Diagnosis not present

## 2017-10-24 DIAGNOSIS — E46 Unspecified protein-calorie malnutrition: Secondary | ICD-10-CM | POA: Diagnosis not present

## 2017-10-24 DIAGNOSIS — I679 Cerebrovascular disease, unspecified: Secondary | ICD-10-CM | POA: Diagnosis not present

## 2017-10-24 DIAGNOSIS — E1159 Type 2 diabetes mellitus with other circulatory complications: Secondary | ICD-10-CM | POA: Diagnosis not present

## 2017-10-27 DIAGNOSIS — G309 Alzheimer's disease, unspecified: Secondary | ICD-10-CM | POA: Diagnosis not present

## 2017-10-27 DIAGNOSIS — E46 Unspecified protein-calorie malnutrition: Secondary | ICD-10-CM | POA: Diagnosis not present

## 2017-10-27 DIAGNOSIS — E1159 Type 2 diabetes mellitus with other circulatory complications: Secondary | ICD-10-CM | POA: Diagnosis not present

## 2017-10-27 DIAGNOSIS — I679 Cerebrovascular disease, unspecified: Secondary | ICD-10-CM | POA: Diagnosis not present

## 2017-10-27 DIAGNOSIS — I10 Essential (primary) hypertension: Secondary | ICD-10-CM | POA: Diagnosis not present

## 2017-10-27 DIAGNOSIS — F339 Major depressive disorder, recurrent, unspecified: Secondary | ICD-10-CM | POA: Diagnosis not present

## 2017-10-30 DIAGNOSIS — I10 Essential (primary) hypertension: Secondary | ICD-10-CM | POA: Diagnosis not present

## 2017-10-30 DIAGNOSIS — F339 Major depressive disorder, recurrent, unspecified: Secondary | ICD-10-CM | POA: Diagnosis not present

## 2017-10-30 DIAGNOSIS — E46 Unspecified protein-calorie malnutrition: Secondary | ICD-10-CM | POA: Diagnosis not present

## 2017-10-30 DIAGNOSIS — I679 Cerebrovascular disease, unspecified: Secondary | ICD-10-CM | POA: Diagnosis not present

## 2017-10-30 DIAGNOSIS — G309 Alzheimer's disease, unspecified: Secondary | ICD-10-CM | POA: Diagnosis not present

## 2017-10-30 DIAGNOSIS — E1159 Type 2 diabetes mellitus with other circulatory complications: Secondary | ICD-10-CM | POA: Diagnosis not present

## 2017-10-31 DIAGNOSIS — G309 Alzheimer's disease, unspecified: Secondary | ICD-10-CM | POA: Diagnosis not present

## 2017-10-31 DIAGNOSIS — I10 Essential (primary) hypertension: Secondary | ICD-10-CM | POA: Diagnosis not present

## 2017-10-31 DIAGNOSIS — I679 Cerebrovascular disease, unspecified: Secondary | ICD-10-CM | POA: Diagnosis not present

## 2017-10-31 DIAGNOSIS — E46 Unspecified protein-calorie malnutrition: Secondary | ICD-10-CM | POA: Diagnosis not present

## 2017-10-31 DIAGNOSIS — E1159 Type 2 diabetes mellitus with other circulatory complications: Secondary | ICD-10-CM | POA: Diagnosis not present

## 2017-10-31 DIAGNOSIS — F339 Major depressive disorder, recurrent, unspecified: Secondary | ICD-10-CM | POA: Diagnosis not present

## 2017-11-01 DIAGNOSIS — E46 Unspecified protein-calorie malnutrition: Secondary | ICD-10-CM | POA: Diagnosis not present

## 2017-11-01 DIAGNOSIS — M810 Age-related osteoporosis without current pathological fracture: Secondary | ICD-10-CM | POA: Diagnosis not present

## 2017-11-01 DIAGNOSIS — E1159 Type 2 diabetes mellitus with other circulatory complications: Secondary | ICD-10-CM | POA: Diagnosis not present

## 2017-11-01 DIAGNOSIS — I679 Cerebrovascular disease, unspecified: Secondary | ICD-10-CM | POA: Diagnosis not present

## 2017-11-01 DIAGNOSIS — E222 Syndrome of inappropriate secretion of antidiuretic hormone: Secondary | ICD-10-CM | POA: Diagnosis not present

## 2017-11-01 DIAGNOSIS — F0390 Unspecified dementia without behavioral disturbance: Secondary | ICD-10-CM | POA: Diagnosis not present

## 2017-11-01 DIAGNOSIS — G309 Alzheimer's disease, unspecified: Secondary | ICD-10-CM | POA: Diagnosis not present

## 2017-11-01 DIAGNOSIS — F339 Major depressive disorder, recurrent, unspecified: Secondary | ICD-10-CM | POA: Diagnosis not present

## 2017-11-01 DIAGNOSIS — I10 Essential (primary) hypertension: Secondary | ICD-10-CM | POA: Diagnosis not present

## 2017-11-01 DIAGNOSIS — N390 Urinary tract infection, site not specified: Secondary | ICD-10-CM | POA: Diagnosis not present

## 2017-11-02 DIAGNOSIS — G309 Alzheimer's disease, unspecified: Secondary | ICD-10-CM | POA: Diagnosis not present

## 2017-11-02 DIAGNOSIS — E1159 Type 2 diabetes mellitus with other circulatory complications: Secondary | ICD-10-CM | POA: Diagnosis not present

## 2017-11-02 DIAGNOSIS — F339 Major depressive disorder, recurrent, unspecified: Secondary | ICD-10-CM | POA: Diagnosis not present

## 2017-11-02 DIAGNOSIS — I10 Essential (primary) hypertension: Secondary | ICD-10-CM | POA: Diagnosis not present

## 2017-11-02 DIAGNOSIS — E46 Unspecified protein-calorie malnutrition: Secondary | ICD-10-CM | POA: Diagnosis not present

## 2017-11-02 DIAGNOSIS — I679 Cerebrovascular disease, unspecified: Secondary | ICD-10-CM | POA: Diagnosis not present

## 2017-11-03 DIAGNOSIS — G309 Alzheimer's disease, unspecified: Secondary | ICD-10-CM | POA: Diagnosis not present

## 2017-11-03 DIAGNOSIS — I10 Essential (primary) hypertension: Secondary | ICD-10-CM | POA: Diagnosis not present

## 2017-11-03 DIAGNOSIS — E46 Unspecified protein-calorie malnutrition: Secondary | ICD-10-CM | POA: Diagnosis not present

## 2017-11-03 DIAGNOSIS — E1159 Type 2 diabetes mellitus with other circulatory complications: Secondary | ICD-10-CM | POA: Diagnosis not present

## 2017-11-03 DIAGNOSIS — F339 Major depressive disorder, recurrent, unspecified: Secondary | ICD-10-CM | POA: Diagnosis not present

## 2017-11-03 DIAGNOSIS — I679 Cerebrovascular disease, unspecified: Secondary | ICD-10-CM | POA: Diagnosis not present

## 2017-11-07 DIAGNOSIS — G309 Alzheimer's disease, unspecified: Secondary | ICD-10-CM | POA: Diagnosis not present

## 2017-11-07 DIAGNOSIS — I679 Cerebrovascular disease, unspecified: Secondary | ICD-10-CM | POA: Diagnosis not present

## 2017-11-07 DIAGNOSIS — I10 Essential (primary) hypertension: Secondary | ICD-10-CM | POA: Diagnosis not present

## 2017-11-07 DIAGNOSIS — E46 Unspecified protein-calorie malnutrition: Secondary | ICD-10-CM | POA: Diagnosis not present

## 2017-11-07 DIAGNOSIS — F339 Major depressive disorder, recurrent, unspecified: Secondary | ICD-10-CM | POA: Diagnosis not present

## 2017-11-07 DIAGNOSIS — E1159 Type 2 diabetes mellitus with other circulatory complications: Secondary | ICD-10-CM | POA: Diagnosis not present

## 2017-11-10 DIAGNOSIS — G309 Alzheimer's disease, unspecified: Secondary | ICD-10-CM | POA: Diagnosis not present

## 2017-11-10 DIAGNOSIS — I10 Essential (primary) hypertension: Secondary | ICD-10-CM | POA: Diagnosis not present

## 2017-11-10 DIAGNOSIS — E1159 Type 2 diabetes mellitus with other circulatory complications: Secondary | ICD-10-CM | POA: Diagnosis not present

## 2017-11-10 DIAGNOSIS — I679 Cerebrovascular disease, unspecified: Secondary | ICD-10-CM | POA: Diagnosis not present

## 2017-11-10 DIAGNOSIS — F339 Major depressive disorder, recurrent, unspecified: Secondary | ICD-10-CM | POA: Diagnosis not present

## 2017-11-10 DIAGNOSIS — E46 Unspecified protein-calorie malnutrition: Secondary | ICD-10-CM | POA: Diagnosis not present

## 2017-11-14 DIAGNOSIS — F339 Major depressive disorder, recurrent, unspecified: Secondary | ICD-10-CM | POA: Diagnosis not present

## 2017-11-14 DIAGNOSIS — E46 Unspecified protein-calorie malnutrition: Secondary | ICD-10-CM | POA: Diagnosis not present

## 2017-11-14 DIAGNOSIS — I679 Cerebrovascular disease, unspecified: Secondary | ICD-10-CM | POA: Diagnosis not present

## 2017-11-14 DIAGNOSIS — G309 Alzheimer's disease, unspecified: Secondary | ICD-10-CM | POA: Diagnosis not present

## 2017-11-14 DIAGNOSIS — I10 Essential (primary) hypertension: Secondary | ICD-10-CM | POA: Diagnosis not present

## 2017-11-14 DIAGNOSIS — E1159 Type 2 diabetes mellitus with other circulatory complications: Secondary | ICD-10-CM | POA: Diagnosis not present

## 2017-11-15 DIAGNOSIS — E46 Unspecified protein-calorie malnutrition: Secondary | ICD-10-CM | POA: Diagnosis not present

## 2017-11-15 DIAGNOSIS — I679 Cerebrovascular disease, unspecified: Secondary | ICD-10-CM | POA: Diagnosis not present

## 2017-11-15 DIAGNOSIS — G309 Alzheimer's disease, unspecified: Secondary | ICD-10-CM | POA: Diagnosis not present

## 2017-11-15 DIAGNOSIS — E1159 Type 2 diabetes mellitus with other circulatory complications: Secondary | ICD-10-CM | POA: Diagnosis not present

## 2017-11-15 DIAGNOSIS — I10 Essential (primary) hypertension: Secondary | ICD-10-CM | POA: Diagnosis not present

## 2017-11-15 DIAGNOSIS — F339 Major depressive disorder, recurrent, unspecified: Secondary | ICD-10-CM | POA: Diagnosis not present

## 2017-11-17 DIAGNOSIS — I10 Essential (primary) hypertension: Secondary | ICD-10-CM | POA: Diagnosis not present

## 2017-11-17 DIAGNOSIS — K219 Gastro-esophageal reflux disease without esophagitis: Secondary | ICD-10-CM | POA: Diagnosis not present

## 2017-11-17 DIAGNOSIS — G309 Alzheimer's disease, unspecified: Secondary | ICD-10-CM | POA: Diagnosis not present

## 2017-11-17 DIAGNOSIS — E785 Hyperlipidemia, unspecified: Secondary | ICD-10-CM | POA: Diagnosis not present

## 2017-11-17 DIAGNOSIS — E46 Unspecified protein-calorie malnutrition: Secondary | ICD-10-CM | POA: Diagnosis not present

## 2017-11-17 DIAGNOSIS — F339 Major depressive disorder, recurrent, unspecified: Secondary | ICD-10-CM | POA: Diagnosis not present

## 2017-11-17 DIAGNOSIS — E1159 Type 2 diabetes mellitus with other circulatory complications: Secondary | ICD-10-CM | POA: Diagnosis not present

## 2017-11-17 DIAGNOSIS — I679 Cerebrovascular disease, unspecified: Secondary | ICD-10-CM | POA: Diagnosis not present

## 2017-11-17 DIAGNOSIS — M810 Age-related osteoporosis without current pathological fracture: Secondary | ICD-10-CM | POA: Diagnosis not present

## 2017-11-21 DIAGNOSIS — I679 Cerebrovascular disease, unspecified: Secondary | ICD-10-CM | POA: Diagnosis not present

## 2017-11-21 DIAGNOSIS — I10 Essential (primary) hypertension: Secondary | ICD-10-CM | POA: Diagnosis not present

## 2017-11-21 DIAGNOSIS — G309 Alzheimer's disease, unspecified: Secondary | ICD-10-CM | POA: Diagnosis not present

## 2017-11-21 DIAGNOSIS — E46 Unspecified protein-calorie malnutrition: Secondary | ICD-10-CM | POA: Diagnosis not present

## 2017-11-21 DIAGNOSIS — F339 Major depressive disorder, recurrent, unspecified: Secondary | ICD-10-CM | POA: Diagnosis not present

## 2017-11-21 DIAGNOSIS — E1159 Type 2 diabetes mellitus with other circulatory complications: Secondary | ICD-10-CM | POA: Diagnosis not present

## 2017-11-24 DIAGNOSIS — G309 Alzheimer's disease, unspecified: Secondary | ICD-10-CM | POA: Diagnosis not present

## 2017-11-24 DIAGNOSIS — E46 Unspecified protein-calorie malnutrition: Secondary | ICD-10-CM | POA: Diagnosis not present

## 2017-11-24 DIAGNOSIS — I10 Essential (primary) hypertension: Secondary | ICD-10-CM | POA: Diagnosis not present

## 2017-11-24 DIAGNOSIS — I679 Cerebrovascular disease, unspecified: Secondary | ICD-10-CM | POA: Diagnosis not present

## 2017-11-24 DIAGNOSIS — E1159 Type 2 diabetes mellitus with other circulatory complications: Secondary | ICD-10-CM | POA: Diagnosis not present

## 2017-11-24 DIAGNOSIS — F339 Major depressive disorder, recurrent, unspecified: Secondary | ICD-10-CM | POA: Diagnosis not present

## 2017-11-27 DIAGNOSIS — I679 Cerebrovascular disease, unspecified: Secondary | ICD-10-CM | POA: Diagnosis not present

## 2017-11-27 DIAGNOSIS — F339 Major depressive disorder, recurrent, unspecified: Secondary | ICD-10-CM | POA: Diagnosis not present

## 2017-11-27 DIAGNOSIS — G309 Alzheimer's disease, unspecified: Secondary | ICD-10-CM | POA: Diagnosis not present

## 2017-11-27 DIAGNOSIS — E1159 Type 2 diabetes mellitus with other circulatory complications: Secondary | ICD-10-CM | POA: Diagnosis not present

## 2017-11-27 DIAGNOSIS — I10 Essential (primary) hypertension: Secondary | ICD-10-CM | POA: Diagnosis not present

## 2017-11-27 DIAGNOSIS — E46 Unspecified protein-calorie malnutrition: Secondary | ICD-10-CM | POA: Diagnosis not present

## 2017-11-28 DIAGNOSIS — G309 Alzheimer's disease, unspecified: Secondary | ICD-10-CM | POA: Diagnosis not present

## 2017-11-28 DIAGNOSIS — I679 Cerebrovascular disease, unspecified: Secondary | ICD-10-CM | POA: Diagnosis not present

## 2017-11-28 DIAGNOSIS — E1159 Type 2 diabetes mellitus with other circulatory complications: Secondary | ICD-10-CM | POA: Diagnosis not present

## 2017-11-28 DIAGNOSIS — E46 Unspecified protein-calorie malnutrition: Secondary | ICD-10-CM | POA: Diagnosis not present

## 2017-11-28 DIAGNOSIS — I10 Essential (primary) hypertension: Secondary | ICD-10-CM | POA: Diagnosis not present

## 2017-11-28 DIAGNOSIS — F339 Major depressive disorder, recurrent, unspecified: Secondary | ICD-10-CM | POA: Diagnosis not present

## 2017-11-29 DIAGNOSIS — I10 Essential (primary) hypertension: Secondary | ICD-10-CM | POA: Diagnosis not present

## 2017-11-29 DIAGNOSIS — G309 Alzheimer's disease, unspecified: Secondary | ICD-10-CM | POA: Diagnosis not present

## 2017-11-29 DIAGNOSIS — F339 Major depressive disorder, recurrent, unspecified: Secondary | ICD-10-CM | POA: Diagnosis not present

## 2017-11-29 DIAGNOSIS — I679 Cerebrovascular disease, unspecified: Secondary | ICD-10-CM | POA: Diagnosis not present

## 2017-11-29 DIAGNOSIS — E46 Unspecified protein-calorie malnutrition: Secondary | ICD-10-CM | POA: Diagnosis not present

## 2017-11-29 DIAGNOSIS — E1159 Type 2 diabetes mellitus with other circulatory complications: Secondary | ICD-10-CM | POA: Diagnosis not present

## 2017-12-01 DIAGNOSIS — I679 Cerebrovascular disease, unspecified: Secondary | ICD-10-CM | POA: Diagnosis not present

## 2017-12-01 DIAGNOSIS — E46 Unspecified protein-calorie malnutrition: Secondary | ICD-10-CM | POA: Diagnosis not present

## 2017-12-01 DIAGNOSIS — E1159 Type 2 diabetes mellitus with other circulatory complications: Secondary | ICD-10-CM | POA: Diagnosis not present

## 2017-12-01 DIAGNOSIS — G309 Alzheimer's disease, unspecified: Secondary | ICD-10-CM | POA: Diagnosis not present

## 2017-12-01 DIAGNOSIS — F339 Major depressive disorder, recurrent, unspecified: Secondary | ICD-10-CM | POA: Diagnosis not present

## 2017-12-01 DIAGNOSIS — I10 Essential (primary) hypertension: Secondary | ICD-10-CM | POA: Diagnosis not present

## 2017-12-04 DIAGNOSIS — I10 Essential (primary) hypertension: Secondary | ICD-10-CM | POA: Diagnosis not present

## 2017-12-04 DIAGNOSIS — E1159 Type 2 diabetes mellitus with other circulatory complications: Secondary | ICD-10-CM | POA: Diagnosis not present

## 2017-12-04 DIAGNOSIS — E46 Unspecified protein-calorie malnutrition: Secondary | ICD-10-CM | POA: Diagnosis not present

## 2017-12-04 DIAGNOSIS — I679 Cerebrovascular disease, unspecified: Secondary | ICD-10-CM | POA: Diagnosis not present

## 2017-12-04 DIAGNOSIS — G309 Alzheimer's disease, unspecified: Secondary | ICD-10-CM | POA: Diagnosis not present

## 2017-12-04 DIAGNOSIS — F339 Major depressive disorder, recurrent, unspecified: Secondary | ICD-10-CM | POA: Diagnosis not present

## 2017-12-05 DIAGNOSIS — E46 Unspecified protein-calorie malnutrition: Secondary | ICD-10-CM | POA: Diagnosis not present

## 2017-12-05 DIAGNOSIS — I679 Cerebrovascular disease, unspecified: Secondary | ICD-10-CM | POA: Diagnosis not present

## 2017-12-05 DIAGNOSIS — F339 Major depressive disorder, recurrent, unspecified: Secondary | ICD-10-CM | POA: Diagnosis not present

## 2017-12-05 DIAGNOSIS — I10 Essential (primary) hypertension: Secondary | ICD-10-CM | POA: Diagnosis not present

## 2017-12-05 DIAGNOSIS — E1159 Type 2 diabetes mellitus with other circulatory complications: Secondary | ICD-10-CM | POA: Diagnosis not present

## 2017-12-05 DIAGNOSIS — G309 Alzheimer's disease, unspecified: Secondary | ICD-10-CM | POA: Diagnosis not present

## 2017-12-06 DIAGNOSIS — E46 Unspecified protein-calorie malnutrition: Secondary | ICD-10-CM | POA: Diagnosis not present

## 2017-12-06 DIAGNOSIS — I679 Cerebrovascular disease, unspecified: Secondary | ICD-10-CM | POA: Diagnosis not present

## 2017-12-06 DIAGNOSIS — E1159 Type 2 diabetes mellitus with other circulatory complications: Secondary | ICD-10-CM | POA: Diagnosis not present

## 2017-12-06 DIAGNOSIS — G309 Alzheimer's disease, unspecified: Secondary | ICD-10-CM | POA: Diagnosis not present

## 2017-12-06 DIAGNOSIS — F339 Major depressive disorder, recurrent, unspecified: Secondary | ICD-10-CM | POA: Diagnosis not present

## 2017-12-06 DIAGNOSIS — I10 Essential (primary) hypertension: Secondary | ICD-10-CM | POA: Diagnosis not present

## 2017-12-08 DIAGNOSIS — E46 Unspecified protein-calorie malnutrition: Secondary | ICD-10-CM | POA: Diagnosis not present

## 2017-12-08 DIAGNOSIS — I10 Essential (primary) hypertension: Secondary | ICD-10-CM | POA: Diagnosis not present

## 2017-12-08 DIAGNOSIS — I679 Cerebrovascular disease, unspecified: Secondary | ICD-10-CM | POA: Diagnosis not present

## 2017-12-08 DIAGNOSIS — F339 Major depressive disorder, recurrent, unspecified: Secondary | ICD-10-CM | POA: Diagnosis not present

## 2017-12-08 DIAGNOSIS — G309 Alzheimer's disease, unspecified: Secondary | ICD-10-CM | POA: Diagnosis not present

## 2017-12-08 DIAGNOSIS — E1159 Type 2 diabetes mellitus with other circulatory complications: Secondary | ICD-10-CM | POA: Diagnosis not present

## 2017-12-11 DIAGNOSIS — I10 Essential (primary) hypertension: Secondary | ICD-10-CM | POA: Diagnosis not present

## 2017-12-11 DIAGNOSIS — G309 Alzheimer's disease, unspecified: Secondary | ICD-10-CM | POA: Diagnosis not present

## 2017-12-11 DIAGNOSIS — E1159 Type 2 diabetes mellitus with other circulatory complications: Secondary | ICD-10-CM | POA: Diagnosis not present

## 2017-12-11 DIAGNOSIS — F339 Major depressive disorder, recurrent, unspecified: Secondary | ICD-10-CM | POA: Diagnosis not present

## 2017-12-11 DIAGNOSIS — I679 Cerebrovascular disease, unspecified: Secondary | ICD-10-CM | POA: Diagnosis not present

## 2017-12-11 DIAGNOSIS — E46 Unspecified protein-calorie malnutrition: Secondary | ICD-10-CM | POA: Diagnosis not present

## 2017-12-13 DIAGNOSIS — E46 Unspecified protein-calorie malnutrition: Secondary | ICD-10-CM | POA: Diagnosis not present

## 2017-12-13 DIAGNOSIS — E1159 Type 2 diabetes mellitus with other circulatory complications: Secondary | ICD-10-CM | POA: Diagnosis not present

## 2017-12-13 DIAGNOSIS — I679 Cerebrovascular disease, unspecified: Secondary | ICD-10-CM | POA: Diagnosis not present

## 2017-12-13 DIAGNOSIS — I10 Essential (primary) hypertension: Secondary | ICD-10-CM | POA: Diagnosis not present

## 2017-12-13 DIAGNOSIS — G309 Alzheimer's disease, unspecified: Secondary | ICD-10-CM | POA: Diagnosis not present

## 2017-12-13 DIAGNOSIS — F339 Major depressive disorder, recurrent, unspecified: Secondary | ICD-10-CM | POA: Diagnosis not present

## 2017-12-15 DIAGNOSIS — I10 Essential (primary) hypertension: Secondary | ICD-10-CM | POA: Diagnosis not present

## 2017-12-15 DIAGNOSIS — E1159 Type 2 diabetes mellitus with other circulatory complications: Secondary | ICD-10-CM | POA: Diagnosis not present

## 2017-12-15 DIAGNOSIS — F339 Major depressive disorder, recurrent, unspecified: Secondary | ICD-10-CM | POA: Diagnosis not present

## 2017-12-15 DIAGNOSIS — I679 Cerebrovascular disease, unspecified: Secondary | ICD-10-CM | POA: Diagnosis not present

## 2017-12-15 DIAGNOSIS — E46 Unspecified protein-calorie malnutrition: Secondary | ICD-10-CM | POA: Diagnosis not present

## 2017-12-15 DIAGNOSIS — G309 Alzheimer's disease, unspecified: Secondary | ICD-10-CM | POA: Diagnosis not present

## 2017-12-17 DIAGNOSIS — G309 Alzheimer's disease, unspecified: Secondary | ICD-10-CM | POA: Diagnosis not present

## 2017-12-17 DIAGNOSIS — I10 Essential (primary) hypertension: Secondary | ICD-10-CM | POA: Diagnosis not present

## 2017-12-17 DIAGNOSIS — F339 Major depressive disorder, recurrent, unspecified: Secondary | ICD-10-CM | POA: Diagnosis not present

## 2017-12-17 DIAGNOSIS — E785 Hyperlipidemia, unspecified: Secondary | ICD-10-CM | POA: Diagnosis not present

## 2017-12-17 DIAGNOSIS — I679 Cerebrovascular disease, unspecified: Secondary | ICD-10-CM | POA: Diagnosis not present

## 2017-12-17 DIAGNOSIS — E1159 Type 2 diabetes mellitus with other circulatory complications: Secondary | ICD-10-CM | POA: Diagnosis not present

## 2017-12-17 DIAGNOSIS — M810 Age-related osteoporosis without current pathological fracture: Secondary | ICD-10-CM | POA: Diagnosis not present

## 2017-12-17 DIAGNOSIS — E46 Unspecified protein-calorie malnutrition: Secondary | ICD-10-CM | POA: Diagnosis not present

## 2017-12-17 DIAGNOSIS — K219 Gastro-esophageal reflux disease without esophagitis: Secondary | ICD-10-CM | POA: Diagnosis not present

## 2017-12-18 DIAGNOSIS — I679 Cerebrovascular disease, unspecified: Secondary | ICD-10-CM | POA: Diagnosis not present

## 2017-12-18 DIAGNOSIS — G309 Alzheimer's disease, unspecified: Secondary | ICD-10-CM | POA: Diagnosis not present

## 2017-12-18 DIAGNOSIS — I10 Essential (primary) hypertension: Secondary | ICD-10-CM | POA: Diagnosis not present

## 2017-12-18 DIAGNOSIS — F339 Major depressive disorder, recurrent, unspecified: Secondary | ICD-10-CM | POA: Diagnosis not present

## 2017-12-18 DIAGNOSIS — E1159 Type 2 diabetes mellitus with other circulatory complications: Secondary | ICD-10-CM | POA: Diagnosis not present

## 2017-12-18 DIAGNOSIS — E46 Unspecified protein-calorie malnutrition: Secondary | ICD-10-CM | POA: Diagnosis not present

## 2017-12-20 DIAGNOSIS — E1159 Type 2 diabetes mellitus with other circulatory complications: Secondary | ICD-10-CM | POA: Diagnosis not present

## 2017-12-20 DIAGNOSIS — I10 Essential (primary) hypertension: Secondary | ICD-10-CM | POA: Diagnosis not present

## 2017-12-20 DIAGNOSIS — F339 Major depressive disorder, recurrent, unspecified: Secondary | ICD-10-CM | POA: Diagnosis not present

## 2017-12-20 DIAGNOSIS — G309 Alzheimer's disease, unspecified: Secondary | ICD-10-CM | POA: Diagnosis not present

## 2017-12-20 DIAGNOSIS — E46 Unspecified protein-calorie malnutrition: Secondary | ICD-10-CM | POA: Diagnosis not present

## 2017-12-20 DIAGNOSIS — I679 Cerebrovascular disease, unspecified: Secondary | ICD-10-CM | POA: Diagnosis not present

## 2017-12-22 DIAGNOSIS — I10 Essential (primary) hypertension: Secondary | ICD-10-CM | POA: Diagnosis not present

## 2017-12-22 DIAGNOSIS — F339 Major depressive disorder, recurrent, unspecified: Secondary | ICD-10-CM | POA: Diagnosis not present

## 2017-12-22 DIAGNOSIS — G309 Alzheimer's disease, unspecified: Secondary | ICD-10-CM | POA: Diagnosis not present

## 2017-12-22 DIAGNOSIS — I679 Cerebrovascular disease, unspecified: Secondary | ICD-10-CM | POA: Diagnosis not present

## 2017-12-22 DIAGNOSIS — E46 Unspecified protein-calorie malnutrition: Secondary | ICD-10-CM | POA: Diagnosis not present

## 2017-12-22 DIAGNOSIS — E1159 Type 2 diabetes mellitus with other circulatory complications: Secondary | ICD-10-CM | POA: Diagnosis not present

## 2017-12-25 DIAGNOSIS — I679 Cerebrovascular disease, unspecified: Secondary | ICD-10-CM | POA: Diagnosis not present

## 2017-12-25 DIAGNOSIS — F339 Major depressive disorder, recurrent, unspecified: Secondary | ICD-10-CM | POA: Diagnosis not present

## 2017-12-25 DIAGNOSIS — E46 Unspecified protein-calorie malnutrition: Secondary | ICD-10-CM | POA: Diagnosis not present

## 2017-12-25 DIAGNOSIS — I10 Essential (primary) hypertension: Secondary | ICD-10-CM | POA: Diagnosis not present

## 2017-12-25 DIAGNOSIS — G309 Alzheimer's disease, unspecified: Secondary | ICD-10-CM | POA: Diagnosis not present

## 2017-12-25 DIAGNOSIS — E1159 Type 2 diabetes mellitus with other circulatory complications: Secondary | ICD-10-CM | POA: Diagnosis not present

## 2017-12-27 DIAGNOSIS — G309 Alzheimer's disease, unspecified: Secondary | ICD-10-CM | POA: Diagnosis not present

## 2017-12-27 DIAGNOSIS — I10 Essential (primary) hypertension: Secondary | ICD-10-CM | POA: Diagnosis not present

## 2017-12-27 DIAGNOSIS — I679 Cerebrovascular disease, unspecified: Secondary | ICD-10-CM | POA: Diagnosis not present

## 2017-12-27 DIAGNOSIS — E1159 Type 2 diabetes mellitus with other circulatory complications: Secondary | ICD-10-CM | POA: Diagnosis not present

## 2017-12-27 DIAGNOSIS — F339 Major depressive disorder, recurrent, unspecified: Secondary | ICD-10-CM | POA: Diagnosis not present

## 2017-12-27 DIAGNOSIS — E46 Unspecified protein-calorie malnutrition: Secondary | ICD-10-CM | POA: Diagnosis not present

## 2017-12-28 DIAGNOSIS — E46 Unspecified protein-calorie malnutrition: Secondary | ICD-10-CM | POA: Diagnosis not present

## 2017-12-28 DIAGNOSIS — G309 Alzheimer's disease, unspecified: Secondary | ICD-10-CM | POA: Diagnosis not present

## 2017-12-28 DIAGNOSIS — I679 Cerebrovascular disease, unspecified: Secondary | ICD-10-CM | POA: Diagnosis not present

## 2017-12-28 DIAGNOSIS — I10 Essential (primary) hypertension: Secondary | ICD-10-CM | POA: Diagnosis not present

## 2017-12-28 DIAGNOSIS — E1159 Type 2 diabetes mellitus with other circulatory complications: Secondary | ICD-10-CM | POA: Diagnosis not present

## 2017-12-28 DIAGNOSIS — F339 Major depressive disorder, recurrent, unspecified: Secondary | ICD-10-CM | POA: Diagnosis not present

## 2017-12-29 DIAGNOSIS — F339 Major depressive disorder, recurrent, unspecified: Secondary | ICD-10-CM | POA: Diagnosis not present

## 2017-12-29 DIAGNOSIS — E1159 Type 2 diabetes mellitus with other circulatory complications: Secondary | ICD-10-CM | POA: Diagnosis not present

## 2017-12-29 DIAGNOSIS — I679 Cerebrovascular disease, unspecified: Secondary | ICD-10-CM | POA: Diagnosis not present

## 2017-12-29 DIAGNOSIS — E46 Unspecified protein-calorie malnutrition: Secondary | ICD-10-CM | POA: Diagnosis not present

## 2017-12-29 DIAGNOSIS — G309 Alzheimer's disease, unspecified: Secondary | ICD-10-CM | POA: Diagnosis not present

## 2017-12-29 DIAGNOSIS — I10 Essential (primary) hypertension: Secondary | ICD-10-CM | POA: Diagnosis not present

## 2018-01-01 DIAGNOSIS — F339 Major depressive disorder, recurrent, unspecified: Secondary | ICD-10-CM | POA: Diagnosis not present

## 2018-01-01 DIAGNOSIS — E1159 Type 2 diabetes mellitus with other circulatory complications: Secondary | ICD-10-CM | POA: Diagnosis not present

## 2018-01-01 DIAGNOSIS — G309 Alzheimer's disease, unspecified: Secondary | ICD-10-CM | POA: Diagnosis not present

## 2018-01-01 DIAGNOSIS — I679 Cerebrovascular disease, unspecified: Secondary | ICD-10-CM | POA: Diagnosis not present

## 2018-01-01 DIAGNOSIS — E46 Unspecified protein-calorie malnutrition: Secondary | ICD-10-CM | POA: Diagnosis not present

## 2018-01-01 DIAGNOSIS — I10 Essential (primary) hypertension: Secondary | ICD-10-CM | POA: Diagnosis not present

## 2018-01-02 DIAGNOSIS — E1159 Type 2 diabetes mellitus with other circulatory complications: Secondary | ICD-10-CM | POA: Diagnosis not present

## 2018-01-02 DIAGNOSIS — I10 Essential (primary) hypertension: Secondary | ICD-10-CM | POA: Diagnosis not present

## 2018-01-02 DIAGNOSIS — I679 Cerebrovascular disease, unspecified: Secondary | ICD-10-CM | POA: Diagnosis not present

## 2018-01-02 DIAGNOSIS — E46 Unspecified protein-calorie malnutrition: Secondary | ICD-10-CM | POA: Diagnosis not present

## 2018-01-02 DIAGNOSIS — F339 Major depressive disorder, recurrent, unspecified: Secondary | ICD-10-CM | POA: Diagnosis not present

## 2018-01-02 DIAGNOSIS — G309 Alzheimer's disease, unspecified: Secondary | ICD-10-CM | POA: Diagnosis not present

## 2018-01-03 DIAGNOSIS — E1159 Type 2 diabetes mellitus with other circulatory complications: Secondary | ICD-10-CM | POA: Diagnosis not present

## 2018-01-03 DIAGNOSIS — E46 Unspecified protein-calorie malnutrition: Secondary | ICD-10-CM | POA: Diagnosis not present

## 2018-01-03 DIAGNOSIS — F339 Major depressive disorder, recurrent, unspecified: Secondary | ICD-10-CM | POA: Diagnosis not present

## 2018-01-03 DIAGNOSIS — G309 Alzheimer's disease, unspecified: Secondary | ICD-10-CM | POA: Diagnosis not present

## 2018-01-03 DIAGNOSIS — I10 Essential (primary) hypertension: Secondary | ICD-10-CM | POA: Diagnosis not present

## 2018-01-03 DIAGNOSIS — I679 Cerebrovascular disease, unspecified: Secondary | ICD-10-CM | POA: Diagnosis not present

## 2018-01-05 DIAGNOSIS — F339 Major depressive disorder, recurrent, unspecified: Secondary | ICD-10-CM | POA: Diagnosis not present

## 2018-01-05 DIAGNOSIS — I10 Essential (primary) hypertension: Secondary | ICD-10-CM | POA: Diagnosis not present

## 2018-01-05 DIAGNOSIS — E46 Unspecified protein-calorie malnutrition: Secondary | ICD-10-CM | POA: Diagnosis not present

## 2018-01-05 DIAGNOSIS — E1159 Type 2 diabetes mellitus with other circulatory complications: Secondary | ICD-10-CM | POA: Diagnosis not present

## 2018-01-05 DIAGNOSIS — G309 Alzheimer's disease, unspecified: Secondary | ICD-10-CM | POA: Diagnosis not present

## 2018-01-05 DIAGNOSIS — I679 Cerebrovascular disease, unspecified: Secondary | ICD-10-CM | POA: Diagnosis not present

## 2018-01-08 DIAGNOSIS — I10 Essential (primary) hypertension: Secondary | ICD-10-CM | POA: Diagnosis not present

## 2018-01-08 DIAGNOSIS — F339 Major depressive disorder, recurrent, unspecified: Secondary | ICD-10-CM | POA: Diagnosis not present

## 2018-01-08 DIAGNOSIS — G309 Alzheimer's disease, unspecified: Secondary | ICD-10-CM | POA: Diagnosis not present

## 2018-01-08 DIAGNOSIS — E46 Unspecified protein-calorie malnutrition: Secondary | ICD-10-CM | POA: Diagnosis not present

## 2018-01-08 DIAGNOSIS — E1159 Type 2 diabetes mellitus with other circulatory complications: Secondary | ICD-10-CM | POA: Diagnosis not present

## 2018-01-08 DIAGNOSIS — I679 Cerebrovascular disease, unspecified: Secondary | ICD-10-CM | POA: Diagnosis not present

## 2018-01-11 DIAGNOSIS — E46 Unspecified protein-calorie malnutrition: Secondary | ICD-10-CM | POA: Diagnosis not present

## 2018-01-11 DIAGNOSIS — E1159 Type 2 diabetes mellitus with other circulatory complications: Secondary | ICD-10-CM | POA: Diagnosis not present

## 2018-01-11 DIAGNOSIS — G309 Alzheimer's disease, unspecified: Secondary | ICD-10-CM | POA: Diagnosis not present

## 2018-01-11 DIAGNOSIS — I10 Essential (primary) hypertension: Secondary | ICD-10-CM | POA: Diagnosis not present

## 2018-01-11 DIAGNOSIS — I679 Cerebrovascular disease, unspecified: Secondary | ICD-10-CM | POA: Diagnosis not present

## 2018-01-11 DIAGNOSIS — F339 Major depressive disorder, recurrent, unspecified: Secondary | ICD-10-CM | POA: Diagnosis not present

## 2018-01-12 DIAGNOSIS — G309 Alzheimer's disease, unspecified: Secondary | ICD-10-CM | POA: Diagnosis not present

## 2018-01-12 DIAGNOSIS — I10 Essential (primary) hypertension: Secondary | ICD-10-CM | POA: Diagnosis not present

## 2018-01-12 DIAGNOSIS — F339 Major depressive disorder, recurrent, unspecified: Secondary | ICD-10-CM | POA: Diagnosis not present

## 2018-01-12 DIAGNOSIS — I679 Cerebrovascular disease, unspecified: Secondary | ICD-10-CM | POA: Diagnosis not present

## 2018-01-12 DIAGNOSIS — E1159 Type 2 diabetes mellitus with other circulatory complications: Secondary | ICD-10-CM | POA: Diagnosis not present

## 2018-01-12 DIAGNOSIS — E46 Unspecified protein-calorie malnutrition: Secondary | ICD-10-CM | POA: Diagnosis not present

## 2018-01-15 DIAGNOSIS — F339 Major depressive disorder, recurrent, unspecified: Secondary | ICD-10-CM | POA: Diagnosis not present

## 2018-01-15 DIAGNOSIS — E1159 Type 2 diabetes mellitus with other circulatory complications: Secondary | ICD-10-CM | POA: Diagnosis not present

## 2018-01-15 DIAGNOSIS — I10 Essential (primary) hypertension: Secondary | ICD-10-CM | POA: Diagnosis not present

## 2018-01-15 DIAGNOSIS — G309 Alzheimer's disease, unspecified: Secondary | ICD-10-CM | POA: Diagnosis not present

## 2018-01-15 DIAGNOSIS — I679 Cerebrovascular disease, unspecified: Secondary | ICD-10-CM | POA: Diagnosis not present

## 2018-01-15 DIAGNOSIS — E46 Unspecified protein-calorie malnutrition: Secondary | ICD-10-CM | POA: Diagnosis not present

## 2018-01-17 DIAGNOSIS — E1159 Type 2 diabetes mellitus with other circulatory complications: Secondary | ICD-10-CM | POA: Diagnosis not present

## 2018-01-17 DIAGNOSIS — F339 Major depressive disorder, recurrent, unspecified: Secondary | ICD-10-CM | POA: Diagnosis not present

## 2018-01-17 DIAGNOSIS — E46 Unspecified protein-calorie malnutrition: Secondary | ICD-10-CM | POA: Diagnosis not present

## 2018-01-17 DIAGNOSIS — I10 Essential (primary) hypertension: Secondary | ICD-10-CM | POA: Diagnosis not present

## 2018-01-17 DIAGNOSIS — I679 Cerebrovascular disease, unspecified: Secondary | ICD-10-CM | POA: Diagnosis not present

## 2018-01-17 DIAGNOSIS — K219 Gastro-esophageal reflux disease without esophagitis: Secondary | ICD-10-CM | POA: Diagnosis not present

## 2018-01-17 DIAGNOSIS — M810 Age-related osteoporosis without current pathological fracture: Secondary | ICD-10-CM | POA: Diagnosis not present

## 2018-01-17 DIAGNOSIS — E785 Hyperlipidemia, unspecified: Secondary | ICD-10-CM | POA: Diagnosis not present

## 2018-01-17 DIAGNOSIS — G309 Alzheimer's disease, unspecified: Secondary | ICD-10-CM | POA: Diagnosis not present

## 2018-01-19 DIAGNOSIS — E1159 Type 2 diabetes mellitus with other circulatory complications: Secondary | ICD-10-CM | POA: Diagnosis not present

## 2018-01-19 DIAGNOSIS — F339 Major depressive disorder, recurrent, unspecified: Secondary | ICD-10-CM | POA: Diagnosis not present

## 2018-01-19 DIAGNOSIS — I679 Cerebrovascular disease, unspecified: Secondary | ICD-10-CM | POA: Diagnosis not present

## 2018-01-19 DIAGNOSIS — E46 Unspecified protein-calorie malnutrition: Secondary | ICD-10-CM | POA: Diagnosis not present

## 2018-01-19 DIAGNOSIS — G309 Alzheimer's disease, unspecified: Secondary | ICD-10-CM | POA: Diagnosis not present

## 2018-01-19 DIAGNOSIS — I10 Essential (primary) hypertension: Secondary | ICD-10-CM | POA: Diagnosis not present

## 2018-01-22 DIAGNOSIS — G309 Alzheimer's disease, unspecified: Secondary | ICD-10-CM | POA: Diagnosis not present

## 2018-01-22 DIAGNOSIS — I679 Cerebrovascular disease, unspecified: Secondary | ICD-10-CM | POA: Diagnosis not present

## 2018-01-22 DIAGNOSIS — F339 Major depressive disorder, recurrent, unspecified: Secondary | ICD-10-CM | POA: Diagnosis not present

## 2018-01-22 DIAGNOSIS — I10 Essential (primary) hypertension: Secondary | ICD-10-CM | POA: Diagnosis not present

## 2018-01-22 DIAGNOSIS — E1159 Type 2 diabetes mellitus with other circulatory complications: Secondary | ICD-10-CM | POA: Diagnosis not present

## 2018-01-22 DIAGNOSIS — E46 Unspecified protein-calorie malnutrition: Secondary | ICD-10-CM | POA: Diagnosis not present

## 2018-01-24 DIAGNOSIS — I10 Essential (primary) hypertension: Secondary | ICD-10-CM | POA: Diagnosis not present

## 2018-01-24 DIAGNOSIS — I679 Cerebrovascular disease, unspecified: Secondary | ICD-10-CM | POA: Diagnosis not present

## 2018-01-24 DIAGNOSIS — F339 Major depressive disorder, recurrent, unspecified: Secondary | ICD-10-CM | POA: Diagnosis not present

## 2018-01-24 DIAGNOSIS — E46 Unspecified protein-calorie malnutrition: Secondary | ICD-10-CM | POA: Diagnosis not present

## 2018-01-24 DIAGNOSIS — G309 Alzheimer's disease, unspecified: Secondary | ICD-10-CM | POA: Diagnosis not present

## 2018-01-24 DIAGNOSIS — E1159 Type 2 diabetes mellitus with other circulatory complications: Secondary | ICD-10-CM | POA: Diagnosis not present

## 2018-01-26 DIAGNOSIS — E1159 Type 2 diabetes mellitus with other circulatory complications: Secondary | ICD-10-CM | POA: Diagnosis not present

## 2018-01-26 DIAGNOSIS — I679 Cerebrovascular disease, unspecified: Secondary | ICD-10-CM | POA: Diagnosis not present

## 2018-01-26 DIAGNOSIS — F339 Major depressive disorder, recurrent, unspecified: Secondary | ICD-10-CM | POA: Diagnosis not present

## 2018-01-26 DIAGNOSIS — E46 Unspecified protein-calorie malnutrition: Secondary | ICD-10-CM | POA: Diagnosis not present

## 2018-01-26 DIAGNOSIS — G309 Alzheimer's disease, unspecified: Secondary | ICD-10-CM | POA: Diagnosis not present

## 2018-01-26 DIAGNOSIS — I10 Essential (primary) hypertension: Secondary | ICD-10-CM | POA: Diagnosis not present

## 2018-01-29 DIAGNOSIS — E1159 Type 2 diabetes mellitus with other circulatory complications: Secondary | ICD-10-CM | POA: Diagnosis not present

## 2018-01-29 DIAGNOSIS — I679 Cerebrovascular disease, unspecified: Secondary | ICD-10-CM | POA: Diagnosis not present

## 2018-01-29 DIAGNOSIS — E46 Unspecified protein-calorie malnutrition: Secondary | ICD-10-CM | POA: Diagnosis not present

## 2018-01-29 DIAGNOSIS — F339 Major depressive disorder, recurrent, unspecified: Secondary | ICD-10-CM | POA: Diagnosis not present

## 2018-01-29 DIAGNOSIS — G309 Alzheimer's disease, unspecified: Secondary | ICD-10-CM | POA: Diagnosis not present

## 2018-01-29 DIAGNOSIS — I10 Essential (primary) hypertension: Secondary | ICD-10-CM | POA: Diagnosis not present

## 2018-01-31 DIAGNOSIS — I10 Essential (primary) hypertension: Secondary | ICD-10-CM | POA: Diagnosis not present

## 2018-01-31 DIAGNOSIS — F339 Major depressive disorder, recurrent, unspecified: Secondary | ICD-10-CM | POA: Diagnosis not present

## 2018-01-31 DIAGNOSIS — I679 Cerebrovascular disease, unspecified: Secondary | ICD-10-CM | POA: Diagnosis not present

## 2018-01-31 DIAGNOSIS — E1159 Type 2 diabetes mellitus with other circulatory complications: Secondary | ICD-10-CM | POA: Diagnosis not present

## 2018-01-31 DIAGNOSIS — G309 Alzheimer's disease, unspecified: Secondary | ICD-10-CM | POA: Diagnosis not present

## 2018-01-31 DIAGNOSIS — E46 Unspecified protein-calorie malnutrition: Secondary | ICD-10-CM | POA: Diagnosis not present

## 2018-02-01 DIAGNOSIS — G309 Alzheimer's disease, unspecified: Secondary | ICD-10-CM | POA: Diagnosis not present

## 2018-02-01 DIAGNOSIS — S60221A Contusion of right hand, initial encounter: Secondary | ICD-10-CM | POA: Diagnosis not present

## 2018-02-01 DIAGNOSIS — E46 Unspecified protein-calorie malnutrition: Secondary | ICD-10-CM | POA: Diagnosis not present

## 2018-02-01 DIAGNOSIS — I679 Cerebrovascular disease, unspecified: Secondary | ICD-10-CM | POA: Diagnosis not present

## 2018-02-01 DIAGNOSIS — F339 Major depressive disorder, recurrent, unspecified: Secondary | ICD-10-CM | POA: Diagnosis not present

## 2018-02-01 DIAGNOSIS — I10 Essential (primary) hypertension: Secondary | ICD-10-CM | POA: Diagnosis not present

## 2018-02-01 DIAGNOSIS — E1159 Type 2 diabetes mellitus with other circulatory complications: Secondary | ICD-10-CM | POA: Diagnosis not present

## 2018-02-02 DIAGNOSIS — I679 Cerebrovascular disease, unspecified: Secondary | ICD-10-CM | POA: Diagnosis not present

## 2018-02-02 DIAGNOSIS — E46 Unspecified protein-calorie malnutrition: Secondary | ICD-10-CM | POA: Diagnosis not present

## 2018-02-02 DIAGNOSIS — G309 Alzheimer's disease, unspecified: Secondary | ICD-10-CM | POA: Diagnosis not present

## 2018-02-02 DIAGNOSIS — E1159 Type 2 diabetes mellitus with other circulatory complications: Secondary | ICD-10-CM | POA: Diagnosis not present

## 2018-02-02 DIAGNOSIS — I10 Essential (primary) hypertension: Secondary | ICD-10-CM | POA: Diagnosis not present

## 2018-02-02 DIAGNOSIS — F339 Major depressive disorder, recurrent, unspecified: Secondary | ICD-10-CM | POA: Diagnosis not present

## 2018-02-05 DIAGNOSIS — E46 Unspecified protein-calorie malnutrition: Secondary | ICD-10-CM | POA: Diagnosis not present

## 2018-02-05 DIAGNOSIS — G309 Alzheimer's disease, unspecified: Secondary | ICD-10-CM | POA: Diagnosis not present

## 2018-02-05 DIAGNOSIS — F339 Major depressive disorder, recurrent, unspecified: Secondary | ICD-10-CM | POA: Diagnosis not present

## 2018-02-05 DIAGNOSIS — I10 Essential (primary) hypertension: Secondary | ICD-10-CM | POA: Diagnosis not present

## 2018-02-05 DIAGNOSIS — E1159 Type 2 diabetes mellitus with other circulatory complications: Secondary | ICD-10-CM | POA: Diagnosis not present

## 2018-02-05 DIAGNOSIS — I679 Cerebrovascular disease, unspecified: Secondary | ICD-10-CM | POA: Diagnosis not present

## 2018-02-07 DIAGNOSIS — I679 Cerebrovascular disease, unspecified: Secondary | ICD-10-CM | POA: Diagnosis not present

## 2018-02-07 DIAGNOSIS — F339 Major depressive disorder, recurrent, unspecified: Secondary | ICD-10-CM | POA: Diagnosis not present

## 2018-02-07 DIAGNOSIS — G309 Alzheimer's disease, unspecified: Secondary | ICD-10-CM | POA: Diagnosis not present

## 2018-02-07 DIAGNOSIS — I10 Essential (primary) hypertension: Secondary | ICD-10-CM | POA: Diagnosis not present

## 2018-02-07 DIAGNOSIS — E46 Unspecified protein-calorie malnutrition: Secondary | ICD-10-CM | POA: Diagnosis not present

## 2018-02-07 DIAGNOSIS — E1159 Type 2 diabetes mellitus with other circulatory complications: Secondary | ICD-10-CM | POA: Diagnosis not present

## 2018-02-09 DIAGNOSIS — I679 Cerebrovascular disease, unspecified: Secondary | ICD-10-CM | POA: Diagnosis not present

## 2018-02-09 DIAGNOSIS — G309 Alzheimer's disease, unspecified: Secondary | ICD-10-CM | POA: Diagnosis not present

## 2018-02-09 DIAGNOSIS — E46 Unspecified protein-calorie malnutrition: Secondary | ICD-10-CM | POA: Diagnosis not present

## 2018-02-09 DIAGNOSIS — I10 Essential (primary) hypertension: Secondary | ICD-10-CM | POA: Diagnosis not present

## 2018-02-09 DIAGNOSIS — F339 Major depressive disorder, recurrent, unspecified: Secondary | ICD-10-CM | POA: Diagnosis not present

## 2018-02-09 DIAGNOSIS — E1159 Type 2 diabetes mellitus with other circulatory complications: Secondary | ICD-10-CM | POA: Diagnosis not present

## 2018-02-11 DIAGNOSIS — F339 Major depressive disorder, recurrent, unspecified: Secondary | ICD-10-CM | POA: Diagnosis not present

## 2018-02-11 DIAGNOSIS — E1159 Type 2 diabetes mellitus with other circulatory complications: Secondary | ICD-10-CM | POA: Diagnosis not present

## 2018-02-11 DIAGNOSIS — I10 Essential (primary) hypertension: Secondary | ICD-10-CM | POA: Diagnosis not present

## 2018-02-11 DIAGNOSIS — G309 Alzheimer's disease, unspecified: Secondary | ICD-10-CM | POA: Diagnosis not present

## 2018-02-11 DIAGNOSIS — E46 Unspecified protein-calorie malnutrition: Secondary | ICD-10-CM | POA: Diagnosis not present

## 2018-02-11 DIAGNOSIS — I679 Cerebrovascular disease, unspecified: Secondary | ICD-10-CM | POA: Diagnosis not present

## 2018-02-12 DIAGNOSIS — G309 Alzheimer's disease, unspecified: Secondary | ICD-10-CM | POA: Diagnosis not present

## 2018-02-12 DIAGNOSIS — E46 Unspecified protein-calorie malnutrition: Secondary | ICD-10-CM | POA: Diagnosis not present

## 2018-02-12 DIAGNOSIS — I10 Essential (primary) hypertension: Secondary | ICD-10-CM | POA: Diagnosis not present

## 2018-02-12 DIAGNOSIS — E1159 Type 2 diabetes mellitus with other circulatory complications: Secondary | ICD-10-CM | POA: Diagnosis not present

## 2018-02-12 DIAGNOSIS — I679 Cerebrovascular disease, unspecified: Secondary | ICD-10-CM | POA: Diagnosis not present

## 2018-02-12 DIAGNOSIS — F339 Major depressive disorder, recurrent, unspecified: Secondary | ICD-10-CM | POA: Diagnosis not present

## 2018-02-14 DIAGNOSIS — I10 Essential (primary) hypertension: Secondary | ICD-10-CM | POA: Diagnosis not present

## 2018-02-14 DIAGNOSIS — E1159 Type 2 diabetes mellitus with other circulatory complications: Secondary | ICD-10-CM | POA: Diagnosis not present

## 2018-02-14 DIAGNOSIS — G309 Alzheimer's disease, unspecified: Secondary | ICD-10-CM | POA: Diagnosis not present

## 2018-02-14 DIAGNOSIS — I679 Cerebrovascular disease, unspecified: Secondary | ICD-10-CM | POA: Diagnosis not present

## 2018-02-14 DIAGNOSIS — F339 Major depressive disorder, recurrent, unspecified: Secondary | ICD-10-CM | POA: Diagnosis not present

## 2018-02-14 DIAGNOSIS — E46 Unspecified protein-calorie malnutrition: Secondary | ICD-10-CM | POA: Diagnosis not present

## 2018-02-16 DIAGNOSIS — G309 Alzheimer's disease, unspecified: Secondary | ICD-10-CM | POA: Diagnosis not present

## 2018-02-16 DIAGNOSIS — E1159 Type 2 diabetes mellitus with other circulatory complications: Secondary | ICD-10-CM | POA: Diagnosis not present

## 2018-02-16 DIAGNOSIS — F339 Major depressive disorder, recurrent, unspecified: Secondary | ICD-10-CM | POA: Diagnosis not present

## 2018-02-16 DIAGNOSIS — I679 Cerebrovascular disease, unspecified: Secondary | ICD-10-CM | POA: Diagnosis not present

## 2018-02-16 DIAGNOSIS — E46 Unspecified protein-calorie malnutrition: Secondary | ICD-10-CM | POA: Diagnosis not present

## 2018-02-16 DIAGNOSIS — I10 Essential (primary) hypertension: Secondary | ICD-10-CM | POA: Diagnosis not present

## 2018-02-17 DIAGNOSIS — E785 Hyperlipidemia, unspecified: Secondary | ICD-10-CM | POA: Diagnosis not present

## 2018-02-17 DIAGNOSIS — G309 Alzheimer's disease, unspecified: Secondary | ICD-10-CM | POA: Diagnosis not present

## 2018-02-17 DIAGNOSIS — F339 Major depressive disorder, recurrent, unspecified: Secondary | ICD-10-CM | POA: Diagnosis not present

## 2018-02-17 DIAGNOSIS — E1159 Type 2 diabetes mellitus with other circulatory complications: Secondary | ICD-10-CM | POA: Diagnosis not present

## 2018-02-17 DIAGNOSIS — I679 Cerebrovascular disease, unspecified: Secondary | ICD-10-CM | POA: Diagnosis not present

## 2018-02-17 DIAGNOSIS — M810 Age-related osteoporosis without current pathological fracture: Secondary | ICD-10-CM | POA: Diagnosis not present

## 2018-02-17 DIAGNOSIS — E46 Unspecified protein-calorie malnutrition: Secondary | ICD-10-CM | POA: Diagnosis not present

## 2018-02-17 DIAGNOSIS — I10 Essential (primary) hypertension: Secondary | ICD-10-CM | POA: Diagnosis not present

## 2018-02-17 DIAGNOSIS — K219 Gastro-esophageal reflux disease without esophagitis: Secondary | ICD-10-CM | POA: Diagnosis not present

## 2018-02-19 DIAGNOSIS — I10 Essential (primary) hypertension: Secondary | ICD-10-CM | POA: Diagnosis not present

## 2018-02-19 DIAGNOSIS — I679 Cerebrovascular disease, unspecified: Secondary | ICD-10-CM | POA: Diagnosis not present

## 2018-02-19 DIAGNOSIS — G309 Alzheimer's disease, unspecified: Secondary | ICD-10-CM | POA: Diagnosis not present

## 2018-02-19 DIAGNOSIS — E46 Unspecified protein-calorie malnutrition: Secondary | ICD-10-CM | POA: Diagnosis not present

## 2018-02-19 DIAGNOSIS — F339 Major depressive disorder, recurrent, unspecified: Secondary | ICD-10-CM | POA: Diagnosis not present

## 2018-02-19 DIAGNOSIS — E1159 Type 2 diabetes mellitus with other circulatory complications: Secondary | ICD-10-CM | POA: Diagnosis not present

## 2018-02-20 DIAGNOSIS — G309 Alzheimer's disease, unspecified: Secondary | ICD-10-CM | POA: Diagnosis not present

## 2018-02-20 DIAGNOSIS — F339 Major depressive disorder, recurrent, unspecified: Secondary | ICD-10-CM | POA: Diagnosis not present

## 2018-02-20 DIAGNOSIS — E46 Unspecified protein-calorie malnutrition: Secondary | ICD-10-CM | POA: Diagnosis not present

## 2018-02-20 DIAGNOSIS — I10 Essential (primary) hypertension: Secondary | ICD-10-CM | POA: Diagnosis not present

## 2018-02-20 DIAGNOSIS — E1159 Type 2 diabetes mellitus with other circulatory complications: Secondary | ICD-10-CM | POA: Diagnosis not present

## 2018-02-20 DIAGNOSIS — I679 Cerebrovascular disease, unspecified: Secondary | ICD-10-CM | POA: Diagnosis not present

## 2018-02-21 DIAGNOSIS — I679 Cerebrovascular disease, unspecified: Secondary | ICD-10-CM | POA: Diagnosis not present

## 2018-02-21 DIAGNOSIS — G309 Alzheimer's disease, unspecified: Secondary | ICD-10-CM | POA: Diagnosis not present

## 2018-02-21 DIAGNOSIS — E46 Unspecified protein-calorie malnutrition: Secondary | ICD-10-CM | POA: Diagnosis not present

## 2018-02-21 DIAGNOSIS — E1159 Type 2 diabetes mellitus with other circulatory complications: Secondary | ICD-10-CM | POA: Diagnosis not present

## 2018-02-21 DIAGNOSIS — F339 Major depressive disorder, recurrent, unspecified: Secondary | ICD-10-CM | POA: Diagnosis not present

## 2018-02-21 DIAGNOSIS — I10 Essential (primary) hypertension: Secondary | ICD-10-CM | POA: Diagnosis not present

## 2018-02-23 DIAGNOSIS — F339 Major depressive disorder, recurrent, unspecified: Secondary | ICD-10-CM | POA: Diagnosis not present

## 2018-02-23 DIAGNOSIS — I679 Cerebrovascular disease, unspecified: Secondary | ICD-10-CM | POA: Diagnosis not present

## 2018-02-23 DIAGNOSIS — I10 Essential (primary) hypertension: Secondary | ICD-10-CM | POA: Diagnosis not present

## 2018-02-23 DIAGNOSIS — E46 Unspecified protein-calorie malnutrition: Secondary | ICD-10-CM | POA: Diagnosis not present

## 2018-02-23 DIAGNOSIS — E1159 Type 2 diabetes mellitus with other circulatory complications: Secondary | ICD-10-CM | POA: Diagnosis not present

## 2018-02-23 DIAGNOSIS — G309 Alzheimer's disease, unspecified: Secondary | ICD-10-CM | POA: Diagnosis not present

## 2018-02-26 DIAGNOSIS — E46 Unspecified protein-calorie malnutrition: Secondary | ICD-10-CM | POA: Diagnosis not present

## 2018-02-26 DIAGNOSIS — I679 Cerebrovascular disease, unspecified: Secondary | ICD-10-CM | POA: Diagnosis not present

## 2018-02-26 DIAGNOSIS — E1159 Type 2 diabetes mellitus with other circulatory complications: Secondary | ICD-10-CM | POA: Diagnosis not present

## 2018-02-26 DIAGNOSIS — G309 Alzheimer's disease, unspecified: Secondary | ICD-10-CM | POA: Diagnosis not present

## 2018-02-26 DIAGNOSIS — F339 Major depressive disorder, recurrent, unspecified: Secondary | ICD-10-CM | POA: Diagnosis not present

## 2018-02-26 DIAGNOSIS — I10 Essential (primary) hypertension: Secondary | ICD-10-CM | POA: Diagnosis not present

## 2018-02-27 DIAGNOSIS — E1159 Type 2 diabetes mellitus with other circulatory complications: Secondary | ICD-10-CM | POA: Diagnosis not present

## 2018-02-27 DIAGNOSIS — F339 Major depressive disorder, recurrent, unspecified: Secondary | ICD-10-CM | POA: Diagnosis not present

## 2018-02-27 DIAGNOSIS — I679 Cerebrovascular disease, unspecified: Secondary | ICD-10-CM | POA: Diagnosis not present

## 2018-02-27 DIAGNOSIS — I10 Essential (primary) hypertension: Secondary | ICD-10-CM | POA: Diagnosis not present

## 2018-02-27 DIAGNOSIS — G309 Alzheimer's disease, unspecified: Secondary | ICD-10-CM | POA: Diagnosis not present

## 2018-02-27 DIAGNOSIS — E46 Unspecified protein-calorie malnutrition: Secondary | ICD-10-CM | POA: Diagnosis not present

## 2018-02-28 DIAGNOSIS — F339 Major depressive disorder, recurrent, unspecified: Secondary | ICD-10-CM | POA: Diagnosis not present

## 2018-02-28 DIAGNOSIS — E1159 Type 2 diabetes mellitus with other circulatory complications: Secondary | ICD-10-CM | POA: Diagnosis not present

## 2018-02-28 DIAGNOSIS — I10 Essential (primary) hypertension: Secondary | ICD-10-CM | POA: Diagnosis not present

## 2018-02-28 DIAGNOSIS — E46 Unspecified protein-calorie malnutrition: Secondary | ICD-10-CM | POA: Diagnosis not present

## 2018-02-28 DIAGNOSIS — I679 Cerebrovascular disease, unspecified: Secondary | ICD-10-CM | POA: Diagnosis not present

## 2018-02-28 DIAGNOSIS — G309 Alzheimer's disease, unspecified: Secondary | ICD-10-CM | POA: Diagnosis not present

## 2018-03-02 DIAGNOSIS — E1159 Type 2 diabetes mellitus with other circulatory complications: Secondary | ICD-10-CM | POA: Diagnosis not present

## 2018-03-02 DIAGNOSIS — I679 Cerebrovascular disease, unspecified: Secondary | ICD-10-CM | POA: Diagnosis not present

## 2018-03-02 DIAGNOSIS — I10 Essential (primary) hypertension: Secondary | ICD-10-CM | POA: Diagnosis not present

## 2018-03-02 DIAGNOSIS — E46 Unspecified protein-calorie malnutrition: Secondary | ICD-10-CM | POA: Diagnosis not present

## 2018-03-02 DIAGNOSIS — G309 Alzheimer's disease, unspecified: Secondary | ICD-10-CM | POA: Diagnosis not present

## 2018-03-02 DIAGNOSIS — F339 Major depressive disorder, recurrent, unspecified: Secondary | ICD-10-CM | POA: Diagnosis not present

## 2018-03-05 DIAGNOSIS — F339 Major depressive disorder, recurrent, unspecified: Secondary | ICD-10-CM | POA: Diagnosis not present

## 2018-03-05 DIAGNOSIS — E46 Unspecified protein-calorie malnutrition: Secondary | ICD-10-CM | POA: Diagnosis not present

## 2018-03-05 DIAGNOSIS — G309 Alzheimer's disease, unspecified: Secondary | ICD-10-CM | POA: Diagnosis not present

## 2018-03-05 DIAGNOSIS — I10 Essential (primary) hypertension: Secondary | ICD-10-CM | POA: Diagnosis not present

## 2018-03-05 DIAGNOSIS — I679 Cerebrovascular disease, unspecified: Secondary | ICD-10-CM | POA: Diagnosis not present

## 2018-03-05 DIAGNOSIS — E1159 Type 2 diabetes mellitus with other circulatory complications: Secondary | ICD-10-CM | POA: Diagnosis not present

## 2018-03-06 DIAGNOSIS — G309 Alzheimer's disease, unspecified: Secondary | ICD-10-CM | POA: Diagnosis not present

## 2018-03-06 DIAGNOSIS — E46 Unspecified protein-calorie malnutrition: Secondary | ICD-10-CM | POA: Diagnosis not present

## 2018-03-06 DIAGNOSIS — I10 Essential (primary) hypertension: Secondary | ICD-10-CM | POA: Diagnosis not present

## 2018-03-06 DIAGNOSIS — F339 Major depressive disorder, recurrent, unspecified: Secondary | ICD-10-CM | POA: Diagnosis not present

## 2018-03-06 DIAGNOSIS — E1159 Type 2 diabetes mellitus with other circulatory complications: Secondary | ICD-10-CM | POA: Diagnosis not present

## 2018-03-06 DIAGNOSIS — I679 Cerebrovascular disease, unspecified: Secondary | ICD-10-CM | POA: Diagnosis not present

## 2018-03-07 DIAGNOSIS — F339 Major depressive disorder, recurrent, unspecified: Secondary | ICD-10-CM | POA: Diagnosis not present

## 2018-03-07 DIAGNOSIS — I10 Essential (primary) hypertension: Secondary | ICD-10-CM | POA: Diagnosis not present

## 2018-03-07 DIAGNOSIS — E46 Unspecified protein-calorie malnutrition: Secondary | ICD-10-CM | POA: Diagnosis not present

## 2018-03-07 DIAGNOSIS — E1159 Type 2 diabetes mellitus with other circulatory complications: Secondary | ICD-10-CM | POA: Diagnosis not present

## 2018-03-07 DIAGNOSIS — I679 Cerebrovascular disease, unspecified: Secondary | ICD-10-CM | POA: Diagnosis not present

## 2018-03-07 DIAGNOSIS — G309 Alzheimer's disease, unspecified: Secondary | ICD-10-CM | POA: Diagnosis not present

## 2018-03-09 DIAGNOSIS — G309 Alzheimer's disease, unspecified: Secondary | ICD-10-CM | POA: Diagnosis not present

## 2018-03-09 DIAGNOSIS — I10 Essential (primary) hypertension: Secondary | ICD-10-CM | POA: Diagnosis not present

## 2018-03-09 DIAGNOSIS — I679 Cerebrovascular disease, unspecified: Secondary | ICD-10-CM | POA: Diagnosis not present

## 2018-03-09 DIAGNOSIS — E1159 Type 2 diabetes mellitus with other circulatory complications: Secondary | ICD-10-CM | POA: Diagnosis not present

## 2018-03-09 DIAGNOSIS — E46 Unspecified protein-calorie malnutrition: Secondary | ICD-10-CM | POA: Diagnosis not present

## 2018-03-09 DIAGNOSIS — F339 Major depressive disorder, recurrent, unspecified: Secondary | ICD-10-CM | POA: Diagnosis not present

## 2018-03-12 DIAGNOSIS — I679 Cerebrovascular disease, unspecified: Secondary | ICD-10-CM | POA: Diagnosis not present

## 2018-03-12 DIAGNOSIS — G309 Alzheimer's disease, unspecified: Secondary | ICD-10-CM | POA: Diagnosis not present

## 2018-03-12 DIAGNOSIS — E1159 Type 2 diabetes mellitus with other circulatory complications: Secondary | ICD-10-CM | POA: Diagnosis not present

## 2018-03-12 DIAGNOSIS — F339 Major depressive disorder, recurrent, unspecified: Secondary | ICD-10-CM | POA: Diagnosis not present

## 2018-03-12 DIAGNOSIS — I10 Essential (primary) hypertension: Secondary | ICD-10-CM | POA: Diagnosis not present

## 2018-03-12 DIAGNOSIS — E46 Unspecified protein-calorie malnutrition: Secondary | ICD-10-CM | POA: Diagnosis not present

## 2018-03-14 DIAGNOSIS — E1159 Type 2 diabetes mellitus with other circulatory complications: Secondary | ICD-10-CM | POA: Diagnosis not present

## 2018-03-14 DIAGNOSIS — I679 Cerebrovascular disease, unspecified: Secondary | ICD-10-CM | POA: Diagnosis not present

## 2018-03-14 DIAGNOSIS — E46 Unspecified protein-calorie malnutrition: Secondary | ICD-10-CM | POA: Diagnosis not present

## 2018-03-14 DIAGNOSIS — F339 Major depressive disorder, recurrent, unspecified: Secondary | ICD-10-CM | POA: Diagnosis not present

## 2018-03-14 DIAGNOSIS — G309 Alzheimer's disease, unspecified: Secondary | ICD-10-CM | POA: Diagnosis not present

## 2018-03-14 DIAGNOSIS — I10 Essential (primary) hypertension: Secondary | ICD-10-CM | POA: Diagnosis not present

## 2018-03-15 DIAGNOSIS — I10 Essential (primary) hypertension: Secondary | ICD-10-CM | POA: Diagnosis not present

## 2018-03-15 DIAGNOSIS — E46 Unspecified protein-calorie malnutrition: Secondary | ICD-10-CM | POA: Diagnosis not present

## 2018-03-15 DIAGNOSIS — G309 Alzheimer's disease, unspecified: Secondary | ICD-10-CM | POA: Diagnosis not present

## 2018-03-15 DIAGNOSIS — E1159 Type 2 diabetes mellitus with other circulatory complications: Secondary | ICD-10-CM | POA: Diagnosis not present

## 2018-03-15 DIAGNOSIS — F339 Major depressive disorder, recurrent, unspecified: Secondary | ICD-10-CM | POA: Diagnosis not present

## 2018-03-15 DIAGNOSIS — I679 Cerebrovascular disease, unspecified: Secondary | ICD-10-CM | POA: Diagnosis not present

## 2018-03-16 DIAGNOSIS — E46 Unspecified protein-calorie malnutrition: Secondary | ICD-10-CM | POA: Diagnosis not present

## 2018-03-16 DIAGNOSIS — F339 Major depressive disorder, recurrent, unspecified: Secondary | ICD-10-CM | POA: Diagnosis not present

## 2018-03-16 DIAGNOSIS — G309 Alzheimer's disease, unspecified: Secondary | ICD-10-CM | POA: Diagnosis not present

## 2018-03-16 DIAGNOSIS — I10 Essential (primary) hypertension: Secondary | ICD-10-CM | POA: Diagnosis not present

## 2018-03-16 DIAGNOSIS — I679 Cerebrovascular disease, unspecified: Secondary | ICD-10-CM | POA: Diagnosis not present

## 2018-03-16 DIAGNOSIS — E1159 Type 2 diabetes mellitus with other circulatory complications: Secondary | ICD-10-CM | POA: Diagnosis not present

## 2018-03-18 DIAGNOSIS — M810 Age-related osteoporosis without current pathological fracture: Secondary | ICD-10-CM | POA: Diagnosis not present

## 2018-03-18 DIAGNOSIS — F339 Major depressive disorder, recurrent, unspecified: Secondary | ICD-10-CM | POA: Diagnosis not present

## 2018-03-18 DIAGNOSIS — E785 Hyperlipidemia, unspecified: Secondary | ICD-10-CM | POA: Diagnosis not present

## 2018-03-18 DIAGNOSIS — I10 Essential (primary) hypertension: Secondary | ICD-10-CM | POA: Diagnosis not present

## 2018-03-18 DIAGNOSIS — K219 Gastro-esophageal reflux disease without esophagitis: Secondary | ICD-10-CM | POA: Diagnosis not present

## 2018-03-18 DIAGNOSIS — G309 Alzheimer's disease, unspecified: Secondary | ICD-10-CM | POA: Diagnosis not present

## 2018-03-18 DIAGNOSIS — I679 Cerebrovascular disease, unspecified: Secondary | ICD-10-CM | POA: Diagnosis not present

## 2018-03-18 DIAGNOSIS — E46 Unspecified protein-calorie malnutrition: Secondary | ICD-10-CM | POA: Diagnosis not present

## 2018-03-18 DIAGNOSIS — E1159 Type 2 diabetes mellitus with other circulatory complications: Secondary | ICD-10-CM | POA: Diagnosis not present

## 2018-03-19 DIAGNOSIS — I679 Cerebrovascular disease, unspecified: Secondary | ICD-10-CM | POA: Diagnosis not present

## 2018-03-19 DIAGNOSIS — I10 Essential (primary) hypertension: Secondary | ICD-10-CM | POA: Diagnosis not present

## 2018-03-19 DIAGNOSIS — E1159 Type 2 diabetes mellitus with other circulatory complications: Secondary | ICD-10-CM | POA: Diagnosis not present

## 2018-03-19 DIAGNOSIS — G309 Alzheimer's disease, unspecified: Secondary | ICD-10-CM | POA: Diagnosis not present

## 2018-03-19 DIAGNOSIS — E46 Unspecified protein-calorie malnutrition: Secondary | ICD-10-CM | POA: Diagnosis not present

## 2018-03-19 DIAGNOSIS — F339 Major depressive disorder, recurrent, unspecified: Secondary | ICD-10-CM | POA: Diagnosis not present

## 2018-03-21 DIAGNOSIS — E1159 Type 2 diabetes mellitus with other circulatory complications: Secondary | ICD-10-CM | POA: Diagnosis not present

## 2018-03-21 DIAGNOSIS — I679 Cerebrovascular disease, unspecified: Secondary | ICD-10-CM | POA: Diagnosis not present

## 2018-03-21 DIAGNOSIS — F339 Major depressive disorder, recurrent, unspecified: Secondary | ICD-10-CM | POA: Diagnosis not present

## 2018-03-21 DIAGNOSIS — I10 Essential (primary) hypertension: Secondary | ICD-10-CM | POA: Diagnosis not present

## 2018-03-21 DIAGNOSIS — G309 Alzheimer's disease, unspecified: Secondary | ICD-10-CM | POA: Diagnosis not present

## 2018-03-21 DIAGNOSIS — E46 Unspecified protein-calorie malnutrition: Secondary | ICD-10-CM | POA: Diagnosis not present

## 2018-03-23 DIAGNOSIS — I679 Cerebrovascular disease, unspecified: Secondary | ICD-10-CM | POA: Diagnosis not present

## 2018-03-23 DIAGNOSIS — E46 Unspecified protein-calorie malnutrition: Secondary | ICD-10-CM | POA: Diagnosis not present

## 2018-03-23 DIAGNOSIS — I10 Essential (primary) hypertension: Secondary | ICD-10-CM | POA: Diagnosis not present

## 2018-03-23 DIAGNOSIS — E1159 Type 2 diabetes mellitus with other circulatory complications: Secondary | ICD-10-CM | POA: Diagnosis not present

## 2018-03-23 DIAGNOSIS — G309 Alzheimer's disease, unspecified: Secondary | ICD-10-CM | POA: Diagnosis not present

## 2018-03-23 DIAGNOSIS — F339 Major depressive disorder, recurrent, unspecified: Secondary | ICD-10-CM | POA: Diagnosis not present

## 2018-03-24 DIAGNOSIS — F339 Major depressive disorder, recurrent, unspecified: Secondary | ICD-10-CM | POA: Diagnosis not present

## 2018-03-24 DIAGNOSIS — I679 Cerebrovascular disease, unspecified: Secondary | ICD-10-CM | POA: Diagnosis not present

## 2018-03-24 DIAGNOSIS — G309 Alzheimer's disease, unspecified: Secondary | ICD-10-CM | POA: Diagnosis not present

## 2018-03-24 DIAGNOSIS — E1159 Type 2 diabetes mellitus with other circulatory complications: Secondary | ICD-10-CM | POA: Diagnosis not present

## 2018-03-24 DIAGNOSIS — E46 Unspecified protein-calorie malnutrition: Secondary | ICD-10-CM | POA: Diagnosis not present

## 2018-03-24 DIAGNOSIS — I10 Essential (primary) hypertension: Secondary | ICD-10-CM | POA: Diagnosis not present

## 2018-03-26 DIAGNOSIS — I10 Essential (primary) hypertension: Secondary | ICD-10-CM | POA: Diagnosis not present

## 2018-03-26 DIAGNOSIS — I679 Cerebrovascular disease, unspecified: Secondary | ICD-10-CM | POA: Diagnosis not present

## 2018-03-26 DIAGNOSIS — E1159 Type 2 diabetes mellitus with other circulatory complications: Secondary | ICD-10-CM | POA: Diagnosis not present

## 2018-03-26 DIAGNOSIS — G309 Alzheimer's disease, unspecified: Secondary | ICD-10-CM | POA: Diagnosis not present

## 2018-03-26 DIAGNOSIS — F339 Major depressive disorder, recurrent, unspecified: Secondary | ICD-10-CM | POA: Diagnosis not present

## 2018-03-26 DIAGNOSIS — E46 Unspecified protein-calorie malnutrition: Secondary | ICD-10-CM | POA: Diagnosis not present

## 2018-03-28 DIAGNOSIS — F339 Major depressive disorder, recurrent, unspecified: Secondary | ICD-10-CM | POA: Diagnosis not present

## 2018-03-28 DIAGNOSIS — I10 Essential (primary) hypertension: Secondary | ICD-10-CM | POA: Diagnosis not present

## 2018-03-28 DIAGNOSIS — E1159 Type 2 diabetes mellitus with other circulatory complications: Secondary | ICD-10-CM | POA: Diagnosis not present

## 2018-03-28 DIAGNOSIS — I679 Cerebrovascular disease, unspecified: Secondary | ICD-10-CM | POA: Diagnosis not present

## 2018-03-28 DIAGNOSIS — E46 Unspecified protein-calorie malnutrition: Secondary | ICD-10-CM | POA: Diagnosis not present

## 2018-03-28 DIAGNOSIS — G309 Alzheimer's disease, unspecified: Secondary | ICD-10-CM | POA: Diagnosis not present

## 2018-03-30 DIAGNOSIS — E46 Unspecified protein-calorie malnutrition: Secondary | ICD-10-CM | POA: Diagnosis not present

## 2018-03-30 DIAGNOSIS — F339 Major depressive disorder, recurrent, unspecified: Secondary | ICD-10-CM | POA: Diagnosis not present

## 2018-03-30 DIAGNOSIS — E1159 Type 2 diabetes mellitus with other circulatory complications: Secondary | ICD-10-CM | POA: Diagnosis not present

## 2018-03-30 DIAGNOSIS — I10 Essential (primary) hypertension: Secondary | ICD-10-CM | POA: Diagnosis not present

## 2018-03-30 DIAGNOSIS — G309 Alzheimer's disease, unspecified: Secondary | ICD-10-CM | POA: Diagnosis not present

## 2018-03-30 DIAGNOSIS — I679 Cerebrovascular disease, unspecified: Secondary | ICD-10-CM | POA: Diagnosis not present

## 2018-04-02 DIAGNOSIS — G309 Alzheimer's disease, unspecified: Secondary | ICD-10-CM | POA: Diagnosis not present

## 2018-04-02 DIAGNOSIS — F339 Major depressive disorder, recurrent, unspecified: Secondary | ICD-10-CM | POA: Diagnosis not present

## 2018-04-02 DIAGNOSIS — I679 Cerebrovascular disease, unspecified: Secondary | ICD-10-CM | POA: Diagnosis not present

## 2018-04-02 DIAGNOSIS — E1159 Type 2 diabetes mellitus with other circulatory complications: Secondary | ICD-10-CM | POA: Diagnosis not present

## 2018-04-02 DIAGNOSIS — E46 Unspecified protein-calorie malnutrition: Secondary | ICD-10-CM | POA: Diagnosis not present

## 2018-04-02 DIAGNOSIS — I10 Essential (primary) hypertension: Secondary | ICD-10-CM | POA: Diagnosis not present

## 2018-04-11 ENCOUNTER — Telehealth: Payer: Self-pay

## 2018-04-11 NOTE — Telephone Encounter (Signed)
Phone call placed to patient to offer to schedule visit with Palliative care. Spoke with patient's husband. Visit scheduled for 04/13/2018.

## 2018-04-11 NOTE — Telephone Encounter (Signed)
Received phone call from Dr. Michaelle Copas office. Patient has a hospital bed currently from when patient was on hospice. Per husband, patient still requires hospital bed. Will update Talbert Forest NP for further direction.

## 2018-04-12 ENCOUNTER — Other Ambulatory Visit: Payer: Self-pay

## 2018-04-12 ENCOUNTER — Other Ambulatory Visit: Payer: Medicare Other | Admitting: Internal Medicine

## 2018-04-12 DIAGNOSIS — G309 Alzheimer's disease, unspecified: Secondary | ICD-10-CM | POA: Diagnosis not present

## 2018-04-12 DIAGNOSIS — I10 Essential (primary) hypertension: Secondary | ICD-10-CM | POA: Diagnosis not present

## 2018-04-12 DIAGNOSIS — F419 Anxiety disorder, unspecified: Secondary | ICD-10-CM | POA: Diagnosis not present

## 2018-04-12 DIAGNOSIS — F321 Major depressive disorder, single episode, moderate: Secondary | ICD-10-CM | POA: Diagnosis not present

## 2018-04-12 DIAGNOSIS — F0390 Unspecified dementia without behavioral disturbance: Secondary | ICD-10-CM | POA: Diagnosis not present

## 2018-04-12 DIAGNOSIS — E1169 Type 2 diabetes mellitus with other specified complication: Secondary | ICD-10-CM | POA: Diagnosis not present

## 2018-04-12 DIAGNOSIS — K219 Gastro-esophageal reflux disease without esophagitis: Secondary | ICD-10-CM | POA: Diagnosis not present

## 2018-04-13 ENCOUNTER — Encounter: Payer: Medicare Other | Admitting: Internal Medicine

## 2018-04-13 DIAGNOSIS — Z515 Encounter for palliative care: Secondary | ICD-10-CM

## 2018-05-17 ENCOUNTER — Other Ambulatory Visit: Payer: Medicare Other | Admitting: Internal Medicine

## 2018-05-18 DIAGNOSIS — Z515 Encounter for palliative care: Secondary | ICD-10-CM | POA: Insufficient documentation

## 2018-05-18 NOTE — Progress Notes (Signed)
    Therapist, nutritional Palliative Care Consult Note Telephone: [phone]  Fax: [phone]  PATIENT NAME: Autumn Schneider DOB: 15-Aug-1940 MRN: [id number]  PRIMARY CARE PROVIDER:   Merri Brunette, MD  REFERRING PROVIDER:  Merri Brunette, MD [phone] Autumn Schneider [address] [place], [place] [address]  RESPONSIBLE PARTYWindy Schneider [phone]     RECOMMENDATIONS and PLAN:  Palliative Care Encounter Z51.5  1.Memory Loss:  Chronic state related to Alzheimer's.  Schneider stage 7a. Stable nutritional intake and ambulation in the home.  Maintained with use of Namenda. Continue to monitor and provide supportive care..  2.  Insomnia: No improvement with use of Melatonin 9mg  qHS, Remeron 30mg  or Lorazepam 0.5mg  prn.  Exacerbated with pruritis from new rash. Aromatherapy.  Begin Vistaril 25mg  PO qHS but also may be given during day hours if needed. (Approval received from communication with Dr. Katrinka Blazing via nurse Autumn Schneider).  Hold use of Lorazepam while taking Vistaril.  Continue to monitor for potential fall risk.    3.  Constipation: Insufficient results with use of Senna 3tabs @HS .   Improve hydration and increase dietary fiber. Restart Miralax qday with 8oz of liquid if pt is able to continue increased hydration. Adjust cathartic dosing if loose stools occur.   4.  Advanced Care Planning: Code status remains DNR.  Goal of husband is to provide supportive comfort care with assistance of son and an aid 2x/week.  Pt. Will continue to reside in the home with family at this time.  Palliative Care will continue to follow monthly.  Due to the COVID-19 crisis, this visit was performed via a previously scheduled telephonic evaluation from my office and was initiated and consented by this patient and or family.  I spent 30 minutes providing this consultation,  from 1400 to 1430. More than 50% of the time in this consultation was spent coordinating communication with the patient, her husband and  nurse Autumn Schneider at office of PCP.   HISTORY OF PRESENT ILLNESS: Follow-up with Autumn Schneider.  Husband reports that pt is having greater difficulty with sleeping at night.  She continues to walk throughout the house and be "busy"  even with use of Melatonin and prn Lorazepam at bedtime. She has not attempted to leave their home.  He reports that she continue to eat well and her weight is stable at 101#. She has an "itchy rash" on her lower back and buttocks that is being treated but she scratches at these ares often.  No reports of other acute illnesses.  Palliative Care was asked to help address goals of care.   CODE STATUS: DNAR  PPS: 60% HOSPICE ELIGIBILITY/DIAGNOSIS: TBD  PAST MEDICAL HISTORY:  Past Medical History:  Diagnosis Date  . Alzheimer disease    diagnosed 12/12  . Anxiety and depression   . Dementia   . Diabetes mellitus   . Esophageal reflux   . High cholesterol   . Hypertension   . Hyponatremia   . Migraine   . Osteoporosis   . SIADH (syndrome of inappropriate ADH production) (HCC)   . TIA (transient ischemic attack)   . Vitamin D deficiency    PHYSICAL EXAM:   General: NAD per response of spouse Pulmonary: Non forced speech Extremities: no edema per spouse Skin: rash of lower back and buttocks per spouse Neurological: Alert but confused. Speaks 1-3 words clearly and babbles otherwise.  Autumn Sheffield, NP-C

## 2018-05-22 NOTE — Progress Notes (Unsigned)
    Therapist, nutritional Palliative Care Consult Note Telephone: [phone]  Fax: [phone]  PATIENT NAME: Autumn Schneider DOB: [birth year] MRN: [id number]  PRIMARY CARE PROVIDER:   Merri Brunette, MD  REFERRING PROVIDER:  Merri Brunette, MD [phone] [address] [place], [place] [address]  RESPONSIBLE PARTY:  Windy Fast Mccary(husband) [phone]     RECOMMENDATIONS and PLAN:  Palliative Care Encounter Z51.5  1.Memory Loss:  Chronic state related to Alzheimer's.  FAST stage 7a. Managed with use of Namenda 10mg  BID.  Continue supportive care.  2. Pruritis:  Related to new rash of torso and buttocks.  Continue topical meds per PCP.  Add Vistaril 25mg  TID prn itching(especially at bedtime).  2.  Insomnia: Increase  Melatonin to 10mg  qHS, Hold Ativan at HS if taking Vistaril.   Aromatherapy with Lavender, Camomile tea. Avoid daytime napping.    4.  Advanced Care Planning: Code status DNR.  Supportive and direct care by husband, son and care giver.  Palliative Care will continue to monitor for cognitive and functional abilities.    I spent 30 minutes providing this consultation,  from 1300 to 1330 at home. More than 50% of the time in this consultation was spent coordinating communication with the patient and her husband.   HISTORY OF PRESENT ILLNESS: Follow-up with Thi Posch. Husband reports that she continues to eat well and requires some assistance with meals  She requires assistance with her ADLs and toileting.  She continues to have difficulty sleeping at night and moreso with scratching new rash of her lower back and buttocks that is being treated.  No improvement of sleep with use of Ativan  Palliative Care was asked to help address goals of care.

## 2018-05-22 NOTE — Progress Notes (Signed)
    Therapist, nutritional Palliative Care Consult Note Telephone: [phone]  Fax: [phone]  PATIENT NAME: Autumn Schneider DOB: [birth year] MRN: [id number]  NOTE:   Erroneous.  Disregard note   Margaretha Sheffield, NP

## 2018-07-19 ENCOUNTER — Telehealth: Payer: Self-pay | Admitting: Internal Medicine

## 2018-07-19 NOTE — Telephone Encounter (Signed)
Phoned Mr. Mayor at listed telephone number for previously scheduled palliative follow-up.  Voicemail message was received and I left a message for him to return my call for further discussion.  Gonzella Lex, NP-C

## 2018-09-20 ENCOUNTER — Other Ambulatory Visit: Payer: Self-pay

## 2018-09-20 ENCOUNTER — Encounter (HOSPITAL_COMMUNITY): Payer: Self-pay | Admitting: Emergency Medicine

## 2018-09-20 ENCOUNTER — Emergency Department (HOSPITAL_COMMUNITY)
Admission: EM | Admit: 2018-09-20 | Discharge: 2018-09-20 | Disposition: A | Discharge status: Home / Self Care | Payer: Medicare Other | Attending: Emergency Medicine | Admitting: Emergency Medicine

## 2018-09-20 ENCOUNTER — Emergency Department (HOSPITAL_COMMUNITY): Payer: Medicare Other

## 2018-09-20 DIAGNOSIS — F0280 Dementia in other diseases classified elsewhere without behavioral disturbance: Secondary | ICD-10-CM | POA: Insufficient documentation

## 2018-09-20 DIAGNOSIS — Z7984 Long term (current) use of oral hypoglycemic drugs: Secondary | ICD-10-CM | POA: Insufficient documentation

## 2018-09-20 DIAGNOSIS — E119 Type 2 diabetes mellitus without complications: Secondary | ICD-10-CM | POA: Insufficient documentation

## 2018-09-20 DIAGNOSIS — Z79899 Other long term (current) drug therapy: Secondary | ICD-10-CM | POA: Diagnosis not present

## 2018-09-20 DIAGNOSIS — R269 Unspecified abnormalities of gait and mobility: Secondary | ICD-10-CM | POA: Diagnosis not present

## 2018-09-20 DIAGNOSIS — Z7982 Long term (current) use of aspirin: Secondary | ICD-10-CM | POA: Diagnosis not present

## 2018-09-20 DIAGNOSIS — I10 Essential (primary) hypertension: Secondary | ICD-10-CM | POA: Insufficient documentation

## 2018-09-20 DIAGNOSIS — G309 Alzheimer's disease, unspecified: Secondary | ICD-10-CM | POA: Insufficient documentation

## 2018-09-20 NOTE — ED Notes (Signed)
Patient transported to CT 

## 2018-09-20 NOTE — Discharge Instructions (Addendum)
Followup with your doctor as needed

## 2018-09-20 NOTE — ED Provider Notes (Signed)
[facility] EMERGENCY DEPARTMENT Provider Note   CSN: [id number] Arrival date & time: 09/20/18  1815     History   Chief Complaint Chief Complaint  Patient presents with  . Gait Problem    HPI Autumn Schneider is a 78 y.o. female.    Level 5 caveat due to dementia. HPI Patient has no complaints.  Husband is however here now and states that she was walking a little abnormally.  Seem to be moving her right side not as well as her left side.  No trauma.  Has had previous TIAs and potentially strokes.  Patient would not want much intervention however if she did have a stroke.  Reviewing records palliative care is previously been consulted.  No trauma.  There was question of pain in her right shoulder but denies it now and no tenderness. Past Medical History:  Diagnosis Date  . Alzheimer disease (HCC)    diagnosed 12/12  . Anxiety and depression   . Dementia (HCC)   . Diabetes mellitus   . Esophageal reflux   . High cholesterol   . Hypertension   . Hyponatremia   . Migraine   . Osteoporosis   . SIADH (syndrome of inappropriate ADH production) (HCC)   . TIA (transient ischemic attack)   . Vitamin D deficiency     Patient Active Problem List   Diagnosis Date Noted  . Palliative care encounter 05/18/2018  . Fall 11/03/2014  . Diabetes mellitus type 2, uncontrolled (HCC) 11/03/2014  . Alzheimer's dementia (HCC) 11/03/2014  . Depression 11/03/2014  . Hypercholesteremia 11/03/2014  . Elevated CK 11/03/2014  . TIA (transient ischemic attack) 03/07/2011  . Hyponatremia 03/07/2011  . EOSINOPHILIA 03/13/2008  . PRURITUS 02/21/2008  . DIABETES, TYPE 2 05/17/2007  . Hypertension 05/17/2007  . EUSTACHIAN TUBE DYSFUNCTION 05/01/2007  . LARYNGITIS, ACUTE 05/01/2007  . ALLERGIC RHINITIS 05/01/2007    Past Surgical History:  Procedure Laterality Date  . CESAREAN SECTION    . SHOULDER SURGERY  2004     OB History   No obstetric history on file.      Home  Medications    Prior to Admission medications   Medication Sig Start Date End Date Taking? Authorizing Provider  ACCU-CHEK SMARTVIEW test strip  05/08/12   [provider]  amLODipine (NORVASC) 10 MG tablet Take 10 mg by mouth daily.    [provider]  aspirin EC 81 MG tablet Take 81 mg by mouth daily.    [provider]  atorvastatin (LIPITOR) 80 MG tablet Take 1 tablet (80 mg total) by mouth daily. 05/19/17 05/19/18  Roberto Scales D, MD  Calcium Carbonate-Vitamin D (CALTRATE 600+D) 600-400 MG-UNIT per tablet Take 1 tablet by mouth daily.    [provider]  clopidogrel (PLAVIX) 75 MG tablet Take 1 tablet (75 mg total) by mouth daily. 07/11/17   Ihor Austin, NP  clorazepate (TRANXENE) 3.75 MG tablet Take 3.75 mg by mouth 2 (two) times daily.  01/27/15   [provider]  denosumab (PROLIA) 60 MG/ML SOSY injection Inject 60 mg into the skin every 6 (six) months.    [provider]  feeding supplement, ENSURE ENLIVE, (ENSURE ENLIVE) LIQD Take 237 mLs by mouth 2 (two) times daily between meals. 05/19/17   Roberto Scales D, MD  Melatonin 10 MG TABS Take 1 tablet by mouth at bedtime.    [provider]  memantine (NAMENDA) 10 MG tablet Take 10 mg by mouth 2 (two) times  daily.    [provider]  metFORMIN (GLUCOPHAGE) 500 MG tablet Take 1,000 mg by mouth 2 (two) times daily.  07/02/12   [provider]  mirtazapine (REMERON) 30 MG tablet Take 30 mg by mouth at bedtime. 04/18/17   [provider]  pantoprazole (PROTONIX) 40 MG tablet Take 40 mg by mouth daily.    [provider]  sodium chloride 1 G tablet Take 1 g by mouth 2 (two) times daily with a meal.     [provider]    Family History Family History  Problem Relation Age of Onset  . Stroke Mother   . COPD Mother   . Diabetes Sister   . Breast cancer Sister   . Fibromyalgia Sister   . Stroke Maternal Grandmother   . CVA Maternal  Grandmother   . Heart attack Maternal Grandfather     Social History Social History   Tobacco Use  . Smoking status: Never Smoker  . Smokeless tobacco: Never Used  Substance Use Topics  . Alcohol use: No  . Drug use: No     Allergies   Aricept [donepezil hcl] and Erythromycin   Review of Systems Review of Systems  Unable to perform ROS: Dementia     Physical Exam Updated Vital Signs BP (!) 154/58   Pulse (!) 57   Temp 98.6 F (37 C) (Oral)   Resp 13   SpO2 100%   Physical Exam Vitals signs and nursing note reviewed.  Constitutional:      Appearance: Normal appearance.  HENT:     Head: Atraumatic.  Eyes:     Extraocular Movements: Extraocular movements intact.     Pupils: Pupils are equal, round, and reactive to light.  Neck:     Musculoskeletal: Neck supple. No muscular tenderness.  Cardiovascular:     Rate and Rhythm: Regular rhythm.  Pulmonary:     Effort: Pulmonary effort is normal.     Breath sounds: Normal breath sounds.  Abdominal:     Palpations: Abdomen is soft.  Skin:    General: Skin is warm.     Capillary Refill: Capillary refill takes less than 2 seconds.  Neurological:     Comments: Finger-nose intact.  Good strength in lower extremities.  Able to ambulate but may be not as coordinated on the right side compared to the left.  May drag foot a little bit.  Still swings both arms however.  Patient is at her baseline dementia      ED Treatments / Results  Labs (all labs ordered are listed, but only abnormal results are displayed) Labs Reviewed - No data to display  EKG None  Radiology Ct Head Wo Contrast  Result Date: 09/20/2018 CLINICAL DATA:  Patient with abnormal walking. EXAM: CT HEAD WITHOUT CONTRAST TECHNIQUE: Contiguous axial images were obtained from the base of the skull through the vertex without intravenous contrast. COMPARISON:  Brain CT 11/02/2014. FINDINGS: Brain: Ventricles and sulci are prominent compatible with atrophy.  Periventricular and subcortical white matter hypodensities compatible with chronic microvascular ischemic changes. No evidence for acute cortically based infarct, intracranial hemorrhage, mass lesion or mass-effect. Vascular: Unremarkable Skull: Intact. Sinuses/Orbits: Paranasal sinuses are well aerated. Underpneumatized right mastoid air cells. Other: None. IMPRESSION: No acute intracranial process. Atrophy and chronic microvascular ischemic changes. Electronically Signed   By: Annia Beltrew  Davis M.D.   On: 09/20/2018 19:59    Procedures Procedures (including critical care time)  Medications Ordered in ED Medications - No data to  display   Initial Impression / Assessment and Plan / ED Course  I have reviewed the triage vital signs and the nursing notes.  Pertinent labs & imaging results that were available during my care of the patient were reviewed by me and considered in my medical decision making (see chart for details).        Patient with some difficulty walking.  Discussed with patient's husband.  Will want little intervention done.  CT scan done and reassuring.  Discussed with husband how this does not rule out a stroke but since she would not really do anything different if we knew there was a stroke patient discharged home with outpatient follow-up as needed.  Final Clinical Impressions(s) / ED Diagnoses   Final diagnoses:  Gait disturbance    ED Discharge Orders    None       Davonna Belling, MD 09/20/18 2244

## 2018-09-20 NOTE — ED Notes (Signed)
Patient verbalizes understanding of discharge instructions. Opportunity for questioning and answers were provided. Armband removed by staff, pt discharged from ED ambulatory to home.  

## 2018-09-20 NOTE — ED Triage Notes (Signed)
Pt arrives via EMS from home with reports of gait issues. Family states she was walking abnormal and holding right shoulder. EMS reports pt was walking around house with nothing noted, negative stroke assessment.

## 2018-10-09 ENCOUNTER — Other Ambulatory Visit: Payer: Self-pay | Admitting: Family Medicine

## 2018-10-09 ENCOUNTER — Ambulatory Visit
Admission: RE | Admit: 2018-10-09 | Discharge: 2018-10-09 | Disposition: A | Discharge status: Home / Self Care | Payer: Medicare Other | Source: Ambulatory Visit | Attending: Family Medicine | Admitting: Family Medicine

## 2018-10-09 ENCOUNTER — Other Ambulatory Visit: Payer: Self-pay

## 2018-10-09 DIAGNOSIS — M25572 Pain in left ankle and joints of left foot: Secondary | ICD-10-CM

## 2018-12-03 ENCOUNTER — Telehealth: Payer: Self-pay

## 2018-12-03 NOTE — Telephone Encounter (Signed)
Received phone call from patient's husband requesting to schedule a visit with Gonzella Lex NP. Scheduled for 12/04/2018 via Telephone per husband request

## 2018-12-04 ENCOUNTER — Other Ambulatory Visit: Payer: Self-pay

## 2018-12-04 ENCOUNTER — Other Ambulatory Visit: Payer: Medicare Other | Admitting: Internal Medicine

## 2019-01-01 ENCOUNTER — Other Ambulatory Visit: Payer: Medicare Other | Admitting: Internal Medicine

## 2019-01-01 ENCOUNTER — Other Ambulatory Visit: Payer: Self-pay

## 2019-01-01 DIAGNOSIS — Z515 Encounter for palliative care: Secondary | ICD-10-CM

## 2019-01-01 DIAGNOSIS — F0280 Dementia in other diseases classified elsewhere without behavioral disturbance: Secondary | ICD-10-CM

## 2019-01-01 NOTE — Progress Notes (Signed)
Therapist, nutritional Palliative Care Consult Note Telephone: [phone]  Fax: [phone]  PATIENT NAME: Autumn Schneider DOB: [birth year] MRN: [id number]  PRIMARY CARE PROVIDER:   Merri Brunette, MD  REFERRING PROVIDER:  Merri Brunette, MD [phone] [address] [place],  [place] [address]  RESPONSIBLE PARTY:   Arbutus Leas  RECOMMENDATIONS and PLAN:  Palliative Care Encounter  Z51.5  1.  Advance Care Planning: Spouse desires to continue pt's residence at their home and receive assistance with her ADLs from family and caregiver Avoid return to hospital.Follow-up with patient and spouse iin 4-6 weeks.  Spouse will call sooner if needed for additional decline. Consider transition to Hospice care when additional significant decline occurs.  2. Cognitive deficit related to dementia.  FAST stage 7a. Monitor for additional decline and continue to provide safe environment.  Assist with all ADLs.  Due to the COVID-19 crisis, this visit was performed telephonically and was initiated by patient and/or family member  I spent 35 minutes providing this consultation,  from 1500 to 1535. More than 50% of the time in this consultation was spent coordinating communication spouse.   HISTORY OF PRESENT ILLNESS: Follow-up with Autumn Schneider Husband reports that she continues to sleep increased number of hours day and night but is still ambulatory throughout home and has a good appetite.  He denies any falls, allopements from home or illness.  She requires assistance with all ADLs, personal hygience and some assistance with meals.  Palliative Care was asked to help address goals of care.   CODE STATUS: DNAR  PPS: 50% HOSPICE ELIGIBILITY/DIAGNOSIS: TBD  PAST MEDICAL HISTORY:  Past Medical History:  Diagnosis Date  . Alzheimer disease (HCC)    diagnosed 12/12  . Anxiety and depression   . Dementia (HCC)   . Diabetes mellitus   . Esophageal reflux   . High cholesterol   .  Hypertension   . Hyponatremia   . Migraine   . Osteoporosis   . SIADH (syndrome of inappropriate ADH production) (HCC)   . TIA (transient ischemic attack)   . Vitamin D deficiency       PERTINENT MEDICATIONS:  Outpatient Encounter Medications as of 01/01/2019  Medication Sig  . ACCU-CHEK SMARTVIEW test strip   . amLODipine (NORVASC) 10 MG tablet Take 10 mg by mouth daily.  Marland Kitchen aspirin EC 81 MG tablet Take 81 mg by mouth daily.  Marland Kitchen atorvastatin (LIPITOR) 80 MG tablet Take 1 tablet (80 mg total) by mouth daily.  . Calcium Carbonate-Vitamin D (CALTRATE 600+D) 600-400 MG-UNIT per tablet Take 1 tablet by mouth daily.  . clopidogrel (PLAVIX) 75 MG tablet Take 1 tablet (75 mg total) by mouth daily.  . clorazepate (TRANXENE) 3.75 MG tablet Take 3.75 mg by mouth 2 (two) times daily.   Marland Kitchen denosumab (PROLIA) 60 MG/ML SOSY injection Inject 60 mg into the skin every 6 (six) months.  . feeding supplement, ENSURE ENLIVE, (ENSURE ENLIVE) LIQD Take 237 mLs by mouth 2 (two) times daily between meals.  . Melatonin 10 MG TABS Take 1 tablet by mouth at bedtime.  . memantine (NAMENDA) 10 MG tablet Take 10 mg by mouth 2 (two) times daily.  . metFORMIN (GLUCOPHAGE) 500 MG tablet Take 1,000 mg by mouth 2 (two) times daily.   . mirtazapine (REMERON) 30 MG tablet Take 30 mg by mouth at bedtime.  . pantoprazole (PROTONIX) 40 MG tablet Take 40 mg by mouth daily.  . sodium chloride 1 G tablet Take 1  g by mouth 2 (two) times daily with a meal.    No facility-administered encounter medications on file as of 01/01/2019.    PHYSICAL EXAM:   General: NAD Neurological: Oriented to person only.  Gonzella Lex, NP-C

## 2019-03-04 ENCOUNTER — Ambulatory Visit: Payer: Medicare Other | Attending: Internal Medicine

## 2019-03-04 ENCOUNTER — Other Ambulatory Visit: Payer: Self-pay

## 2019-03-04 DIAGNOSIS — Z23 Encounter for immunization: Secondary | ICD-10-CM | POA: Insufficient documentation

## 2019-03-04 NOTE — Progress Notes (Signed)
   Covid-19 Vaccination Clinic  Name:  Autumn Schneider    MRN: [id number] DOB: 09-07-1940  03/04/2019  Ms. Cross was observed post Covid-19 immunization for 15 minutes without incidence. She was provided with Vaccine Information Sheet and instruction to access the V-Safe system.   [name] was instructed to call 911 with any severe reactions post vaccine: Marland Kitchen Difficulty breathing  . Swelling of your face and throat  . A fast heartbeat  . A bad rash all over your body  . Dizziness and weakness    Immunizations Administered    Name Date Dose VIS Date Route   Moderna COVID-19 Vaccine 03/04/2019 10:52 AM 0.5 mL 12/18/2018 Intramuscular   Manufacturer: Moderna   Lot: [id number]   NDC: 93818-299-37

## 2019-04-02 ENCOUNTER — Ambulatory Visit: Payer: Medicare Other | Attending: Internal Medicine

## 2019-04-02 ENCOUNTER — Other Ambulatory Visit: Payer: Self-pay

## 2019-04-02 ENCOUNTER — Other Ambulatory Visit: Payer: Medicare Other | Admitting: Internal Medicine

## 2019-04-02 DIAGNOSIS — Z23 Encounter for immunization: Secondary | ICD-10-CM

## 2019-04-02 NOTE — Progress Notes (Signed)
   Covid-19 Vaccination Clinic  Name:  Keionna Swendsen    MRN: [id number] DOB: [birth year]  04/02/2019  Ms. Manter was observed post Covid-19 immunization for 15 minutes without incident. She was provided with Vaccine Information Sheet and instruction to access the V-Safe system.   Ms. Koskela was instructed to call 911 with any severe reactions post vaccine: Marland Kitchen Difficulty breathing  . Swelling of face and throat  . A fast heartbeat  . A bad rash all over body  . Dizziness and weakness   Immunizations Administered    Name Date Dose VIS Date Route   Moderna COVID-19 Vaccine 04/02/2019 10:53 AM 0.5 mL 12/18/2018 Intramuscular   Manufacturer: Moderna   Lot: [id number]   NDC: 16606-004-59

## 2019-05-07 ENCOUNTER — Other Ambulatory Visit: Payer: Medicare Other | Admitting: Internal Medicine

## 2019-05-07 ENCOUNTER — Other Ambulatory Visit: Payer: Self-pay

## 2019-05-07 DIAGNOSIS — Z515 Encounter for palliative care: Secondary | ICD-10-CM

## 2019-05-07 DIAGNOSIS — F0280 Dementia in other diseases classified elsewhere without behavioral disturbance: Secondary | ICD-10-CM

## 2019-05-07 NOTE — Progress Notes (Signed)
Therapist, nutritional Palliative Care Consult Note Telephone: [phone]  Fax: [phone]  PATIENT NAME: Autumn Schneider DOB: 06-May-1940 MRN: [id number]  PRIMARY CARE PROVIDER:   Merri Brunette, MD  REFERRING PROVIDER:  Merri Brunette, MD [phone] [address] [place],  [place] [address]  RESPONSIBLE PARTY:   Windy Fast Sookram(husband)  RECOMMENDATIONS and PLAN:  Palliative Care Encounter  Z51.5  1.  Advance Care Planning: Goals remain the same with providing in home care by family and hired caregiver. Transition to Hospice care when further cognitive and functional decline occur.  Palliative care will f/u with pt in aprox 4-6 weeks.  2. 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 Using Melatonin  nightly.  Pending initiation of new sleep aid.  Monitor for additional decline and continue to provide safe environment.  Assist with all ADLs. Cueing for activities and toileting.  Due to the COVID-19 crisis, this visit was performed telephonically and was initiated and consented by patient and/or family member  I spent 15 minutes providing this consultation,  from 1515 to 1530. More than 50% of the time in this consultation was spent coordinating communication with  spouse.  HISTORY OF PRESENT ILLNESS: Follow-up with Autumn Schneider Husband denies any acute illnesses or injuries.  She continues to eat well, has gained weight but ambulates throughout the house regularly.  She is having some difficulty sleeping at night time.   She requires assistance with all ADLs, personal hygience and some assistance with meals.  Palliative Care was asked to help address goals of care.   CODE STATUS: DNAR  PPS: 50% HOSPICE ELIGIBILITY/DIAGNOSIS: TBD  PAST MEDICAL HISTORY:  Past Medical History:  Diagnosis Date  . Alzheimer disease (HCC)    diagnosed 12/12  . Anxiety and depression   . Dementia (HCC)   . Diabetes mellitus   . Esophageal reflux   . High cholesterol   .  Hypertension   . Hyponatremia   . Migraine   . Osteoporosis   . SIADH (syndrome of inappropriate ADH production) (HCC)   . TIA (transient ischemic attack)   . Vitamin D deficiency       PERTINENT MEDICATIONS:  Outpatient Encounter Medications as of 01/01/2019  Medication Sig  . ACCU-CHEK SMARTVIEW test strip   . amLODipine (NORVASC) 10 MG tablet Take 10 mg by mouth daily.  Marland Kitchen aspirin EC 81 MG tablet Take 81 mg by mouth daily.  Marland Kitchen atorvastatin (LIPITOR) 80 MG tablet Take 1 tablet (80 mg total) by mouth daily.  . Calcium Carbonate-Vitamin D (CALTRATE 600+D) 600-400 MG-UNIT per tablet Take 1 tablet by mouth daily.  . clopidogrel (PLAVIX) 75 MG tablet Take 1 tablet (75 mg total) by mouth daily.  . clorazepate (TRANXENE) 3.75 MG tablet Take 3.75 mg by mouth 2 (two) times daily.   Marland Kitchen denosumab (PROLIA) 60 MG/ML SOSY injection Inject 60 mg into the skin every 6 (six) months.  . feeding supplement, ENSURE ENLIVE, (ENSURE ENLIVE) LIQD Take 237 mLs by mouth 2 (two) times daily between meals.  . Melatonin 10 MG TABS Take 1 tablet by mouth at bedtime.  . memantine (NAMENDA) 10 MG tablet Take 10 mg by mouth 2 (two) times daily.  . metFORMIN (GLUCOPHAGE) 500 MG tablet Take 1,000 mg by mouth 2 (two) times daily.   . mirtazapine (REMERON) 30 MG tablet Take 30 mg by mouth at bedtime.  . pantoprazole (PROTONIX) 40 MG tablet Take 40 mg by mouth daily.  . sodium chloride 1 G  tablet Take 1 g by mouth 2 (two) times daily with a meal.    No facility-administered encounter medications on file as of 01/01/2019.    PHYSICAL EXAM:   General: NAD Neurological: Alert with random speech via phone.  Gonzella Lex, NP-C

## 2019-05-08 ENCOUNTER — Other Ambulatory Visit: Payer: Self-pay

## 2019-06-05 ENCOUNTER — Telehealth: Payer: Self-pay

## 2019-06-05 NOTE — Telephone Encounter (Signed)
Volunteer support call for palliative care, message left 

## 2019-08-06 ENCOUNTER — Other Ambulatory Visit: Payer: Medicare Other | Admitting: Internal Medicine

## 2019-08-06 DIAGNOSIS — Z515 Encounter for palliative care: Secondary | ICD-10-CM

## 2019-08-07 ENCOUNTER — Other Ambulatory Visit: Payer: Self-pay

## 2019-08-10 NOTE — Progress Notes (Signed)
    Therapist, nutritional Palliative Care Consult Note Telephone: [phone]  Fax: [phone]  PATIENT NAME: Autumn Schneider DOB: March 23, 1940 MRN: [id number]  PRIMARY CARE PROVIDER:   Merri Brunette, MD  REFERRING PROVIDER:  Merri Brunette, MD [phone] [address] [place],  [place] [address]  RESPONSIBLE PARTY:   Spouse  NOTE:  Phoned patient at listed telephone number for a palliative care follow-up.  Unable to leave a message for patient's husband due to a "full mailbox".  Palliative care will continue attempts to follow-up with patient with conversation with her husband.    Margaretha Sheffield, NP-C

## 2019-09-13 IMAGING — CT CT HEAD W/O CM
4 series · 15 of 47 positions shown, 17 images · non-contrast
Comparison: Brain CT 11/02/2014.

CLINICAL DATA: Patient with abnormal walking.

EXAM:
CT HEAD WITHOUT CONTRAST
TECHNIQUE: Contiguous axial images were obtained from the base of the skull
through the vertex without intravenous contrast.

[Series 3: head without · axial · non-contrast · 0.39mm/px · z∈[-101,+14]mm · 7 of 31 slices shown, 9 images]
[im 4/31  brain]
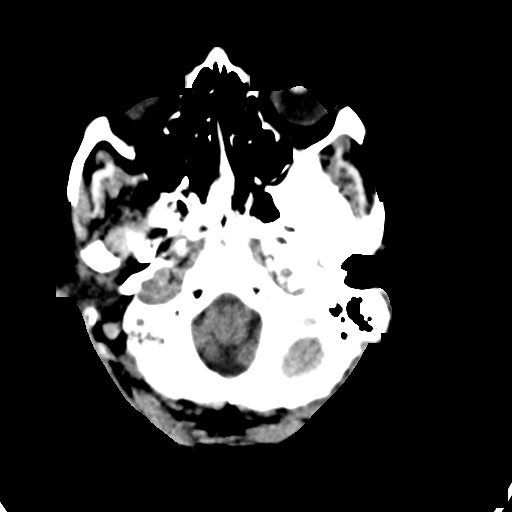
[im 4/31  bone]
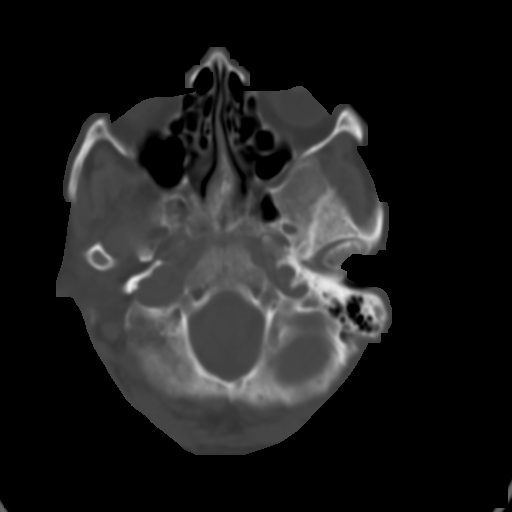
[im 8/31  brain]
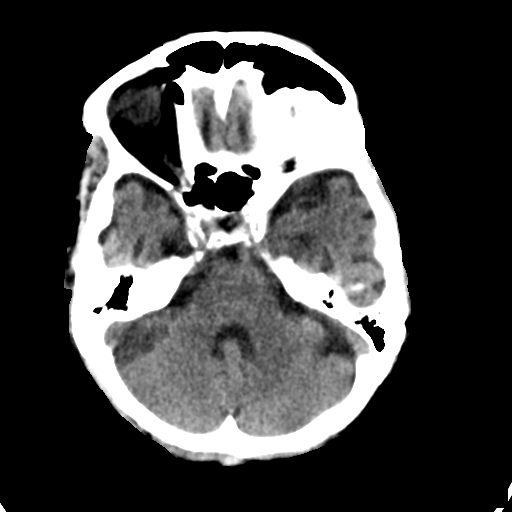
[im 12/31  brain]
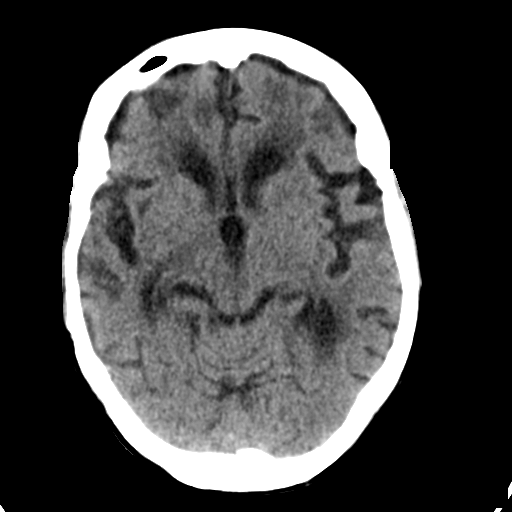
[im 16/31  brain]
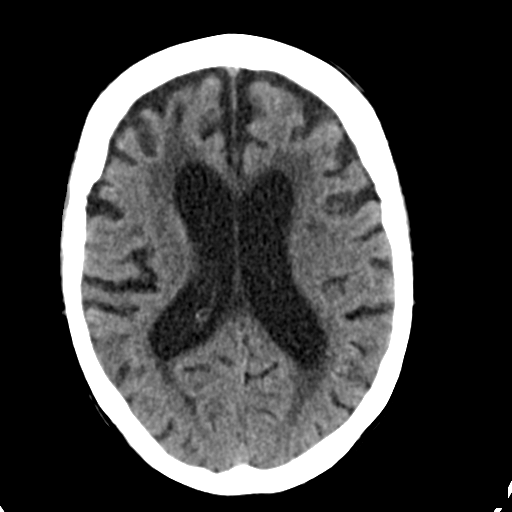
[im 19/31  brain]
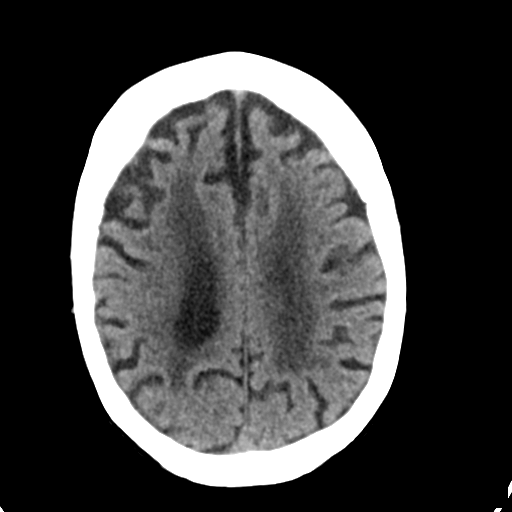
[im 19/31  bone]
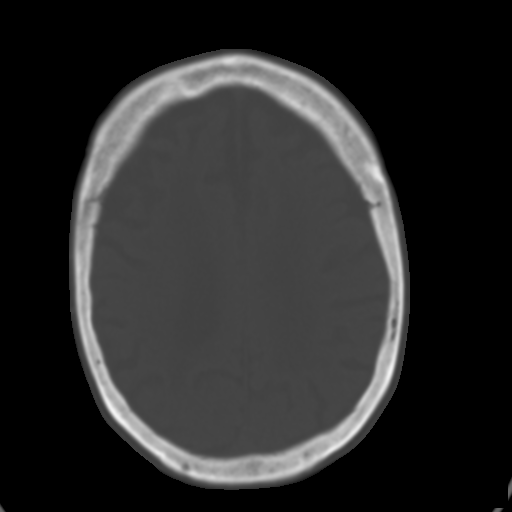
[im 23/31  brain]
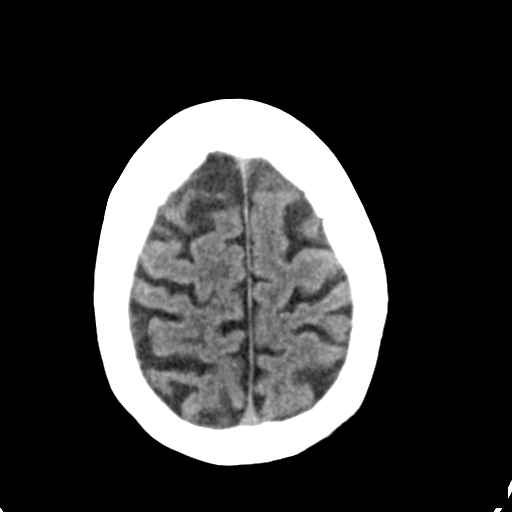
[im 27/31  brain]
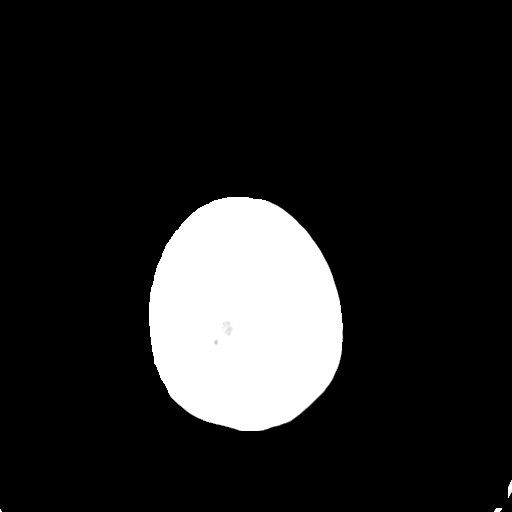

[Series 4: head bone · axial · 0.39mm/px · z∈[-102,-86]mm · 2 of 77 slices shown]
[im 8/77  bone]
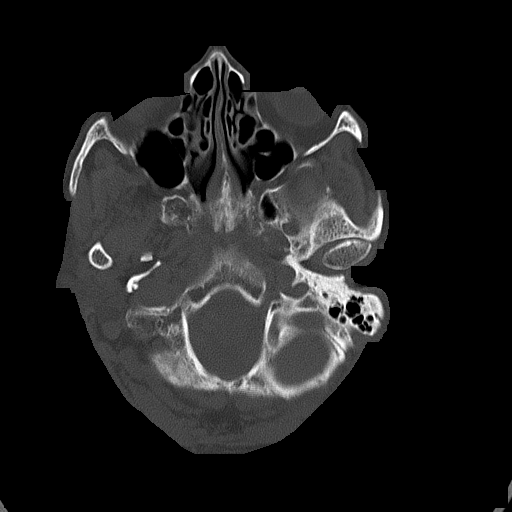
[im 16/77  bone]
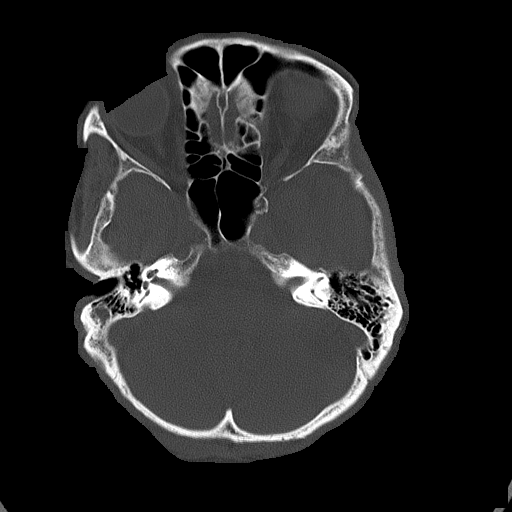

[Series 5: head without cor · coronal · non-contrast · 0.32mm/px · 3 of 70 slices shown]
[im 24/70  brain]
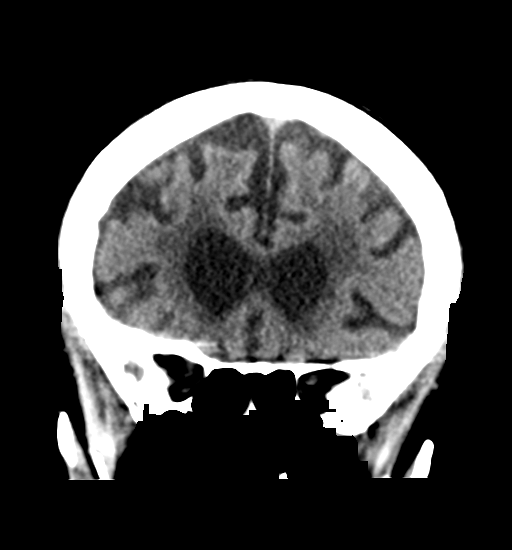
[im 31/70  brain]
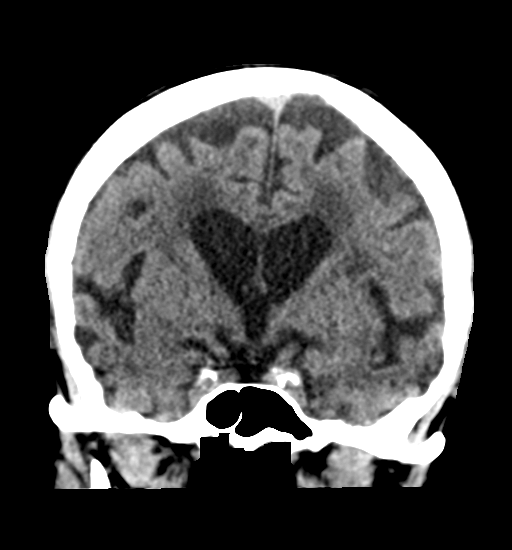
[im 39/70  brain]
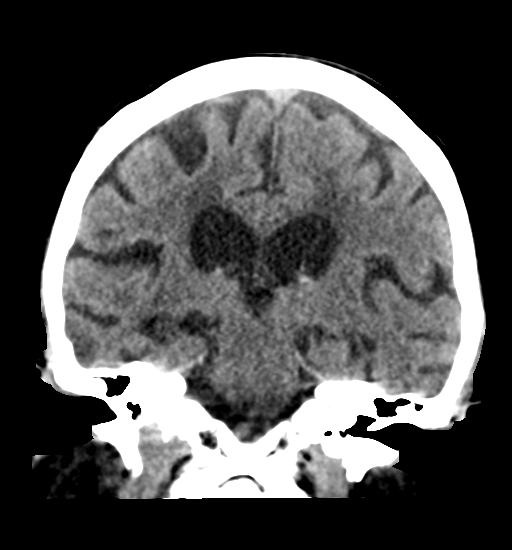

[Series 6: head without sag · sagittal · non-contrast · 0.30mm/px · 3 of 60 slices shown]
[im 20/60  brain]
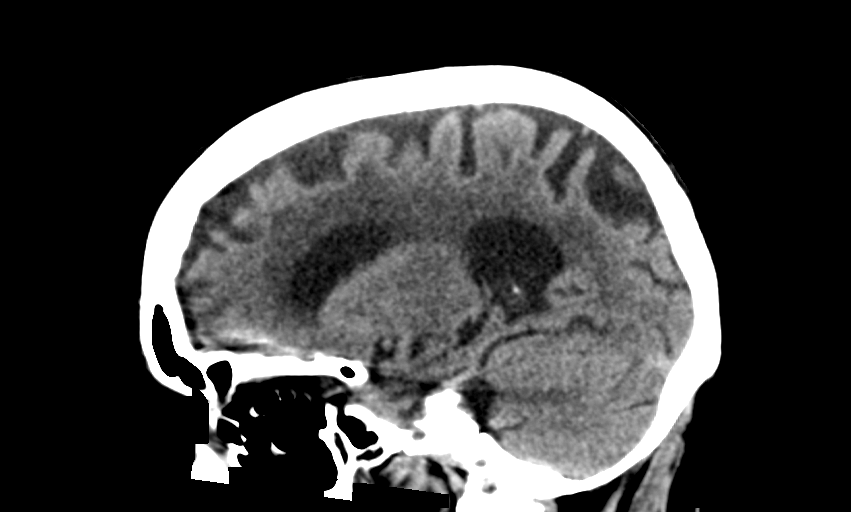
[im 30/60  brain]
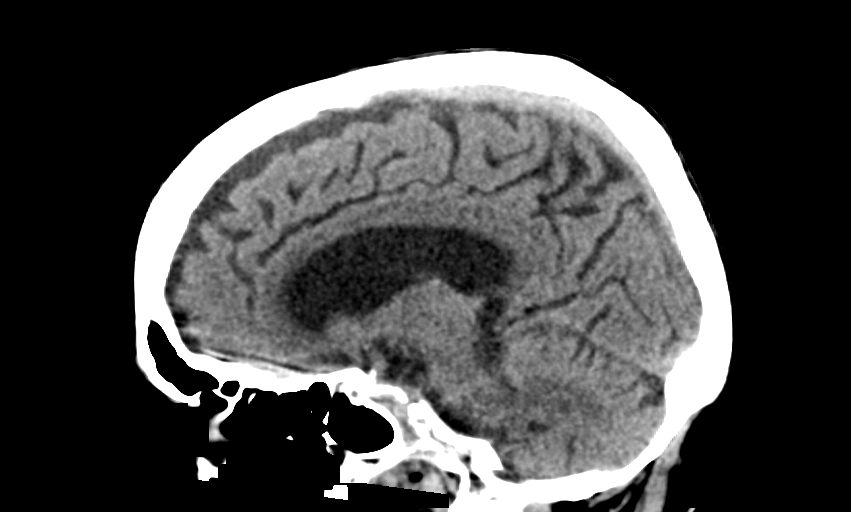
[im 40/60  brain]
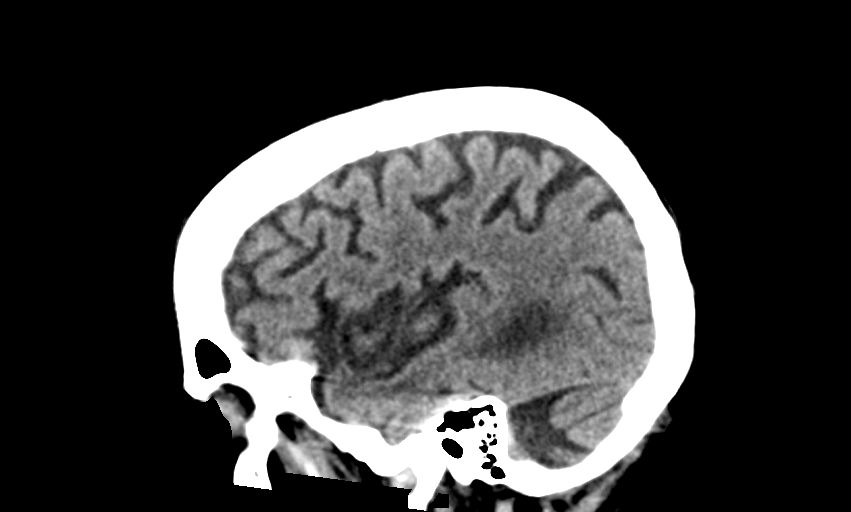

[15 of 47 positions shown; findings below may reference images not displayed]

FINDINGS: Brain: Ventricles and sulci are prominent compatible with atrophy.
Periventricular and subcortical white matter hypodensities
compatible with chronic microvascular ischemic changes. No evidence
for acute cortically based infarct, intracranial hemorrhage, mass
lesion or mass-effect.

Vascular: Unremarkable

Skull: Intact.

Sinuses/Orbits: Paranasal sinuses are well aerated. Underpneumatized
right mastoid air cells.

Other: None.
IMPRESSION: No acute intracranial process.

Atrophy and chronic microvascular ischemic changes.

## 2019-10-02 IMAGING — CR DG ANKLE COMPLETE 3+V*L*
3 series · 3 of 3 positions shown · non-contrast
Comparison: None.

CLINICAL DATA: Left ankle pain, no known injury

EXAM:
LEFT ANKLE COMPLETE - 3+ VIEW

[x ankle ap left]
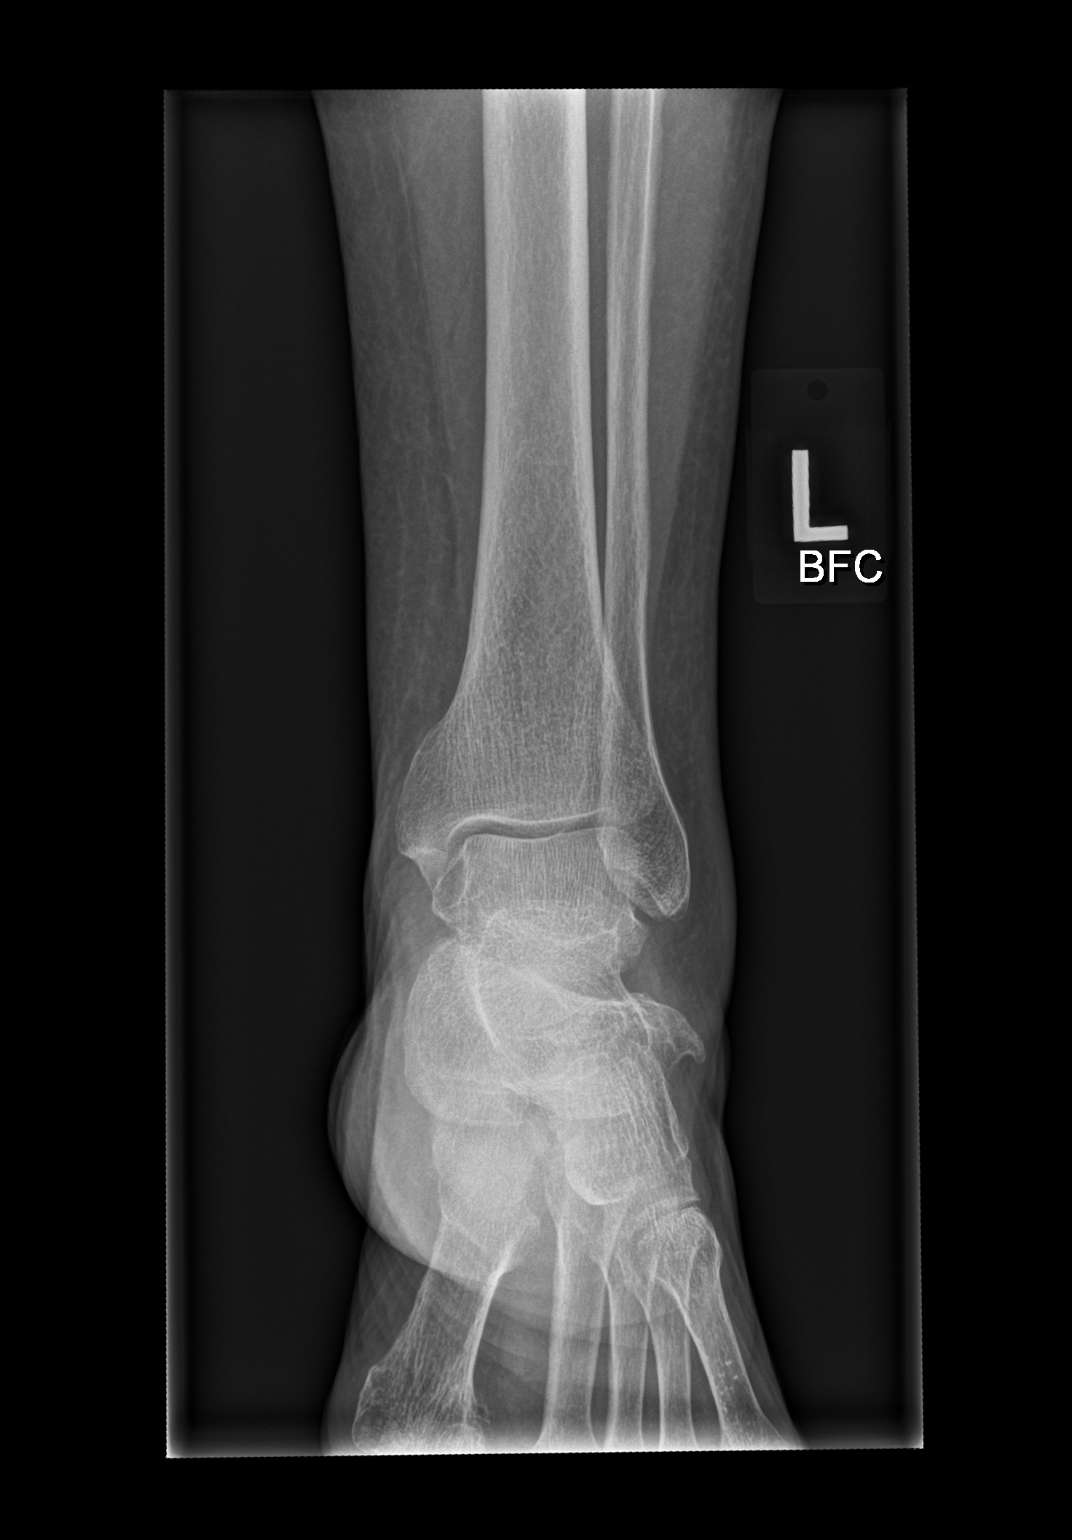

[x ankle obl left]
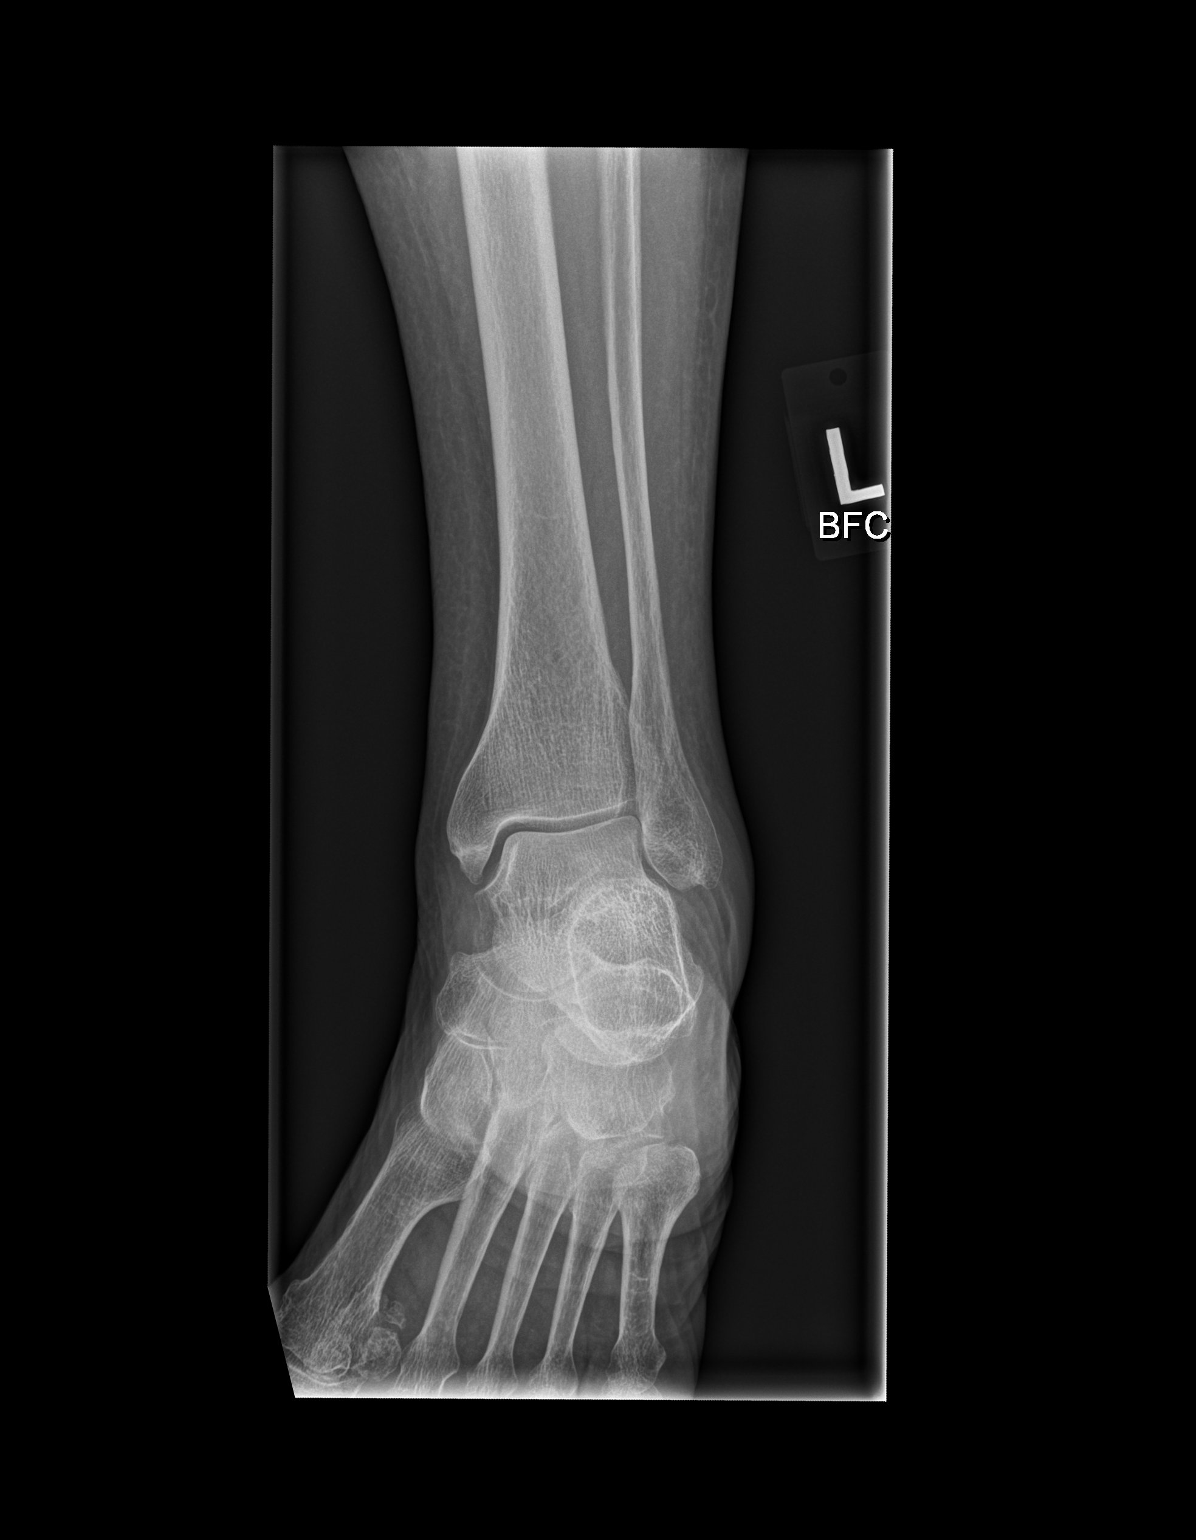

[x ankle lat left]
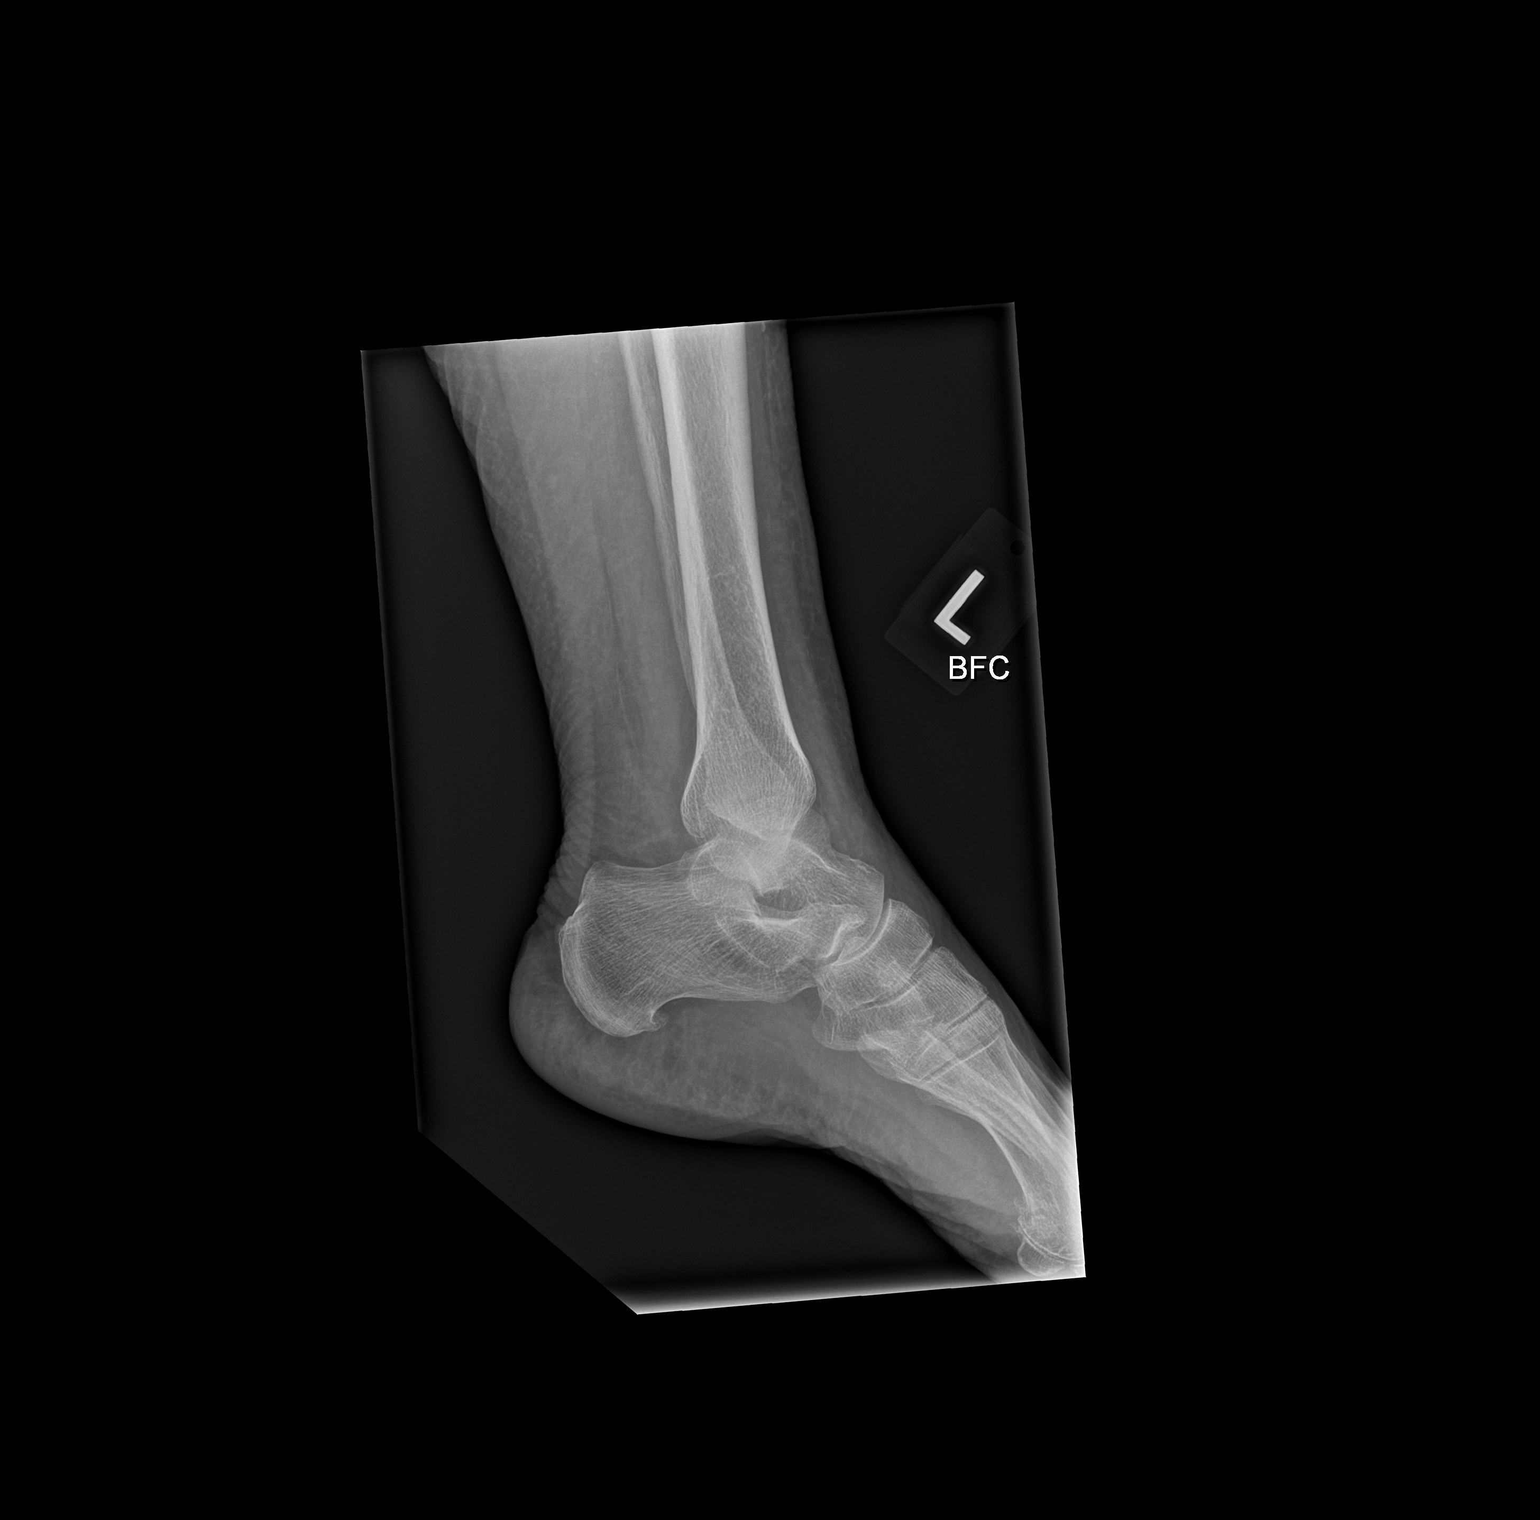

[3 of 3 positions shown; findings below may reference images not displayed]

FINDINGS: There is no evidence of fracture, dislocation, or joint effusion.
There is a focal area of osseous prominence along the lateral aspect
of the calcaneus which may reflect a small osteochondroma. There is
a small plantar calcaneal spur. Soft tissues are unremarkable.
IMPRESSION: No acute osseous injury of the left ankle.

## 2019-10-08 ENCOUNTER — Telehealth: Payer: Self-pay | Admitting: Internal Medicine

## 2019-10-08 ENCOUNTER — Other Ambulatory Visit: Payer: Medicare Other | Admitting: Internal Medicine

## 2019-10-08 DIAGNOSIS — Z515 Encounter for palliative care: Secondary | ICD-10-CM

## 2019-10-08 DIAGNOSIS — F0280 Dementia in other diseases classified elsewhere without behavioral disturbance: Secondary | ICD-10-CM

## 2019-10-09 ENCOUNTER — Other Ambulatory Visit: Payer: Self-pay

## 2019-10-10 NOTE — Progress Notes (Signed)
Therapist, nutritional Palliative Care Consult Note Telephone: [phone]  Fax: [phone]  PATIENT NAME: Autumn Schneider DOB: [birth year] MRN: [id number]  PRIMARY CARE PROVIDER:   Merri Brunette, MD  REFERRING PROVIDER:  Merri Brunette, MD [phone] Autumn [address] [place],  [place] [address]  RESPONSIBLE PARTY:   Autumn Schneider(husband)  RECOMMENDATIONS and PLAN:  Palliative Care Encounter  Z51.5  1.  Advance Care Planning: Goals are unchanged:provide in home care by family and hired caregiver. Avoid hospitalization.  Transition to Hospice care when further cognitive and functional decline occur.  Palliative care will f/u with pt in aprox 6-8 weeks.  2. Vascular dementia.  FAST stage 7b with insomnia. Use Melatonin nightly as needed.  Monitor for additional functional/ cognitive decline and continue to provide safe environment.  Assist with all ADLs. Cueing for activities and toileting.  Due to the COVID-19 crisis, this visit was performed telephonically and was initiated and consented by patient and/or family member  I spent 15 minutes providing this consultation,  from 1600 to 1615. More than 50% of the time in this consultation was spent coordinating communication with  spouse.  HISTORY OF PRESENT ILLNESS: Follow-up with Autumn Schneider Husband reports no falls or illnesses since last visit.  She continues to eat well,  weight is stable but ambulates throughout the often.  She will attempt to leave the home if unattended.  She is having some difficulty sleeping at night time.   She requires assistance with all ADLs, personal hygience and some assistance with meals.  Palliative Care was asked to help address goals of care.   CODE STATUS: DNAR  PPS: 50% HOSPICE ELIGIBILITY/DIAGNOSIS: TBD  PAST MEDICAL HISTORY:  Past Medical History:  Diagnosis Date  . Alzheimer disease (HCC)    diagnosed 12/12  . Anxiety and depression   . Dementia (HCC)   . Diabetes mellitus   .  Esophageal reflux   . High cholesterol   . Hypertension   . Hyponatremia   . Migraine   . Osteoporosis   . SIADH (syndrome of inappropriate ADH production) (HCC)   . TIA (transient ischemic attack)   . Vitamin D deficiency       PERTINENT MEDICATIONS:  Outpatient Encounter Medications as of 01/01/2019  Medication Sig  . ACCU-CHEK SMARTVIEW test strip   . amLODipine (NORVASC) 10 MG tablet Take 10 mg by mouth daily.  Marland Kitchen aspirin EC 81 MG tablet Take 81 mg by mouth daily.  Marland Kitchen atorvastatin (LIPITOR) 80 MG tablet Take 1 tablet (80 mg total) by mouth daily.  . Calcium Carbonate-Vitamin D (CALTRATE 600+D) 600-400 MG-UNIT per tablet Take 1 tablet by mouth daily.  . clopidogrel (PLAVIX) 75 MG tablet Take 1 tablet (75 mg total) by mouth daily.  . clorazepate (TRANXENE) 3.75 MG tablet Take 3.75 mg by mouth 2 (two) times daily.   Marland Kitchen denosumab (PROLIA) 60 MG/ML SOSY injection Inject 60 mg into the skin every 6 (six) months.  . feeding supplement, ENSURE ENLIVE, (ENSURE ENLIVE) LIQD Take 237 mLs by mouth 2 (two) times daily between meals.  . Melatonin 10 MG TABS Take 1 tablet by mouth at bedtime.  . memantine (NAMENDA) 10 MG tablet Take 10 mg by mouth 2 (two) times daily.  . metFORMIN (GLUCOPHAGE) 500 MG tablet Take 1,000 mg by mouth 2 (two) times daily.   . mirtazapine (REMERON) 30 MG tablet Take 30 mg by mouth at bedtime.  . pantoprazole (PROTONIX) 40 MG tablet Take 40 mg by  mouth daily.  . sodium chloride 1 G tablet Take 1 g by mouth 2 (two) times daily with a meal.    No facility-administered encounter medications on file as of 01/01/2019.    PHYSICAL EXAM:   General: NAD jper husband Neurological: Alert with random speech via phone.  Autumn Sheffield, NP-C

## 2020-04-10 ENCOUNTER — Other Ambulatory Visit: Payer: Self-pay

## 2020-04-10 ENCOUNTER — Emergency Department (HOSPITAL_COMMUNITY): Payer: Medicare Other

## 2020-04-10 ENCOUNTER — Emergency Department (HOSPITAL_COMMUNITY)
Admission: EM | Admit: 2020-04-10 | Discharge: 2020-04-10 | Disposition: A | Discharge status: Home / Self Care | Payer: Medicare Other | Attending: Emergency Medicine | Admitting: Emergency Medicine

## 2020-04-10 ENCOUNTER — Encounter (HOSPITAL_COMMUNITY): Payer: Self-pay

## 2020-04-10 DIAGNOSIS — F0390 Unspecified dementia without behavioral disturbance: Secondary | ICD-10-CM | POA: Insufficient documentation

## 2020-04-10 DIAGNOSIS — E119 Type 2 diabetes mellitus without complications: Secondary | ICD-10-CM | POA: Insufficient documentation

## 2020-04-10 DIAGNOSIS — M546 Pain in thoracic spine: Secondary | ICD-10-CM | POA: Insufficient documentation

## 2020-04-10 DIAGNOSIS — Z7984 Long term (current) use of oral hypoglycemic drugs: Secondary | ICD-10-CM | POA: Insufficient documentation

## 2020-04-10 DIAGNOSIS — Z79899 Other long term (current) drug therapy: Secondary | ICD-10-CM | POA: Insufficient documentation

## 2020-04-10 DIAGNOSIS — N390 Urinary tract infection, site not specified: Secondary | ICD-10-CM

## 2020-04-10 DIAGNOSIS — I10 Essential (primary) hypertension: Secondary | ICD-10-CM | POA: Insufficient documentation

## 2020-04-10 DIAGNOSIS — M545 Low back pain, unspecified: Secondary | ICD-10-CM | POA: Diagnosis not present

## 2020-04-10 DIAGNOSIS — S32019A Unspecified fracture of first lumbar vertebra, initial encounter for closed fracture: Secondary | ICD-10-CM

## 2020-04-10 DIAGNOSIS — R109 Unspecified abdominal pain: Secondary | ICD-10-CM

## 2020-04-10 DIAGNOSIS — R1030 Lower abdominal pain, unspecified: Secondary | ICD-10-CM | POA: Insufficient documentation

## 2020-04-10 DIAGNOSIS — Z7982 Long term (current) use of aspirin: Secondary | ICD-10-CM | POA: Diagnosis not present

## 2020-04-10 LAB — CBC
HCT: 40.4 % (ref 36.0–46.0)
Hemoglobin: 13 g/dL (ref 12.0–15.0)
MCH: 29.7 pg (ref 26.0–34.0)
MCHC: 32.2 g/dL (ref 30.0–36.0)
MCV: 92.2 fL (ref 80.0–100.0)
Platelets: 274 10*3/uL (ref 150–400)
RBC: 4.38 MIL/uL (ref 3.87–5.11)
RDW: 12.9 % (ref 11.5–15.5)
WBC: 8.6 10*3/uL (ref 4.0–10.5)
nRBC: 0 % (ref 0.0–0.2)

## 2020-04-10 LAB — URINALYSIS, ROUTINE W REFLEX MICROSCOPIC
Bilirubin Urine: NEGATIVE
Glucose, UA: 50 mg/dL — AB
Hgb urine dipstick: NEGATIVE
Ketones, ur: NEGATIVE mg/dL
Nitrite: POSITIVE — AB
Protein, ur: 30 mg/dL — AB
Specific Gravity, Urine: 1.024 (ref 1.005–1.030)
pH: 5 (ref 5.0–8.0)

## 2020-04-10 LAB — COMPREHENSIVE METABOLIC PANEL
ALT: 16 U/L (ref 0–44)
AST: 16 U/L (ref 15–41)
Albumin: 4.5 g/dL (ref 3.5–5.0)
Alkaline Phosphatase: 64 U/L (ref 38–126)
Anion gap: 8 (ref 5–15)
BUN: 32 mg/dL — ABNORMAL HIGH (ref 8–23)
CO2: 26 mmol/L (ref 22–32)
Calcium: 9.6 mg/dL (ref 8.9–10.3)
Chloride: 103 mmol/L (ref 98–111)
Creatinine, Ser: 1.17 mg/dL — ABNORMAL HIGH (ref 0.44–1.00)
GFR, Estimated: 47 mL/min — ABNORMAL LOW (ref >60)
Glucose, Bld: 191 mg/dL — ABNORMAL HIGH (ref 70–99)
Potassium: 4.6 mmol/L (ref 3.5–5.1)
Sodium: 137 mmol/L (ref 135–145)
Total Bilirubin: 1 mg/dL (ref 0.3–1.2)
Total Protein: 8.5 g/dL — ABNORMAL HIGH (ref 6.5–8.1)

## 2020-04-10 LAB — LIPASE, BLOOD: Lipase: 47 U/L (ref 11–51)

## 2020-04-10 MED ORDER — SODIUM CHLORIDE 0.9 % IV SOLN
1.0000 g | Freq: Once | INTRAVENOUS | Status: AC
  Administered 2020-04-10: 1 g via INTRAVENOUS
  Filled 2020-04-10: qty 10

## 2020-04-10 MED ORDER — IOHEXOL 300 MG/ML  SOLN
100.0000 mL | Freq: Once | INTRAMUSCULAR | Status: AC | PRN
  Administered 2020-04-10: 100 mL via INTRAVENOUS

## 2020-04-10 MED ORDER — HYDROGEN PEROXIDE 3 % EX SOLN
CUTANEOUS | Status: AC
  Filled 2020-04-10: qty 473

## 2020-04-10 MED ORDER — CEPHALEXIN 500 MG PO CAPS: 500.0000 mg | ORAL_CAPSULE | Freq: Three times a day (TID) | ORAL | 0 refills | Status: AC

## 2020-04-10 MED ORDER — SODIUM CHLORIDE 0.9 % IV BOLUS
500.0000 mL | Freq: Once | INTRAVENOUS | Status: AC
  Administered 2020-04-10: 500 mL via INTRAVENOUS

## 2020-04-10 NOTE — ED Triage Notes (Addendum)
Patient has dementia. Patient's husband reports that the patient has been saying that her "stomach hurts." Patient's husband reports that there has not been any N/v/D.  Patient's husband also reports that the patient has been bent over when walking today as if her back was hurting.

## 2020-04-10 NOTE — ED Provider Notes (Signed)
[facility]-EMERGENCY DEPT Provider Note   CSN: [id number] Arrival date & time: 04/10/20  1748     History Chief Complaint  Patient presents with  . Abdominal Pain    Autumn Schneider is a 80 y.o. female history of Alzheimer's, diabetes, reflux, hypertension, SIADH, TIA.  Patient brought in by her husband today who is primary caregiver and historian.  Patient complained of lower abdominal pain earlier today, this was new.  Patient's husband is concerned she may have a urinary tract infection.  Also report the patient had some lower back pain today and was walking his upper back was causing her pain.  Level 5 caveat dementia.  No history of fever, vomiting, diarrhea, fall/injury or any additional concerns  HPI     Past Medical History:  Diagnosis Date  . Alzheimer disease (HCC)    diagnosed 12/12  . Anxiety and depression   . Dementia (HCC)   . Diabetes mellitus   . Esophageal reflux   . High cholesterol   . Hypertension   . Hyponatremia   . Migraine   . Osteoporosis   . SIADH (syndrome of inappropriate ADH production) (HCC)   . TIA (transient ischemic attack)   . Vitamin D deficiency     Patient Active Problem List   Diagnosis Date Noted  . Palliative care encounter 05/18/2018  . Fall 11/03/2014  . Diabetes mellitus type 2, uncontrolled (HCC) 11/03/2014  . Alzheimer's dementia (HCC) 11/03/2014  . Depression 11/03/2014  . Hypercholesteremia 11/03/2014  . Elevated CK 11/03/2014  . TIA (transient ischemic attack) 03/07/2011  . Hyponatremia 03/07/2011  . EOSINOPHILIA 03/13/2008  . PRURITUS 02/21/2008  . DIABETES, TYPE 2 05/17/2007  . Hypertension 05/17/2007  . EUSTACHIAN TUBE DYSFUNCTION 05/01/2007  . LARYNGITIS, ACUTE 05/01/2007  . ALLERGIC RHINITIS 05/01/2007    Past Surgical History:  Procedure Laterality Date  . CESAREAN SECTION    . SHOULDER SURGERY  2004     OB History   No obstetric history on file.     Family History   Problem Relation Age of Onset  . Stroke Mother   . COPD Mother   . Diabetes Sister   . Breast cancer Sister   . Fibromyalgia Sister   . Stroke Maternal Grandmother   . CVA Maternal Grandmother   . Heart attack Maternal Grandfather     Social History   Tobacco Use  . Smoking status: Never Smoker  . Smokeless tobacco: Never Used  Vaping Use  . Vaping Use: Never used  Substance Use Topics  . Alcohol use: No  . Drug use: No    Home Medications Prior to Admission medications   Medication Sig Start Date End Date Taking? Authorizing Provider  ACCU-CHEK SMARTVIEW test strip  05/08/12   [provider]  amLODipine (NORVASC) 10 MG tablet Take 10 mg by mouth daily.    [provider]  aspirin EC 81 MG tablet Take 81 mg by mouth daily.    [provider]  atorvastatin (LIPITOR) 80 MG tablet Take 1 tablet (80 mg total) by mouth daily. 05/19/17 05/19/18  Roberto Scales D, MD  Calcium Carbonate-Vitamin D (CALTRATE 600+D) 600-400 MG-UNIT per tablet Take 1 tablet by mouth daily.    [provider]  clopidogrel (PLAVIX) 75 MG tablet Take 1 tablet (75 mg total) by mouth daily. 07/11/17   Ihor Austin, NP  clorazepate (TRANXENE) 3.75 MG tablet Take 3.75 mg by mouth 2 (two) times daily.  01/27/15   [provider]  denosumab (PROLIA) 60 MG/ML SOSY injection Inject 60 mg into the skin every 6 (six) months.    [provider]  feeding supplement, ENSURE ENLIVE, (ENSURE ENLIVE) LIQD Take 237 mLs by mouth 2 (two) times daily between meals. 05/19/17   Roberto Scales D, MD  Melatonin 10 MG TABS Take 1 tablet by mouth at bedtime.    [provider]  memantine (NAMENDA) 10 MG tablet Take 10 mg by mouth 2 (two) times daily.    [provider]  metFORMIN (GLUCOPHAGE) 500 MG tablet Take 1,000 mg by mouth 2 (two) times daily.  07/02/12   [provider]  mirtazapine (REMERON) 30 MG tablet Take 30 mg by mouth at bedtime. 04/18/17    [provider]  pantoprazole (PROTONIX) 40 MG tablet Take 40 mg by mouth daily.    [provider]  sodium chloride 1 G tablet Take 1 g by mouth 2 (two) times daily with a meal.     [provider]    Allergies    Aricept [donepezil hcl] and Erythromycin  Review of Systems   Review of Systems  Unable to perform ROS: Dementia    Physical Exam Updated Vital Signs BP (!) 162/75 (BP Location: Left Arm)   Pulse 68   Temp 98.7 F (37.1 C) (Oral)   Resp 19   Ht 4' 11.5" (1.511 m)   Wt 45.4 kg   SpO2 98%   BMI 19.86 kg/m   Physical Exam Constitutional:      General: She is not in acute distress.    Appearance: Normal appearance. She is well-developed. She is not ill-appearing or diaphoretic.  HENT:     Head: Normocephalic and atraumatic.  Eyes:     General: Vision grossly intact. Gaze aligned appropriately.     Pupils: Pupils are equal, round, and reactive to light.  Neck:     Trachea: Trachea and phonation normal.  Pulmonary:     Effort: Pulmonary effort is normal. No respiratory distress.  Abdominal:     General: There is no distension.     Palpations: Abdomen is soft.     Tenderness: There is abdominal tenderness in the suprapubic area. There is no guarding or rebound.  Musculoskeletal:        General: Normal range of motion.     Cervical back: Normal range of motion.     Comments: No midline C/T/L spinal tenderness to palpation, no paraspinal muscle tenderness, no deformity, crepitus, or step-off noted. No sign of injury to the neck or back.  Skin:    General: Skin is warm and dry.  Neurological:     Mental Status: She is alert.     GCS: GCS eye subscore is 4. GCS verbal subscore is 5. GCS motor subscore is 6.     Comments: Speech is clear and goal oriented, follows commands Major Cranial nerves without deficit, no facial droop Moves extremities without ataxia, coordination intact  Psychiatric:        Behavior: Behavior normal.    ED  Results / Procedures / Treatments   Labs (all labs ordered are listed, but only abnormal results are displayed) Labs Reviewed  COMPREHENSIVE METABOLIC PANEL - Abnormal; Notable for the following components:      Result Value   Glucose, Bld 191 (*)    BUN 32 (*)    Creatinine, Ser 1.17 (*)    Total Protein 8.5 (*)    GFR, Estimated 47 (*)  All other components within normal limits  LIPASE, BLOOD  CBC  URINALYSIS, ROUTINE W REFLEX MICROSCOPIC    EKG EKG Interpretation  Date/Time:  Friday April 10 2020 20:40:57 EDT Ventricular Rate:  64 PR Interval:    QRS Duration: 95 QT Interval:  402 QTC Calculation: 415 R Axis:   47 Text Interpretation: Sinus rhythm No significant change since last tracing Confirmed by Alvira Monday ([id number]) on 04/10/2020 9:43:59 PM   Radiology No results found.  Procedures Procedures   Medications Ordered in ED Medications  sodium chloride 0.9 % bolus 500 mL (has no administration in time range)    ED Course  I have reviewed the triage vital signs and the nursing notes.  Pertinent labs & imaging results that were available during my care of the patient were reviewed by me and considered in my medical decision making (see chart for details).    MDM Rules/Calculators/A&P                         Additional history obtained from: 1. Nursing notes from this visit. 2. Review of electronic medical records. 3. Patient's husband. ------------------------- I ordered, reviewed and interpreted labs which include: Urinalysis shows evidence of UTI.  Urine culture pending. Lipase within normal range. CBC within normal limits. CMP shows no emergent lecture derangement, LFT elevations or gap.  Mild elevation of creatinine and BUN similar to prior  EKG: Sinus rhythm No significant change since last tracing Confirmed by Alvira Monday ([id number]) on 04/10/2020 9:43:59 PM - Patient was given Rocephin for urinary tract infection.  Cultures pending.  CT  abdomen pelvis is pending.  Care handoff given to Dr. Dalene Seltzer at shift change.  Final disposition per oncoming team.   Note: Portions of this report may have been transcribed using voice recognition software. Every effort was made to ensure accuracy; however, inadvertent computerized transcription errors may still be present. Final Clinical Impression(s) / ED Diagnoses Final diagnoses:  None    Rx / DC Orders ED Discharge Orders    None       Elizabeth Palau 04/10/20 2148    Alvira Monday, MD 04/13/20 2315

## 2020-04-13 LAB — URINE CULTURE

## 2020-04-14 ENCOUNTER — Telehealth: Payer: Self-pay

## 2020-04-14 LAB — URINE CULTURE: Culture: >=100000 — AB

## 2020-04-14 NOTE — Telephone Encounter (Signed)
Received message to call patient's husband. Husband shared that patient appears frail and she has lost weight. Patient currently being treated for UTI. Visit scheduled with Palliative NP for Thursday 04/16/20 @ 3pm.

## 2020-04-15 ENCOUNTER — Telehealth: Payer: Self-pay | Admitting: Emergency Medicine

## 2020-04-15 NOTE — Telephone Encounter (Signed)
Post ED Visit - Positive Culture Follow-up  Culture report reviewed by antimicrobial stewardship pharmacist: [facility] [x]  , Pharm.D. []  Enzo Bi, Pharm.D., BCPS AQ-ID []  , Pharm.D., BCPS []  Celedonio Miyamoto, Pharm.D., BCPS []  [place], Garvin Fila.D., BCPS, AAHIVP []  , Pharm.D., BCPS, AAHIVP []  Georgina Pillion, PharmD, BCPS []  , PharmD, BCPS []  [place], PharmD, BCPS []  [place], PharmD []  , PharmD, BCPS []  Estella Husk, PharmD  Pharmacy Team []  Lysle Pearl, PharmD []  , PharmD []  Phillips Climes, PharmD []  , Rph []  Agapito Games) , PharmD []  Verlan Friends, PharmD []  , PharmD []  Mervyn Gay, PharmD []  , PharmD []  Vinnie Level, PharmD []  Wonda Olds, PharmD []  , PharmD []  Len Childs, PharmD   Positive urine culture Treated with cephalexin, organism sensitive to the same and no further patient follow-up is required at this time.  04/15/2020, 9:28 AM

## 2020-04-16 ENCOUNTER — Other Ambulatory Visit: Payer: Self-pay

## 2020-04-16 ENCOUNTER — Other Ambulatory Visit: Payer: Medicare Other | Admitting: Student

## 2020-04-16 DIAGNOSIS — F0280 Dementia in other diseases classified elsewhere without behavioral disturbance: Secondary | ICD-10-CM

## 2020-04-16 DIAGNOSIS — Z515 Encounter for palliative care: Secondary | ICD-10-CM

## 2020-04-16 DIAGNOSIS — N3000 Acute cystitis without hematuria: Secondary | ICD-10-CM

## 2020-04-16 NOTE — Progress Notes (Signed)
[facility] Care Consult Note Telephone: [phone]  Fax: [phone]  PATIENT NAME: Autumn Schneider [address] [place] [address] [phone] (home)  DOB: 04-04-1940 MRN: [id number]  PRIMARY CARE PROVIDER:    Carol Ada, MD,  [place] [address] [place]:   Carol Ada, [address] [place],  [place] [address] [phone]  RESPONSIBLE PARTY:   Extended Emergency Contact Information Primary Emergency Contact: Capizzi,Ronald Address: [address]          [place], [place] [address] [place] of [place] Phone: [phone] Relation: Spouse Secondary Emergency Contact: Donnell,Lillie Address: [address]           [place],  [address] [place] of [place] Phone: [phone] Relation: Sister  I met face to face with patient and family in the home.  ASSESSMENT AND RECOMMENDATIONS:   Advance Care Planning: Visit at the request of Dr. Tamala Julian for palliative consult. Visit consisted of building trust and discussions on Palliative care medicine as specialized medical care for people living with serious illness, aimed at facilitating improved quality of life through symptoms relief, assisting with advance care planning and establishing goals of care. Education provided on Palliative Medicine vs. Hospice services. Palliative care will continue to provide support to patient, family and the medical team. Goal of care: For patient to remain comfortable, symptoms managed.   Directives: DNR  Symptom Management:   Alzheimer's dementia- FAST score 7b. Patient requires frequent cueing/redirection. She ambulates ad lib about the home, monitor for falls/safety. Family to continue to assist with adl's. She does have some anxiety; continue lorazepam as directed.   Weight loss-husband reports approximate 10 pound loss in the past 6 months.  Encouraged well balanced diet with fruits/vegetables, finger foods as patient walks frequently. Recommend trying carnation instant breakfast high protein as she does not like the taste of other supplements.   Urinary tract infection-patient currently on keflex for urinary tract infection. Fluids encouraged, including cranberry juice. Continue antibiotics as directed.   Pain-patient with generalized pain; no back pain elicited upon palpation of spine. Recommend acetaminophen 1000mg  BID PRN.  Constipation-continue senna, colace as directed to promote bowel movements. Adequate fluids, well balanced diet with fiber encouraged.   Follow up Palliative Care Visit: Palliative care will continue to follow for complex decision making and symptom management. Return in 4 weeks or prn.  Family /Caregiver/Community Supports: Palliative Medicine will continue to provide support. Has private caregiver twice weekly to assist with bathing/showering.   I spent 40 minutes providing this consultation, time includes time spent with patient/family, chart review, provider coordination, and documentation. More than 50% of the time in this consultation was spent counseling and coordinating communication.   CHIEF COMPLAINT: Palliative Medicine follow up visit.   History obtained from review of EMR, discussion with primary team, and  interview with family. Records reviewed and summarized below.  HISTORY OF PRESENT ILLNESS:  Autumn Schneider is a 80 y.o. year old female with multiple medical problems including Alzheimer's disease, anxiety, depression, T2DM, GERD, hyperlipidemia, hypertension, osteoporosis, SIADH, hyponatremia, TIA, vitamin D deficiency. Palliative Care was asked to follow this patient by consultation request of Carol Ada, MD to help address advance care planning and goals of care. This is a follow up visit.   Patient resides at home with husband, grandson. She is currently on cephalexin through tomorrow for  urinary tract infection. She had been complaining of lower  back pain and "not herself."  Back pain has improved. Husband reports patient does have pain upon rising in the mornings. Husband reports an approximate 10 pound loss over the past 6 months. Husband reports constipation. She has a private caregiver 2 times a week to help with bathing/showering. She is sleeping at night. No recent falls or injury. Husband has to make sure doors are locked as patient will wander off. She ambulates/paces about the house throughout the day.   CODE STATUS: DNR  PPS: 50%  HOSPICE ELIGIBILITY/DIAGNOSIS: TBD  ROS   Unable to obtain due to dementia, impaired cognition.    Physical Exam: Pulse 77, resp 16, b/p 124/70 Constitutional: NAD General: frail appearing, thin EYES: anicteric sclera, lids intact, no discharge  ENMT: intact hearing,oral mucous membranes moist CV:  RRR, no LE edema Pulmonary: LCTA, no increased work of breathing, no cough, room air Abdomen: non-tender to palpation, BS normoactive x 4 GU: deferred MSK: sarcopenia, ambulates ad lib around the house Skin: warm and dry, no rashes or wounds on visible skin Neuro: Generalized weakness, severe cognitive impairment Psych: non-anxious affect today, pleasant mood Hem/lymph/immuno: no widespread bruising   PAST MEDICAL HISTORY:  Past Medical History:  Diagnosis Date  . Alzheimer disease ([place])    diagnosed 12/12  . Anxiety and depression   . Dementia ([place])   . Diabetes mellitus   . Esophageal reflux   . High cholesterol   . Hypertension   . Hyponatremia   . Migraine   . Osteoporosis   . SIADH (syndrome of inappropriate ADH production) ([place])   . TIA (transient ischemic attack)   . Vitamin D deficiency     SOCIAL HX:  Social History   Tobacco Use  . Smoking status: Never Smoker  . Smokeless tobacco: Never Used  Substance Use Topics  . Alcohol use: No   FAMILY HX:  Family History  Problem Relation Age of Onset  . Stroke  Mother   . COPD Mother   . Diabetes Sister   . Breast cancer Sister   . Fibromyalgia Sister   . Stroke Maternal Grandmother   . CVA Maternal Grandmother   . Heart attack Maternal Grandfather     ALLERGIES:  Allergies  Allergen Reactions  . Aricept [Donepezil Hcl]     Doesn't remember  . Erythromycin Hives     PERTINENT MEDICATIONS:  Outpatient Encounter Medications as of 04/16/2020  Medication Sig  . ACCU-CHEK SMARTVIEW test strip   . amLODipine (NORVASC) 10 MG tablet Take 10 mg by mouth daily.  Marland Kitchen aspirin EC 81 MG tablet Take 81 mg by mouth daily.  Marland Kitchen atorvastatin (LIPITOR) 80 MG tablet Take 1 tablet (80 mg total) by mouth daily.  . Calcium Carbonate-Vitamin D (CALTRATE 600+D) 600-400 MG-UNIT per tablet Take 1 tablet by mouth daily.  . cephALEXin (KEFLEX) 500 MG capsule Take 1 capsule (500 mg total) by mouth 3 (three) times daily for 7 days.  . clopidogrel (PLAVIX) 75 MG tablet Take 1 tablet (75 mg total) by mouth daily.  . clorazepate (TRANXENE) 3.75 MG tablet Take 3.75 mg by mouth 2 (two) times daily.   Marland Kitchen denosumab (PROLIA) 60 MG/ML SOSY injection Inject 60 mg into the skin every 6 (six) months.  . feeding supplement, ENSURE ENLIVE, (ENSURE ENLIVE) LIQD Take 237 mLs by mouth 2 (two) times daily between meals.  . Melatonin 10 MG TABS Take 1 tablet by mouth at bedtime.  . memantine (NAMENDA) 10 MG tablet Take 10 mg by mouth  2 (two) times daily.  . metFORMIN (GLUCOPHAGE) 500 MG tablet Take 1,000 mg by mouth 2 (two) times daily.   . mirtazapine (REMERON) 30 MG tablet Take 30 mg by mouth at bedtime.  . pantoprazole (PROTONIX) 40 MG tablet Take 40 mg by mouth daily.  . sodium chloride 1 G tablet Take 1 g by mouth 2 (two) times daily with a meal.    No facility-administered encounter medications on file as of 04/16/2020.     Thank you for the opportunity to participate in the care of Mrs. Pigeon. The palliative care team will continue to follow. Please call our office at [phone]  if we can be of additional assistance.  Ezekiel Slocumb, NP

## 2020-05-18 ENCOUNTER — Other Ambulatory Visit: Payer: Self-pay

## 2020-05-18 ENCOUNTER — Telehealth: Payer: Self-pay | Admitting: Student

## 2020-05-18 ENCOUNTER — Other Ambulatory Visit: Payer: Medicare Other | Admitting: Student

## 2020-05-18 NOTE — Telephone Encounter (Signed)
Patient had appointment with PCP scheduled the same time as Palliative f/u visit. Received return call stating patient is down to 92 pounds. Palliative appointment rescheduled for 05/26/20 at 230pm.

## 2020-05-26 ENCOUNTER — Other Ambulatory Visit: Payer: Medicare Other | Admitting: Student

## 2020-05-26 ENCOUNTER — Other Ambulatory Visit: Payer: Self-pay

## 2020-05-26 DIAGNOSIS — F419 Anxiety disorder, unspecified: Secondary | ICD-10-CM

## 2020-05-26 DIAGNOSIS — R195 Other fecal abnormalities: Secondary | ICD-10-CM

## 2020-05-26 DIAGNOSIS — Z515 Encounter for palliative care: Secondary | ICD-10-CM

## 2020-05-26 DIAGNOSIS — R634 Abnormal weight loss: Secondary | ICD-10-CM

## 2020-05-26 DIAGNOSIS — F0280 Dementia in other diseases classified elsewhere without behavioral disturbance: Secondary | ICD-10-CM

## 2020-05-26 NOTE — Progress Notes (Signed)
[facility] Consult Note Telephone: [phone]  Fax: [phone]    Date of encounter: 05/26/20 PATIENT NAME: Autumn Schneider [address] [place] [address]   [phone] (home)  DOB: [birth year] MRN: [id number] PRIMARY CARE PROVIDER:    Carol Ada, MD,  [place] [address] [place]:   Carol Ada, [place],  [place] [address] [phone]  RESPONSIBLE PARTY:    Contact Information    Name Relation Home Work Mobile   Autumn Schneider,Autumn Schneider Spouse [phone]     Autumn Schneider [phone]         I met face to face with patient and family in the home. Palliative Care was asked to follow this patient by consultation request of  Carol Ada, MD to address advance care planning and complex medical decision making. This is a follow up visit.                                   ASSESSMENT AND PLAN / RECOMMENDATIONS:   Advance Care Planning/Goals of Care: Goals include to maximize quality of life and symptom management. Our advance care planning conversation included a discussion about:     The value and importance of advance care planning   Experiences with loved ones who have been seriously ill or have died   Exploration of personal, cultural or spiritual beliefs that might influence medical decisions   Exploration of goals of care in the event of a sudden injury or illness   CODE STATUS: DNR  Discussed Palliative Medicine vs. Hospice. Reviewed criteria for Alzheimer's and abnormal weight loss/protein calorie malnutrition. Family is not ready for patient to be referred back to hospice at this time. Palliative Medicine will continue to follow and provide support.   Symptom Management/Plan:  Alzheimer's Dementia- FAST Score 7B. Patient requires frequent cueing/redirection. She ambulates ad lib about house. Monitor for  falls/safety. Anxiety secondary to Alzheimer's dementia-continue redirecting/reorientating as needed. Continue lorazepam as directed.   Loose stools-patient having one loose stool daily. Patient taking senna 2 tabs BID; recommend decrease to 1 tab BID given her hx of constipation. Notify PCP if no improvement.   Weight loss-patient weighed 92 at most recent PCP visit. Continue mirtazapine as directed. Encourage foods patient enjoys, offer hand held foods since she paces. Add nutritional supplement daily and supplement meal if patient eats less than 50% of meal. Patient eats well at breakfast and lunch. Family to feed dinner earlier to help prevent distractions. Albumin 4.1 on 05/18/20.     Follow up Palliative Care Visit: Palliative care will continue to follow for complex medical decision making, advance care planning, and clarification of goals. Return in 6 weeks or prn.   This visit was coded based on medical decision making (MDM).  PPS: 50%  HOSPICE ELIGIBILITY/DIAGNOSIS: TBD  Chief Complaint: Palliative Medicine follow up visit; Alzheimer's dementia.  HISTORY OF PRESENT ILLNESS:  Autumn Schneider is a 80 y.o. year old female  with Alzheimer's Dementia. Dx also include anxiety, depression, T2DM, GERD, hyperlipidemia, hypertension, osteoporosis, SIADH, hyponatremia, TIA, vitamin D deficiency.  Patient continues to pace about house. She is able to be redirected. Care needs are anticipated by family; caregiver twice a week to assist with bathing. She was 92 pounds at her most recent PCP visit. She is eating a good breakfast and lunch. For dinner she  does not eat as well. Family reports multiple family members at dinner table and it is for patient to focus. She is sleeping usually 12 hours at night. No falls. No urinary symptoms. Husband reports patient having one loose stool a day the past 3 weeks. No new medications; patient completed antibiotics > 1 month ago. She is receiving metformin 513m BID.  Hemoglobin A1C 7.0 on 05/18/20. No recent exposures to anyone ill.    History obtained from review of EMR, discussion with primary team, and interview with family, facility staff/caregiver and/or Autumn Schneider  I reviewed available labs, medications, imaging, studies and related documents from the EMR.  Records reviewed and summarized above.   ROS  Unable to obtain due to dementia, impaired cognition.  Physical Exam: Weight: 92 pounds 05/20/2020, 100 pounds 04/10/2020 PAINAD-0 Pulse 84, resp 16, b/p 120/60, sats 94% on room air Constitutional: NAD General: frail appearing, thin EYES: anicteric sclera, lids intact, no discharge  ENMT: intact hearing, oral mucous membranes moist CV: S1S2, RRR, no LE edema Pulmonary: LCTA, no increased work of breathing, no cough Abdomen: normo-active BS + 4 quadrants, soft and non tender GU: deferred MSK: moves all extremities, ambulatory Skin: warm and dry, no rashes or wounds on visible skin, poor turgor Neuro: no generalized weakness Psych: non-anxious affect, pleasant Hem/lymph/immuno: no widespread bruising   Thank you for the opportunity to participate in the care of Autumn Schneider  The palliative care team will continue to follow. Please call our office at [phone] we can be of additional assistance.    LCharlann Boxer NP    COVID-19 PATIENT SCREENING TOOL Asked and negative response unless otherwise noted:   Have you had symptoms of covid, tested positive or been in contact with someone with symptoms/positive test in the past 5-10 days? No

## 2020-06-20 ENCOUNTER — Encounter (HOSPITAL_COMMUNITY): Payer: Self-pay

## 2020-06-20 ENCOUNTER — Emergency Department (HOSPITAL_COMMUNITY): Payer: Medicare Other

## 2020-06-20 ENCOUNTER — Inpatient Hospital Stay (HOSPITAL_COMMUNITY)
Admission: EM | Admit: 2020-06-20 | Discharge: 2020-06-25 | DRG: 481 | Disposition: A | Discharge status: Skilled Nursing Facility | Payer: Medicare Other | Attending: Family Medicine | Admitting: Family Medicine

## 2020-06-20 DIAGNOSIS — D62 Acute posthemorrhagic anemia: Secondary | ICD-10-CM | POA: Diagnosis present

## 2020-06-20 DIAGNOSIS — Z8249 Family history of ischemic heart disease and other diseases of the circulatory system: Secondary | ICD-10-CM

## 2020-06-20 DIAGNOSIS — E559 Vitamin D deficiency, unspecified: Secondary | ICD-10-CM | POA: Diagnosis present

## 2020-06-20 DIAGNOSIS — Z7902 Long term (current) use of antithrombotics/antiplatelets: Secondary | ICD-10-CM

## 2020-06-20 DIAGNOSIS — Z20822 Contact with and (suspected) exposure to covid-19: Secondary | ICD-10-CM | POA: Diagnosis present

## 2020-06-20 DIAGNOSIS — E1122 Type 2 diabetes mellitus with diabetic chronic kidney disease: Secondary | ICD-10-CM | POA: Diagnosis present

## 2020-06-20 DIAGNOSIS — Z66 Do not resuscitate: Secondary | ICD-10-CM | POA: Diagnosis present

## 2020-06-20 DIAGNOSIS — Z823 Family history of stroke: Secondary | ICD-10-CM

## 2020-06-20 DIAGNOSIS — E7800 Pure hypercholesterolemia, unspecified: Secondary | ICD-10-CM | POA: Diagnosis present

## 2020-06-20 DIAGNOSIS — G309 Alzheimer's disease, unspecified: Secondary | ICD-10-CM | POA: Diagnosis present

## 2020-06-20 DIAGNOSIS — Z419 Encounter for procedure for purposes other than remedying health state, unspecified: Secondary | ICD-10-CM

## 2020-06-20 DIAGNOSIS — Z8781 Personal history of (healed) traumatic fracture: Secondary | ICD-10-CM

## 2020-06-20 DIAGNOSIS — S72142A Displaced intertrochanteric fracture of left femur, initial encounter for closed fracture: Secondary | ICD-10-CM | POA: Diagnosis not present

## 2020-06-20 DIAGNOSIS — I129 Hypertensive chronic kidney disease with stage 1 through stage 4 chronic kidney disease, or unspecified chronic kidney disease: Secondary | ICD-10-CM | POA: Diagnosis present

## 2020-06-20 DIAGNOSIS — Z79899 Other long term (current) drug therapy: Secondary | ICD-10-CM

## 2020-06-20 DIAGNOSIS — W010XXA Fall on same level from slipping, tripping and stumbling without subsequent striking against object, initial encounter: Secondary | ICD-10-CM | POA: Diagnosis present

## 2020-06-20 DIAGNOSIS — R64 Cachexia: Secondary | ICD-10-CM | POA: Diagnosis present

## 2020-06-20 DIAGNOSIS — D7589 Other specified diseases of blood and blood-forming organs: Secondary | ICD-10-CM | POA: Diagnosis present

## 2020-06-20 DIAGNOSIS — S72002A Fracture of unspecified part of neck of left femur, initial encounter for closed fracture: Secondary | ICD-10-CM

## 2020-06-20 DIAGNOSIS — Z7984 Long term (current) use of oral hypoglycemic drugs: Secondary | ICD-10-CM

## 2020-06-20 DIAGNOSIS — Z888 Allergy status to other drugs, medicaments and biological substances status: Secondary | ICD-10-CM

## 2020-06-20 DIAGNOSIS — K219 Gastro-esophageal reflux disease without esophagitis: Secondary | ICD-10-CM | POA: Diagnosis present

## 2020-06-20 DIAGNOSIS — Z8673 Personal history of transient ischemic attack (TIA), and cerebral infarction without residual deficits: Secondary | ICD-10-CM

## 2020-06-20 DIAGNOSIS — S72009A Fracture of unspecified part of neck of unspecified femur, initial encounter for closed fracture: Secondary | ICD-10-CM | POA: Diagnosis not present

## 2020-06-20 DIAGNOSIS — D649 Anemia, unspecified: Secondary | ICD-10-CM

## 2020-06-20 DIAGNOSIS — E119 Type 2 diabetes mellitus without complications: Secondary | ICD-10-CM

## 2020-06-20 DIAGNOSIS — Z833 Family history of diabetes mellitus: Secondary | ICD-10-CM

## 2020-06-20 DIAGNOSIS — F0280 Dementia in other diseases classified elsewhere without behavioral disturbance: Secondary | ICD-10-CM | POA: Diagnosis present

## 2020-06-20 DIAGNOSIS — E222 Syndrome of inappropriate secretion of antidiuretic hormone: Secondary | ICD-10-CM | POA: Diagnosis present

## 2020-06-20 DIAGNOSIS — M810 Age-related osteoporosis without current pathological fracture: Secondary | ICD-10-CM | POA: Diagnosis present

## 2020-06-20 DIAGNOSIS — I10 Essential (primary) hypertension: Secondary | ICD-10-CM | POA: Diagnosis present

## 2020-06-20 DIAGNOSIS — Z7982 Long term (current) use of aspirin: Secondary | ICD-10-CM

## 2020-06-20 DIAGNOSIS — Z881 Allergy status to other antibiotic agents status: Secondary | ICD-10-CM

## 2020-06-20 DIAGNOSIS — Z79890 Hormone replacement therapy (postmenopausal): Secondary | ICD-10-CM

## 2020-06-20 DIAGNOSIS — N183 Chronic kidney disease, stage 3 unspecified: Secondary | ICD-10-CM

## 2020-06-20 DIAGNOSIS — F32A Depression, unspecified: Secondary | ICD-10-CM | POA: Diagnosis present

## 2020-06-20 DIAGNOSIS — F419 Anxiety disorder, unspecified: Secondary | ICD-10-CM | POA: Diagnosis present

## 2020-06-20 DIAGNOSIS — E785 Hyperlipidemia, unspecified: Secondary | ICD-10-CM | POA: Diagnosis present

## 2020-06-20 DIAGNOSIS — N1831 Chronic kidney disease, stage 3a: Secondary | ICD-10-CM | POA: Diagnosis present

## 2020-06-20 MED ORDER — HYDROMORPHONE HCL 1 MG/ML IJ SOLN
0.5000 mg | INTRAMUSCULAR | Status: DC | PRN
Start: 2020-06-20 — End: 2020-06-21
  Administered 2020-06-20: 0.5 mg via INTRAVENOUS
  Filled 2020-06-20: qty 1

## 2020-06-20 MED ORDER — ONDANSETRON HCL 4 MG/2ML IJ SOLN
4.0000 mg | Freq: Once | INTRAMUSCULAR | Status: AC
  Administered 2020-06-20: 4 mg via INTRAVENOUS
  Filled 2020-06-20: qty 2

## 2020-06-20 MED ORDER — SODIUM CHLORIDE 0.9 % IV BOLUS
500.0000 mL | Freq: Once | INTRAVENOUS | Status: AC
  Administered 2020-06-20: 500 mL via INTRAVENOUS

## 2020-06-20 NOTE — Progress Notes (Signed)
High Left hip iintertroch fracture status post mechanical fall Patient is ambulatory Plan for medical admission with fixation tomorrow or Monday Please do not put patient on anticoagulants at this time

## 2020-06-20 NOTE — ED Triage Notes (Signed)
Pt comes from home after a mechanical fall, unwitnessed, pain to L hip, shortening and external rotation, PTA received 50 mcg fentanyl and 4 mg zofran, vomited x 3. No LOC, unknown if she hit head

## 2020-06-20 NOTE — ED Notes (Signed)
Patient transported to X-ray 

## 2020-06-20 NOTE — ED Provider Notes (Signed)
[facility] EMERGENCY DEPARTMENT Provider Note   CSN: [id number] Arrival date & time: 06/20/20  2053  LEVEL 5 CAVEAT - DEMENTIA  History Chief Complaint  Patient presents with  . Fall    Autumn Schneider is a 80 y.o. female.  HPI 80 year old female presents after a fall.  History is from EMS and the husband at the bedside.  She has had dementia for many years.  This limits the history otherwise.  However tonight she fell, which the husband did not directly witnessed but heard her cry out in pain.  Did not seem like she lost consciousness.  Did not hit her head.  She has had severe left hip pain.  EMS gave fentanyl which then caused her to vomit.  Otherwise patient has no complaints.  Past Medical History:  Diagnosis Date  . Alzheimer disease ([facility])    diagnosed 12/12  . Anxiety and depression   . Dementia ([facility])   . Diabetes mellitus   . Esophageal reflux   . High cholesterol   . Hypertension   . Hyponatremia   . Migraine   . Osteoporosis   . SIADH (syndrome of inappropriate ADH production) ([facility])   . TIA (transient ischemic attack)   . Vitamin D deficiency     Patient Active Problem List   Diagnosis Date Noted  . Palliative care encounter 05/18/2018  . Fall 11/03/2014  . Diabetes mellitus type 2, uncontrolled ([facility]) 11/03/2014  . Alzheimer's dementia ([facility]) 11/03/2014  . Depression 11/03/2014  . Hypercholesteremia 11/03/2014  . Elevated CK 11/03/2014  . TIA (transient ischemic attack) 03/07/2011  . Hyponatremia 03/07/2011  . EOSINOPHILIA 03/13/2008  . PRURITUS 02/21/2008  . DIABETES, TYPE 2 05/17/2007  . Hypertension 05/17/2007  . EUSTACHIAN TUBE DYSFUNCTION 05/01/2007  . LARYNGITIS, ACUTE 05/01/2007  . ALLERGIC RHINITIS 05/01/2007    Past Surgical History:  Procedure Laterality Date  . CESAREAN SECTION    . SHOULDER SURGERY  2004     OB History   No obstetric history on file.     Family History  Problem Relation Age of Onset  . Stroke  Mother   . COPD Mother   . Diabetes Sister   . Breast cancer Sister   . Fibromyalgia Sister   . Stroke Maternal Grandmother   . CVA Maternal Grandmother   . Heart attack Maternal Grandfather     Social History   Tobacco Use  . Smoking status: Never Smoker  . Smokeless tobacco: Never Used  Vaping Use  . Vaping Use: Never used  Substance Use Topics  . Alcohol use: No  . Drug use: No    Home Medications Prior to Admission medications   Medication Sig Start Date End Date Taking? Authorizing Provider  ACCU-CHEK SMARTVIEW test strip  05/08/12   [provider]  amLODipine (NORVASC) 10 MG tablet Take 10 mg by mouth daily.    [provider]  aspirin EC 81 MG tablet Take 81 mg by mouth daily. Patient not taking: Reported on 04/16/2020    [provider]  atorvastatin (LIPITOR) 80 MG tablet Take 1 tablet (80 mg total) by mouth daily. 05/19/17 05/19/18  Laverna Peace, MD  Calcium Carbonate-Vitamin D 600-400 MG-UNIT tablet Take 1 tablet by mouth daily. Patient not taking: Reported on 04/16/2020    [provider]  clopidogrel (PLAVIX) 75 MG tablet Take 1 tablet (75 mg total) by mouth daily. Patient not taking: Reported on 04/16/2020 07/11/17   Ihor Austin, NP  clorazepate (  TRANXENE) 3.75 MG tablet Take 3.75 mg by mouth 2 (two) times daily.  01/27/15   [provider]  denosumab (PROLIA) 60 MG/ML SOSY injection Inject 60 mg into the skin every 6 (six) months.    [provider]  feeding supplement, ENSURE ENLIVE, (ENSURE ENLIVE) LIQD Take 237 mLs by mouth 2 (two) times daily between meals. 05/19/17   Roberto Scales D, MD  hydrOXYzine (ATARAX/VISTARIL) 25 MG tablet Take 25 mg by mouth 2 (two) times daily.    [provider]  LORazepam (ATIVAN) 0.5 MG tablet Take 0.5 mg by mouth at bedtime.    [provider]  Melatonin 10 MG TABS Take 1 tablet by mouth at bedtime.    [provider]  memantine (NAMENDA) 10 MG tablet  Take 10 mg by mouth 2 (two) times daily. Patient not taking: Reported on 04/16/2020    [provider]  metFORMIN (GLUCOPHAGE) 500 MG tablet Take 1,000 mg by mouth 2 (two) times daily.  07/02/12   [provider]  mirtazapine (REMERON) 30 MG tablet Take 30 mg by mouth at bedtime. 04/18/17   [provider]  pantoprazole (PROTONIX) 40 MG tablet Take 40 mg by mouth daily.    [provider]  senna (SENOKOT) 8.6 MG TABS tablet Take 2 tablets by mouth 2 (two) times daily.    [provider]  sodium chloride 1 G tablet Take 1 g by mouth 2 (two) times daily with a meal.     [provider]    Allergies    Aricept [donepezil hcl] and Erythromycin  Review of Systems   Review of Systems  Unable to perform ROS: Dementia    Physical Exam Updated Vital Signs BP (!) 155/72   Pulse 80   Temp 98.2 F (36.8 C) (Temporal)   Resp 14   SpO2 98%   Physical Exam Vitals and nursing note reviewed.  Constitutional:      Appearance: She is well-developed. She is not ill-appearing or diaphoretic.  HENT:     Head: Normocephalic and atraumatic.     Right Ear: External ear normal.     Left Ear: External ear normal.     Nose: Nose normal.  Eyes:     General:        Right eye: No discharge.        Left eye: No discharge.  Cardiovascular:     Rate and Rhythm: Normal rate and regular rhythm.     Pulses:          Dorsalis pedis pulses are 2+ on the right side and 2+ on the left side.     Heart sounds: Normal heart sounds.  Pulmonary:     Effort: Pulmonary effort is normal.     Breath sounds: Normal breath sounds.  Abdominal:     Palpations: Abdomen is soft.     Tenderness: There is no abdominal tenderness.  Musculoskeletal:     Left hip: Tenderness present. Decreased range of motion.     Left knee: No tenderness.     Left lower leg: No tenderness.     Comments: Left leg is shortened and externally rotated  Skin:    General: Skin is warm and dry.   Neurological:     Mental Status: She is alert.  Psychiatric:        Mood and Affect: Mood is not anxious.     ED Results / Procedures / Treatments   Labs (all labs ordered are  listed, but only abnormal results are displayed) Labs Reviewed  BASIC METABOLIC PANEL  CBC WITH DIFFERENTIAL/PLATELET  PROTIME-INR  TYPE AND SCREEN    EKG EKG Interpretation  Date/Time:  Saturday Gavriella 04 2022 21:16:38 EDT Ventricular Rate:  78 PR Interval:  156 QRS Duration: 95 QT Interval:  384 QTC Calculation: 438 R Axis:   29 Text Interpretation: Sinus rhythm no significant change since mar 2022 Confirmed by Pricilla Loveless [phone]) on 06/20/2020 9:29:14 PM   Radiology DG Chest 1 View  Result Date: 06/20/2020 CLINICAL DATA:  Status post fall.  LEFT hip pain. EXAM: CHEST  1 VIEW COMPARISON:  Chest x-ray dated 11/03/2014. FINDINGS: Heart size and mediastinal contours are stable. Lungs are clear. No pleural effusion or pneumothorax is seen. Osseous structures about the chest are unremarkable. IMPRESSION: No active disease. No evidence of pneumonia or pulmonary edema. Electronically Signed   By: Bary Richard M.D.   On: 06/20/2020 22:22   DG Hip Unilat With Pelvis 2-3 Views Left  Result Date: 06/20/2020 CLINICAL DATA:  Status post mechanical fall.  Pain to LEFT hip. EXAM: DG HIP (WITH OR WITHOUT PELVIS) 2-3V LEFT COMPARISON:  None. FINDINGS: Significantly displaced/comminuted fracture of the LEFT femoral neck, with avulsion of the lesser trochanter. LEFT femoral head remains grossly well aligned relative to the acetabulum. No additional fracture is seen within the osseous pelvis. IMPRESSION: Significantly displaced/comminuted fracture of the LEFT femoral neck, with avulsion of the lesser trochanter. Electronically Signed   By: Bary Richard M.D.   On: 06/20/2020 22:22    Procedures Procedures   Medications Ordered in ED Medications  HYDROmorphone (DILAUDID) injection 0.5 mg (0.5 mg Intravenous Given  06/20/20 2111)  sodium chloride 0.9 % bolus 500 mL (has no administration in time range)  ondansetron (ZOFRAN) injection 4 mg (4 mg Intravenous Given 06/20/20 2136)    ED Course  I have reviewed the triage vital signs and the nursing notes.  Pertinent labs & imaging results that were available during my care of the patient were reviewed by me and considered in my medical decision making (see chart for details).    MDM Rules/Calculators/A&P                          Patient's x-ray confirms hip fracture.  This is a closed injury.  She is neurovascular intact.  Of note, she vomited after pain medicine here as well.  At this point, labs are currently pending but after this she will need admission to the medicine service.  I discussed with Dr. August Saucer, keep n.p.o. after midnight. Care to Dr. Renaye Rakers. Final Clinical Impression(s) / ED Diagnoses Final diagnoses:  Left displaced femoral neck fracture [facility])    Rx / DC Orders ED Discharge Orders    None       Pricilla Loveless, MD 06/20/20 2315

## 2020-06-21 ENCOUNTER — Inpatient Hospital Stay (HOSPITAL_COMMUNITY): Payer: Medicare Other | Admitting: Certified Registered"

## 2020-06-21 ENCOUNTER — Encounter (HOSPITAL_COMMUNITY): Payer: Self-pay | Admitting: Family Medicine

## 2020-06-21 ENCOUNTER — Inpatient Hospital Stay (HOSPITAL_COMMUNITY): Payer: Medicare Other

## 2020-06-21 ENCOUNTER — Encounter (HOSPITAL_COMMUNITY)
Admission: EM | Disposition: A | Discharge status: Skilled Nursing Facility | Payer: Self-pay | Source: Home / Self Care | Attending: Internal Medicine

## 2020-06-21 ENCOUNTER — Emergency Department (HOSPITAL_COMMUNITY): Payer: Medicare Other

## 2020-06-21 DIAGNOSIS — E7800 Pure hypercholesterolemia, unspecified: Secondary | ICD-10-CM | POA: Diagnosis present

## 2020-06-21 DIAGNOSIS — Z7984 Long term (current) use of oral hypoglycemic drugs: Secondary | ICD-10-CM | POA: Diagnosis not present

## 2020-06-21 DIAGNOSIS — E119 Type 2 diabetes mellitus without complications: Secondary | ICD-10-CM

## 2020-06-21 DIAGNOSIS — D7589 Other specified diseases of blood and blood-forming organs: Secondary | ICD-10-CM | POA: Diagnosis present

## 2020-06-21 DIAGNOSIS — D649 Anemia, unspecified: Secondary | ICD-10-CM

## 2020-06-21 DIAGNOSIS — Z66 Do not resuscitate: Secondary | ICD-10-CM | POA: Diagnosis present

## 2020-06-21 DIAGNOSIS — E785 Hyperlipidemia, unspecified: Secondary | ICD-10-CM | POA: Diagnosis present

## 2020-06-21 DIAGNOSIS — G309 Alzheimer's disease, unspecified: Secondary | ICD-10-CM | POA: Diagnosis present

## 2020-06-21 DIAGNOSIS — W010XXA Fall on same level from slipping, tripping and stumbling without subsequent striking against object, initial encounter: Secondary | ICD-10-CM | POA: Diagnosis present

## 2020-06-21 DIAGNOSIS — E559 Vitamin D deficiency, unspecified: Secondary | ICD-10-CM | POA: Diagnosis present

## 2020-06-21 DIAGNOSIS — I129 Hypertensive chronic kidney disease with stage 1 through stage 4 chronic kidney disease, or unspecified chronic kidney disease: Secondary | ICD-10-CM | POA: Diagnosis present

## 2020-06-21 DIAGNOSIS — K219 Gastro-esophageal reflux disease without esophagitis: Secondary | ICD-10-CM | POA: Diagnosis present

## 2020-06-21 DIAGNOSIS — Z7982 Long term (current) use of aspirin: Secondary | ICD-10-CM | POA: Diagnosis not present

## 2020-06-21 DIAGNOSIS — Z20822 Contact with and (suspected) exposure to covid-19: Secondary | ICD-10-CM | POA: Diagnosis present

## 2020-06-21 DIAGNOSIS — S72009A Fracture of unspecified part of neck of unspecified femur, initial encounter for closed fracture: Secondary | ICD-10-CM | POA: Diagnosis present

## 2020-06-21 DIAGNOSIS — N1831 Chronic kidney disease, stage 3a: Secondary | ICD-10-CM | POA: Diagnosis present

## 2020-06-21 DIAGNOSIS — S72002A Fracture of unspecified part of neck of left femur, initial encounter for closed fracture: Secondary | ICD-10-CM | POA: Diagnosis not present

## 2020-06-21 DIAGNOSIS — N183 Chronic kidney disease, stage 3 unspecified: Secondary | ICD-10-CM

## 2020-06-21 DIAGNOSIS — E222 Syndrome of inappropriate secretion of antidiuretic hormone: Secondary | ICD-10-CM | POA: Diagnosis present

## 2020-06-21 DIAGNOSIS — Z79890 Hormone replacement therapy (postmenopausal): Secondary | ICD-10-CM | POA: Diagnosis not present

## 2020-06-21 DIAGNOSIS — S72142A Displaced intertrochanteric fracture of left femur, initial encounter for closed fracture: Secondary | ICD-10-CM | POA: Diagnosis present

## 2020-06-21 DIAGNOSIS — M810 Age-related osteoporosis without current pathological fracture: Secondary | ICD-10-CM | POA: Diagnosis present

## 2020-06-21 DIAGNOSIS — Z7902 Long term (current) use of antithrombotics/antiplatelets: Secondary | ICD-10-CM | POA: Diagnosis not present

## 2020-06-21 DIAGNOSIS — D62 Acute posthemorrhagic anemia: Secondary | ICD-10-CM | POA: Diagnosis present

## 2020-06-21 DIAGNOSIS — F0280 Dementia in other diseases classified elsewhere without behavioral disturbance: Secondary | ICD-10-CM | POA: Diagnosis present

## 2020-06-21 DIAGNOSIS — R64 Cachexia: Secondary | ICD-10-CM | POA: Diagnosis present

## 2020-06-21 DIAGNOSIS — E1122 Type 2 diabetes mellitus with diabetic chronic kidney disease: Secondary | ICD-10-CM | POA: Diagnosis present

## 2020-06-21 DIAGNOSIS — F32A Depression, unspecified: Secondary | ICD-10-CM | POA: Diagnosis present

## 2020-06-21 DIAGNOSIS — Z79899 Other long term (current) drug therapy: Secondary | ICD-10-CM | POA: Diagnosis not present

## 2020-06-21 DIAGNOSIS — F419 Anxiety disorder, unspecified: Secondary | ICD-10-CM | POA: Diagnosis present

## 2020-06-21 HISTORY — PX: INTRAMEDULLARY (IM) NAIL INTERTROCHANTERIC: SHX5875

## 2020-06-21 LAB — CBC WITH DIFFERENTIAL/PLATELET
Abs Immature Granulocytes: 0.01 10*3/uL (ref 0.00–0.07)
Basophils Absolute: 0 10*3/uL (ref 0.0–0.1)
Basophils Relative: 0 %
Eosinophils Absolute: 0 10*3/uL (ref 0.0–0.5)
Eosinophils Relative: 0 %
HCT: 17.1 % — ABNORMAL LOW (ref 36.0–46.0)
Hemoglobin: 5.2 g/dL — CL (ref 12.0–15.0)
Immature Granulocytes: 0 %
Lymphocytes Relative: 7 %
Lymphs Abs: 0.4 10*3/uL — ABNORMAL LOW (ref 0.7–4.0)
MCH: 30.6 pg (ref 26.0–34.0)
MCHC: 30.4 g/dL (ref 30.0–36.0)
MCV: 100.6 fL — ABNORMAL HIGH (ref 80.0–100.0)
Monocytes Absolute: 0.3 10*3/uL (ref 0.1–1.0)
Monocytes Relative: 5 %
Neutro Abs: 5.2 10*3/uL (ref 1.7–7.7)
Neutrophils Relative %: 88 %
Platelets: 131 10*3/uL — ABNORMAL LOW (ref 150–400)
RBC: 1.7 MIL/uL — ABNORMAL LOW (ref 3.87–5.11)
RDW: 13.1 % (ref 11.5–15.5)
WBC: 5.9 10*3/uL (ref 4.0–10.5)
nRBC: 0 % (ref 0.0–0.2)

## 2020-06-21 LAB — BASIC METABOLIC PANEL WITH GFR
Anion gap: 3 — ABNORMAL LOW (ref 5–15)
BUN: 31 mg/dL — ABNORMAL HIGH (ref 8–23)
CO2: 28 mmol/L (ref 22–32)
Calcium: 9.1 mg/dL (ref 8.9–10.3)
Chloride: 107 mmol/L (ref 98–111)
Creatinine, Ser: 1.13 mg/dL — ABNORMAL HIGH (ref 0.44–1.00)
GFR, Estimated: 49 mL/min — ABNORMAL LOW
Glucose, Bld: 181 mg/dL — ABNORMAL HIGH (ref 70–99)
Potassium: 5.3 mmol/L — ABNORMAL HIGH (ref 3.5–5.1)
Sodium: 138 mmol/L (ref 135–145)

## 2020-06-21 LAB — GLUCOSE, CAPILLARY
Glucose-Capillary: 139 mg/dL — ABNORMAL HIGH (ref 70–99)
Glucose-Capillary: 147 mg/dL — ABNORMAL HIGH (ref 70–99)
Glucose-Capillary: 147 mg/dL — ABNORMAL HIGH (ref 70–99)
Glucose-Capillary: 167 mg/dL — ABNORMAL HIGH (ref 70–99)

## 2020-06-21 LAB — PREPARE RBC (CROSSMATCH)

## 2020-06-21 LAB — CBC
HCT: 38 % (ref 36.0–46.0)
Hemoglobin: 13 g/dL (ref 12.0–15.0)
MCH: 30.4 pg (ref 26.0–34.0)
MCHC: 34.2 g/dL (ref 30.0–36.0)
MCV: 89 fL (ref 80.0–100.0)
Platelets: 211 10*3/uL (ref 150–400)
RBC: 4.27 MIL/uL (ref 3.87–5.11)
RDW: 13.7 % (ref 11.5–15.5)
WBC: 9.6 10*3/uL (ref 4.0–10.5)
nRBC: 0 % (ref 0.0–0.2)

## 2020-06-21 LAB — PROTIME-INR
INR: 1 (ref 0.8–1.2)
Prothrombin Time: 13.3 s (ref 11.4–15.2)

## 2020-06-21 LAB — RESP PANEL BY RT-PCR (FLU A&B, COVID) ARPGX2
Influenza A by PCR: NEGATIVE
Influenza B by PCR: NEGATIVE
SARS Coronavirus 2 by RT PCR: NEGATIVE

## 2020-06-21 LAB — ABO/RH: ABO/RH(D): A POS

## 2020-06-21 LAB — HEMOGLOBIN AND HEMATOCRIT, BLOOD
HCT: 30.8 % — ABNORMAL LOW (ref 36.0–46.0)
Hemoglobin: 9.9 g/dL — ABNORMAL LOW (ref 12.0–15.0)

## 2020-06-21 LAB — SURGICAL PCR SCREEN
MRSA, PCR: NEGATIVE
Staphylococcus aureus: NEGATIVE

## 2020-06-21 SURGERY — FIXATION, FRACTURE, INTERTROCHANTERIC, WITH INTRAMEDULLARY ROD
Anesthesia: General | Site: Hip | Laterality: Left

## 2020-06-21 MED ORDER — MELATONIN 5 MG PO TABS
10.0000 mg | ORAL_TABLET | Freq: Every day | ORAL | Status: DC
  Administered 2020-06-22 – 2020-06-24 (×3): 10 mg via ORAL
  Filled 2020-06-21 (×4): qty 2

## 2020-06-21 MED ORDER — BUPIVACAINE HCL (PF) 0.25 % IJ SOLN
INTRAMUSCULAR | Status: DC | PRN
  Administered 2020-06-21: 15 mL

## 2020-06-21 MED ORDER — SUCCINYLCHOLINE CHLORIDE 200 MG/10ML IV SOSY
PREFILLED_SYRINGE | INTRAVENOUS | Status: AC
  Filled 2020-06-21: qty 10

## 2020-06-21 MED ORDER — MENTHOL 3 MG MT LOZG: 1.0000 | LOZENGE | OROMUCOSAL | Status: DC | PRN

## 2020-06-21 MED ORDER — FENTANYL CITRATE (PF) 100 MCG/2ML IJ SOLN: 25.0000 ug | INTRAMUSCULAR | Status: DC | PRN

## 2020-06-21 MED ORDER — METOCLOPRAMIDE HCL 5 MG/ML IJ SOLN: 5.0000 mg | Freq: Three times a day (TID) | INTRAMUSCULAR | Status: DC | PRN

## 2020-06-21 MED ORDER — ONDANSETRON HCL 4 MG/2ML IJ SOLN: 4.0000 mg | Freq: Four times a day (QID) | INTRAMUSCULAR | Status: DC | PRN

## 2020-06-21 MED ORDER — PROPOFOL 10 MG/ML IV BOLUS
INTRAVENOUS | Status: DC | PRN
  Administered 2020-06-21: 70 mg via INTRAVENOUS

## 2020-06-21 MED ORDER — LIDOCAINE 2% (20 MG/ML) 5 ML SYRINGE
INTRAMUSCULAR | Status: AC
  Filled 2020-06-21: qty 5

## 2020-06-21 MED ORDER — INSULIN ASPART 100 UNIT/ML IJ SOLN
0.0000 [IU] | Freq: Three times a day (TID) | INTRAMUSCULAR | Status: DC
  Administered 2020-06-21: 2 [IU] via SUBCUTANEOUS
  Administered 2020-06-22: 3 [IU] via SUBCUTANEOUS
  Administered 2020-06-22: 2 [IU] via SUBCUTANEOUS
  Administered 2020-06-22: 1 [IU] via SUBCUTANEOUS
  Administered 2020-06-22 – 2020-06-23 (×2): 2 [IU] via SUBCUTANEOUS
  Administered 2020-06-23: 3 [IU] via SUBCUTANEOUS
  Administered 2020-06-23 – 2020-06-24 (×3): 2 [IU] via SUBCUTANEOUS
  Administered 2020-06-24: 3 [IU] via SUBCUTANEOUS
  Administered 2020-06-24: 1 [IU] via SUBCUTANEOUS
  Administered 2020-06-24: 5 [IU] via SUBCUTANEOUS
  Administered 2020-06-25 (×2): 3 [IU] via SUBCUTANEOUS
  Administered 2020-06-25: 2 [IU] via SUBCUTANEOUS

## 2020-06-21 MED ORDER — SENNOSIDES-DOCUSATE SODIUM 8.6-50 MG PO TABS: 1.0000 | ORAL_TABLET | Freq: Every evening | ORAL | Status: DC | PRN

## 2020-06-21 MED ORDER — SODIUM CHLORIDE 0.9 % IV SOLN
10.0000 mL/h | Freq: Once | INTRAVENOUS | Status: AC
  Administered 2020-06-21: 10 mL/h via INTRAVENOUS

## 2020-06-21 MED ORDER — DEXAMETHASONE SODIUM PHOSPHATE 10 MG/ML IJ SOLN
INTRAMUSCULAR | Status: AC
  Filled 2020-06-21: qty 2

## 2020-06-21 MED ORDER — ROCURONIUM BROMIDE 10 MG/ML (PF) SYRINGE
PREFILLED_SYRINGE | INTRAVENOUS | Status: AC
  Filled 2020-06-21: qty 10

## 2020-06-21 MED ORDER — DOCUSATE SODIUM 100 MG PO CAPS
100.0000 mg | ORAL_CAPSULE | Freq: Two times a day (BID) | ORAL | Status: DC
  Administered 2020-06-22 – 2020-06-24 (×5): 100 mg via ORAL
  Filled 2020-06-21 (×7): qty 1

## 2020-06-21 MED ORDER — 0.9 % SODIUM CHLORIDE (POUR BTL) OPTIME
TOPICAL | Status: DC | PRN
  Administered 2020-06-21: 1000 mL

## 2020-06-21 MED ORDER — PROPOFOL 10 MG/ML IV BOLUS
INTRAVENOUS | Status: AC
  Filled 2020-06-21: qty 20

## 2020-06-21 MED ORDER — LACTATED RINGERS IV SOLN: INTRAVENOUS | Status: DC

## 2020-06-21 MED ORDER — LORAZEPAM 0.5 MG PO TABS
0.5000 mg | ORAL_TABLET | Freq: Every day | ORAL | Status: DC
  Administered 2020-06-22: 0.5 mg via ORAL
  Filled 2020-06-21 (×2): qty 1

## 2020-06-21 MED ORDER — FENTANYL CITRATE (PF) 100 MCG/2ML IJ SOLN
INTRAMUSCULAR | Status: DC | PRN
  Administered 2020-06-21: 100 ug via INTRAVENOUS
  Administered 2020-06-21 (×2): 50 ug via INTRAVENOUS

## 2020-06-21 MED ORDER — TRANEXAMIC ACID-NACL 1000-0.7 MG/100ML-% IV SOLN
1000.0000 mg | INTRAVENOUS | Status: AC
  Administered 2020-06-21: 1000 mg via INTRAVENOUS
  Filled 2020-06-21: qty 100

## 2020-06-21 MED ORDER — METHOCARBAMOL 1000 MG/10ML IJ SOLN
500.0000 mg | Freq: Four times a day (QID) | INTRAMUSCULAR | Status: DC | PRN
  Filled 2020-06-21: qty 5

## 2020-06-21 MED ORDER — CEFAZOLIN SODIUM-DEXTROSE 2-4 GM/100ML-% IV SOLN
2.0000 g | INTRAVENOUS | Status: AC
  Administered 2020-06-21: 2 g via INTRAVENOUS
  Filled 2020-06-21 (×2): qty 100

## 2020-06-21 MED ORDER — CEFAZOLIN SODIUM-DEXTROSE 2-4 GM/100ML-% IV SOLN
2.0000 g | Freq: Three times a day (TID) | INTRAVENOUS | Status: AC
  Administered 2020-06-21 – 2020-06-22 (×2): 2 g via INTRAVENOUS
  Filled 2020-06-21 (×2): qty 100

## 2020-06-21 MED ORDER — METFORMIN HCL 500 MG PO TABS
1000.0000 mg | ORAL_TABLET | Freq: Two times a day (BID) | ORAL | Status: DC
  Administered 2020-06-23 – 2020-06-25 (×5): 1000 mg via ORAL
  Filled 2020-06-21 (×7): qty 2

## 2020-06-21 MED ORDER — ACETAMINOPHEN 325 MG PO TABS
325.0000 mg | ORAL_TABLET | Freq: Four times a day (QID) | ORAL | Status: DC | PRN
  Administered 2020-06-24: 650 mg via ORAL
  Filled 2020-06-21 (×2): qty 2

## 2020-06-21 MED ORDER — CHLORHEXIDINE GLUCONATE 4 % EX LIQD: 60.0000 mL | Freq: Once | CUTANEOUS | Status: DC

## 2020-06-21 MED ORDER — SUGAMMADEX SODIUM 200 MG/2ML IV SOLN
INTRAVENOUS | Status: DC | PRN
  Administered 2020-06-21: 200 mg via INTRAVENOUS

## 2020-06-21 MED ORDER — MORPHINE SULFATE (PF) 4 MG/ML IV SOLN
INTRAVENOUS | Status: DC | PRN
  Administered 2020-06-21: 4 mg

## 2020-06-21 MED ORDER — ACETAMINOPHEN 10 MG/ML IV SOLN: 1000.0000 mg | Freq: Once | INTRAVENOUS | Status: DC | PRN

## 2020-06-21 MED ORDER — CHLORHEXIDINE GLUCONATE 0.12 % MT SOLN
OROMUCOSAL | Status: AC
  Administered 2020-06-21: 15 mL via OROMUCOSAL
  Filled 2020-06-21: qty 15

## 2020-06-21 MED ORDER — BUPIVACAINE HCL (PF) 0.25 % IJ SOLN
INTRAMUSCULAR | Status: AC
  Filled 2020-06-21: qty 30

## 2020-06-21 MED ORDER — CLONIDINE HCL (ANALGESIA) 100 MCG/ML EP SOLN
EPIDURAL | Status: DC | PRN
  Administered 2020-06-21: .5 mL

## 2020-06-21 MED ORDER — ORAL CARE MOUTH RINSE: 15.0000 mL | Freq: Once | OROMUCOSAL | Status: AC

## 2020-06-21 MED ORDER — ROCURONIUM BROMIDE 100 MG/10ML IV SOLN
INTRAVENOUS | Status: DC | PRN
  Administered 2020-06-21: 50 mg via INTRAVENOUS

## 2020-06-21 MED ORDER — PHENOL 1.4 % MT LIQD: 1.0000 | OROMUCOSAL | Status: DC | PRN

## 2020-06-21 MED ORDER — ONDANSETRON HCL 4 MG PO TABS: 4.0000 mg | ORAL_TABLET | Freq: Four times a day (QID) | ORAL | Status: DC | PRN

## 2020-06-21 MED ORDER — ACETAMINOPHEN 500 MG PO TABS
500.0000 mg | ORAL_TABLET | Freq: Four times a day (QID) | ORAL | Status: AC
  Administered 2020-06-22 (×3): 500 mg via ORAL
  Filled 2020-06-21 (×4): qty 1

## 2020-06-21 MED ORDER — ONDANSETRON HCL 4 MG/2ML IJ SOLN: 4.0000 mg | Freq: Once | INTRAMUSCULAR | Status: DC | PRN

## 2020-06-21 MED ORDER — POVIDONE-IODINE 10 % EX SWAB
2.0000 "application " | Freq: Once | CUTANEOUS | Status: AC
  Administered 2020-06-21: 2 via TOPICAL

## 2020-06-21 MED ORDER — METHOCARBAMOL 500 MG PO TABS: 500.0000 mg | ORAL_TABLET | Freq: Four times a day (QID) | ORAL | Status: DC | PRN

## 2020-06-21 MED ORDER — MORPHINE SULFATE (PF) 4 MG/ML IV SOLN
INTRAVENOUS | Status: AC
  Filled 2020-06-21: qty 2

## 2020-06-21 MED ORDER — MORPHINE SULFATE (PF) 4 MG/ML IV SOLN
4.0000 mg | Freq: Once | INTRAVENOUS | Status: AC
  Administered 2020-06-21: 4 mg via INTRAVENOUS
  Filled 2020-06-21: qty 1

## 2020-06-21 MED ORDER — ASPIRIN EC 81 MG PO TBEC
81.0000 mg | DELAYED_RELEASE_TABLET | Freq: Every day | ORAL | Status: DC
  Administered 2020-06-22 – 2020-06-24 (×3): 81 mg via ORAL
  Filled 2020-06-21 (×5): qty 1

## 2020-06-21 MED ORDER — HYDROCODONE-ACETAMINOPHEN 5-325 MG PO TABS: 1.0000 | ORAL_TABLET | Freq: Four times a day (QID) | ORAL | Status: DC | PRN

## 2020-06-21 MED ORDER — PHENYLEPHRINE 40 MCG/ML (10ML) SYRINGE FOR IV PUSH (FOR BLOOD PRESSURE SUPPORT)
PREFILLED_SYRINGE | INTRAVENOUS | Status: AC
  Filled 2020-06-21: qty 10

## 2020-06-21 MED ORDER — FENTANYL CITRATE (PF) 250 MCG/5ML IJ SOLN
INTRAMUSCULAR | Status: AC
  Filled 2020-06-21: qty 5

## 2020-06-21 MED ORDER — DEXAMETHASONE SODIUM PHOSPHATE 10 MG/ML IJ SOLN
INTRAMUSCULAR | Status: AC
  Filled 2020-06-21: qty 1

## 2020-06-21 MED ORDER — CLONIDINE HCL (ANALGESIA) 100 MCG/ML EP SOLN
EPIDURAL | Status: AC
  Filled 2020-06-21: qty 10

## 2020-06-21 MED ORDER — HYDROMORPHONE HCL 1 MG/ML IJ SOLN
0.5000 mg | INTRAMUSCULAR | Status: DC | PRN
Start: 2020-06-21 — End: 2020-06-26

## 2020-06-21 MED ORDER — INSULIN ASPART 100 UNIT/ML IJ SOLN: 0.0000 [IU] | Freq: Three times a day (TID) | INTRAMUSCULAR | Status: DC | PRN

## 2020-06-21 MED ORDER — AMLODIPINE BESYLATE 10 MG PO TABS
10.0000 mg | ORAL_TABLET | Freq: Every day | ORAL | Status: DC
  Administered 2020-06-22 – 2020-06-24 (×3): 10 mg via ORAL
  Filled 2020-06-21 (×5): qty 1

## 2020-06-21 MED ORDER — MORPHINE SULFATE (PF) 2 MG/ML IV SOLN: 0.5000 mg | INTRAVENOUS | Status: DC | PRN

## 2020-06-21 MED ORDER — METOCLOPRAMIDE HCL 5 MG PO TABS: 5.0000 mg | ORAL_TABLET | Freq: Three times a day (TID) | ORAL | Status: DC | PRN

## 2020-06-21 MED ORDER — CLOPIDOGREL BISULFATE 75 MG PO TABS
75.0000 mg | ORAL_TABLET | Freq: Every day | ORAL | Status: DC
  Administered 2020-06-22 – 2020-06-24 (×3): 75 mg via ORAL
  Filled 2020-06-21 (×4): qty 1

## 2020-06-21 MED ORDER — ATORVASTATIN CALCIUM 80 MG PO TABS
80.0000 mg | ORAL_TABLET | Freq: Every day | ORAL | Status: DC
  Administered 2020-06-22 – 2020-06-24 (×3): 80 mg via ORAL
  Filled 2020-06-21 (×5): qty 1

## 2020-06-21 MED ORDER — ONDANSETRON HCL 4 MG/2ML IJ SOLN
INTRAMUSCULAR | Status: AC
  Filled 2020-06-21: qty 2

## 2020-06-21 MED ORDER — IOHEXOL 300 MG/ML  SOLN
100.0000 mL | Freq: Once | INTRAMUSCULAR | Status: AC | PRN
  Administered 2020-06-21: 100 mL via INTRAVENOUS

## 2020-06-21 MED ORDER — CHLORHEXIDINE GLUCONATE 0.12 % MT SOLN
15.0000 mL | Freq: Once | OROMUCOSAL | Status: AC
Start: 2020-06-21 — End: 2020-06-21

## 2020-06-21 MED ORDER — HYDROCODONE-ACETAMINOPHEN 5-325 MG PO TABS: 1.0000 | ORAL_TABLET | ORAL | Status: DC | PRN

## 2020-06-21 MED ORDER — ONDANSETRON HCL 4 MG/2ML IJ SOLN
INTRAMUSCULAR | Status: DC | PRN
  Administered 2020-06-21: 4 mg via INTRAVENOUS

## 2020-06-21 MED ORDER — TRANEXAMIC ACID-NACL 1000-0.7 MG/100ML-% IV SOLN
1000.0000 mg | Freq: Once | INTRAVENOUS | Status: AC
  Administered 2020-06-21: 1000 mg via INTRAVENOUS
  Filled 2020-06-21: qty 100

## 2020-06-21 MED ORDER — LIDOCAINE HCL (CARDIAC) PF 100 MG/5ML IV SOSY
PREFILLED_SYRINGE | INTRAVENOUS | Status: DC | PRN
  Administered 2020-06-21: 60 mg via INTRAVENOUS

## 2020-06-21 SURGICAL SUPPLY — 54 items
BIT DRILL INTERTAN LAG SCREW (BIT) ×3 IMPLANT
BLADE CLIPPER SURG (BLADE) IMPLANT
BLADE SURG 15 STRL LF DISP TIS (BLADE) IMPLANT
BLADE SURG 15 STRL SS (BLADE)
COVER PERINEAL POST (MISCELLANEOUS) ×3 IMPLANT
COVER SURGICAL LIGHT HANDLE (MISCELLANEOUS) ×3 IMPLANT
COVER WAND RF STERILE (DRAPES) ×3 IMPLANT
DRAPE HALF SHEET 40X57 (DRAPES) IMPLANT
DRAPE INCISE IOBAN 66X45 STRL (DRAPES) ×3 IMPLANT
DRAPE ORTHO SPLIT 77X108 STRL (DRAPES)
DRAPE STERI IOBAN 125X83 (DRAPES) ×3 IMPLANT
DRAPE SURG ORHT 6 SPLT 77X108 (DRAPES) IMPLANT
DRSG AQUACEL AG 3.5X4 (GAUZE/BANDAGES/DRESSINGS) ×6 IMPLANT
DRSG AQUACEL AG ADV 3.5X 4 (GAUZE/BANDAGES/DRESSINGS) ×3 IMPLANT
DRSG MEPILEX BORDER 4X4 (GAUZE/BANDAGES/DRESSINGS) IMPLANT
DRSG MEPILEX BORDER 4X8 (GAUZE/BANDAGES/DRESSINGS) IMPLANT
DURAPREP 26ML APPLICATOR (WOUND CARE) ×3 IMPLANT
ELECT REM PT RETURN 9FT ADLT (ELECTROSURGICAL) ×3
ELECTRODE REM PT RTRN 9FT ADLT (ELECTROSURGICAL) ×1 IMPLANT
FACESHIELD WRAPAROUND (MASK) ×3 IMPLANT
GAUZE XEROFORM 5X9 LF (GAUZE/BANDAGES/DRESSINGS) IMPLANT
GLOVE ECLIPSE 8.0 STRL XLNG CF (GLOVE) ×3 IMPLANT
GLOVE SRG 8 PF TXTR STRL LF DI (GLOVE) ×1 IMPLANT
GLOVE SURG UNDER POLY LF SZ8 (GLOVE) ×3
GOWN STRL REUS W/ TWL LRG LVL3 (GOWN DISPOSABLE) ×2 IMPLANT
GOWN STRL REUS W/ TWL XL LVL3 (GOWN DISPOSABLE) IMPLANT
GOWN STRL REUS W/TWL LRG LVL3 (GOWN DISPOSABLE) ×6
GOWN STRL REUS W/TWL XL LVL3 (GOWN DISPOSABLE)
GUIDE PIN 3.2X343 (PIN) ×2
GUIDE PIN 3.2X343MM (PIN) ×6
KIT BASIN OR (CUSTOM PROCEDURE TRAY) ×3 IMPLANT
KIT TURNOVER KIT B (KITS) ×3 IMPLANT
MANIFOLD NEPTUNE II (INSTRUMENTS) IMPLANT
NAIL TRIGEN INTERTAN 10S 340MM (Orthopedic Implant) ×3 IMPLANT
NEEDLE 22X1 1/2 (OR ONLY) (NEEDLE) ×3 IMPLANT
NS IRRIG 1000ML POUR BTL (IV SOLUTION) ×3 IMPLANT
PACK GENERAL/GYN (CUSTOM PROCEDURE TRAY) ×3 IMPLANT
PAD ARMBOARD 7.5X6 YLW CONV (MISCELLANEOUS) ×6 IMPLANT
PIN GUIDE 3.2X343MM (PIN) ×2 IMPLANT
SCREW LAG COMPR KIT 80/75 (Screw) ×3 IMPLANT
SPONGE LAP 4X18 RFD (DISPOSABLE) IMPLANT
STAPLER VISISTAT 35W (STAPLE) IMPLANT
SUT ETHILON 2 0 FS 18 (SUTURE) IMPLANT
SUT ETHILON 3 0 PS 1 (SUTURE) ×9 IMPLANT
SUT VIC AB 0 CT1 27 (SUTURE) ×3
SUT VIC AB 0 CT1 27XBRD ANBCTR (SUTURE) ×1 IMPLANT
SUT VIC AB 2-0 CTB1 (SUTURE) ×6 IMPLANT
SYR 20ML LL LF (SYRINGE) ×3 IMPLANT
TAPE STRIPS DRAPE STRL (GAUZE/BANDAGES/DRESSINGS) IMPLANT
TOWEL GREEN STERILE (TOWEL DISPOSABLE) ×3 IMPLANT
TOWEL GREEN STERILE FF (TOWEL DISPOSABLE) ×3 IMPLANT
TRAY FOLEY W/BAG SLVR 16FR (SET/KITS/TRAYS/PACK) ×3
TRAY FOLEY W/BAG SLVR 16FR ST (SET/KITS/TRAYS/PACK) ×1 IMPLANT
WATER STERILE IRR 1000ML POUR (IV SOLUTION) ×3 IMPLANT

## 2020-06-21 NOTE — Anesthesia Procedure Notes (Signed)
Procedure Name: Intubation Date/Time: 06/21/2020 1:51 PM Performed by: Mayer Camel, CRNA Pre-anesthesia Checklist: Patient identified, Emergency Drugs available, Suction available and Patient being monitored Patient Re-evaluated:Patient Re-evaluated prior to induction Oxygen Delivery Method: Circle System Utilized Preoxygenation: Pre-oxygenation with 100% oxygen Induction Type: IV induction Ventilation: Mask ventilation without difficulty Laryngoscope Size: Miller and 2 Grade View: Grade I Tube type: Oral Tube size: 6.5 mm Number of attempts: 1 Airway Equipment and Method: Stylet Placement Confirmation: ETT inserted through vocal cords under direct vision,  positive ETCO2 and breath sounds checked- equal and bilateral Secured at: 20 cm Tube secured with: Tape Dental Injury: Teeth and Oropharynx as per pre-operative assessment

## 2020-06-21 NOTE — TOC CAGE-AID Note (Signed)
Transition of Care [facility]) - CAGE-AID Screening   Patient Details  Name: Autumn Schneider MRN: [id number] Date of Birth: 11/15/1940   Hewitt Shorts, RN  Trauma Response Nurse Phone Number: [phone] 06/21/2020, 5:56 PM       CAGE-AID Screening: Substance Abuse Screening unable to be completed due to: : Patient unable to participate (Hx alzheimer'sand dementia baseline- oriented to person)

## 2020-06-21 NOTE — Op Note (Signed)
[name], Autumn Schneider MEDICAL RECORD NO: [id number] ACCOUNT NO: [id number] DATE OF BIRTH: 1940/07/03 FACILITY: [facility] LOCATION: [facility] PHYSICIAN: Graylin Shiver. August Saucer, MD  Operative Report   DATE OF PROCEDURE: 06/21/2020  PREOPERATIVE DIAGNOSIS:  Left hip intertrochanteric fracture.  POSTOPERATIVE DIAGNOSIS:  Left hip intertrochanteric fracture.  PROCEDURE:  Left hip intertrochanteric fracture open reduction internal fixation using Smith and Nephew IMHS 34 cm x 10 with one compression screw and one lag screw was inserted.  ATTENDING:  Graylin Shiver. August Saucer, MD  ASSISTANT:  Karenann Cai, PA  INDICATIONS:  The patient is a 80 year old patient with left hip fracture who presents for operative management after explanation of risks and benefits.  DESCRIPTION OF PROCEDURE:  Patient was brought to the operating room where general anesthetic was induced.  Preoperative antibiotics were administered.  Timeout was called.  The patient was placed on the fracture table with the right leg in lithotomy  position with peroneal nerve well padded.  Left leg was placed under some traction and internal rotation.  Under fluoroscopic guidance, the fracture was reduced.  Timeout was called.  The area was prescrubbed with alcohol and Betadine and allowed to air  dry.  Prepped with DuraPrep solution and draped in a sterile manner.  Guide pin placed through the tip of the trochanter into the proximal femur.  Correct location was confirmed under fluoroscopic guidance.  Proximal reaming performed.  In accordance  with preoperative templating 10 x 34 nail was placed.  The compression and lag screw were also placed and we achieved excellent compression by releasing the traction and using the compression screw portion of the construct.  The 2 screws were placed in  the bottom half of the femoral neck for optimal bone quality.  We achieved about 7 mm of compression.  Stable construct was achieved.  Thorough irrigation was performed on  both incisions.  One was the introductory incision.  The second was the incision  for the lag screw and compression screw.  Both incisions were irrigated and anesthetized using Marcaine, morphine and clonidine.  Thorough irrigation was performed and they were closed using #1 Vicryl, 2-0 Vicryl and 3-0 nylon.  Aquacel dressing was  placed.  The patient tolerated the procedure well without immediate complications and was transferred to the recovery room in stable condition.  Luke's assistance was required at all times during the case for retraction, opening and closing, mobilization  of tissue.  His assistance was a medical necessity.   PUS D: 06/21/2020 3:01:47 pm T: 06/21/2020 3:54:00 pm  JOB: [id number]/ [id number]

## 2020-06-21 NOTE — ED Notes (Signed)
2nd unit PRBC started at 120 ml/hr.

## 2020-06-21 NOTE — ED Notes (Signed)
1st unit PRBC infusing at 120 ml/hr.  

## 2020-06-21 NOTE — Consult Note (Signed)
Reason for Consult: Left hip fracture  Referring Physician: Dr. Natale MilchLancaster  Autumn Schneider is an 80 y.o. female.  HPI: Autumn Schneider is an ambulatory 80 year old female with left hip pain.  She lives at home.  Has a husband at home.  Here with her son this morning.  Had mechanical fall yesterday and sustained left hip fracture.  Patient does have dementia.  Plain radiographs from the emergency department demonstrate high intertrochanteric fracture with some displacement.  Patient denies any other orthopedic complaints  Past Medical History:  Diagnosis Date  . Alzheimer disease (HCC)    diagnosed 12/12  . Anxiety and depression   . Dementia (HCC)   . Diabetes mellitus   . Esophageal reflux   . High cholesterol   . Hypertension   . Hyponatremia   . Migraine   . Osteoporosis   . SIADH (syndrome of inappropriate ADH production) (HCC)   . TIA (transient ischemic attack)   . Vitamin D deficiency     Past Surgical History:  Procedure Laterality Date  . CESAREAN SECTION    . SHOULDER SURGERY  2004    Family History  Problem Relation Age of Onset  . Stroke Mother   . COPD Mother   . Diabetes Sister   . Breast cancer Sister   . Fibromyalgia Sister   . Stroke Maternal Grandmother   . CVA Maternal Grandmother   . Heart attack Maternal Grandfather     Social History:  reports that she has never smoked. She has never used smokeless tobacco. She reports that she does not drink alcohol and does not use drugs.  Allergies:  Allergies  Allergen Reactions  . Aricept [Donepezil Hcl]     Doesn't remember  . Erythromycin Hives    Medications: I have reviewed the patient's current medications.  Results for orders placed or performed during the hospital encounter of 06/20/20 (from the past 48 hour(s))  Resp Panel by RT-PCR (Flu A&B, Covid) Nasopharyngeal Swab     Status: None   Collection Time: 06/21/20 12:10 AM   Specimen: Nasopharyngeal Swab; Nasopharyngeal(NP) swabs in vial transport medium   Result Value Ref Range   SARS Coronavirus 2 by RT PCR NEGATIVE NEGATIVE    Comment: (NOTE) SARS-CoV-2 target nucleic acids are NOT DETECTED.  The SARS-CoV-2 RNA is generally detectable in upper respiratory specimens during the acute phase of infection. The lowest concentration of SARS-CoV-2 viral copies this assay can detect is 138 copies/mL. A negative result does not preclude SARS-Cov-2 infection and should not be used as the sole basis for treatment or other patient management decisions. A negative result may occur with  improper specimen collection/handling, submission of specimen other than nasopharyngeal swab, presence of viral mutation(s) within the areas targeted by this assay, and inadequate number of viral copies(<138 copies/mL). A negative result must be combined with clinical observations, patient history, and epidemiological information. The expected result is Negative.  Fact Sheet for Patients:  BloggerCourse.comhttps://www.fda.gov/media/152166/download  Fact Sheet for Healthcare Providers:  SeriousBroker.ithttps://www.fda.gov/media/152162/download  This test is no t yet approved or cleared by the [place] FDA and  has been authorized for detection and/or diagnosis of SARS-CoV-2 by FDA under an Emergency Use Authorization (EUA). This EUA will remain  in effect (meaning this test can be used) for the duration of the COVID-19 declaration under Section 564(b)(1) of the Act, 21 U.S.C.section 360bbb-3(b)(1), unless the authorization is terminated  or revoked sooner.       Influenza A by PCR NEGATIVE NEGATIVE   Influenza  B by PCR NEGATIVE NEGATIVE    Comment: (NOTE) The Xpert Xpress SARS-CoV-2/FLU/RSV plus assay is intended as an aid in the diagnosis of influenza from Nasopharyngeal swab specimens and should not be used as a sole basis for treatment. Nasal washings and aspirates are unacceptable for Xpert Xpress SARS-CoV-2/FLU/RSV testing.  Fact Sheet for  Patients: BloggerCourse.com  Fact Sheet for Healthcare Providers: SeriousBroker.it  This test is not yet approved or cleared by the [place] FDA and has been authorized for detection and/or diagnosis of SARS-CoV-2 by FDA under an Emergency Use Authorization (EUA). This EUA will remain in effect (meaning this test can be used) for the duration of the COVID-19 declaration under Section 564(b)(1) of the Act, 21 U.S.C. section 360bbb-3(b)(1), unless the authorization is terminated or revoked.  Performed at [facility], [address] [place], [place] [address]   Type and screen [facility]     Status: None (Preliminary result)   Collection Time: 06/21/20 12:11 AM  Result Value Ref Range   ABO/RH(D) A POS    Antibody Screen NEG    Sample Expiration 06/24/2020,2359    Unit Number [id number]    Blood Component Type RED CELLS,LR    Unit division 00    Status of Unit ISSUED    Transfusion Status OK TO TRANSFUSE    Crossmatch Result      Compatible Performed at [facility], [address] [place], [place] [address]    Unit Number [id number]    Blood Component Type RBC LR PHER1    Unit division 00    Status of Unit ISSUED    Transfusion Status OK TO TRANSFUSE    Crossmatch Result Compatible   CBC WITH DIFFERENTIAL     Status: Abnormal   Collection Time: 06/21/20 12:20 AM  Result Value Ref Range   WBC 5.9 4.0 - 10.5 K/uL   RBC 1.70 (L) 3.87 - 5.11 MIL/uL   Hemoglobin 5.2 (LL) 12.0 - 15.0 g/dL    Comment: REPEATED TO VERIFY THIS CRITICAL RESULT HAS VERIFIED AND BEEN CALLED TO B.SANGALANG,RN BY MELISSA BROGDON ON 06 05 2022 AT 0035, AND HAS BEEN READ BACK.     HCT 17.1 (L) 36.0 - 46.0 %   MCV 100.6 (H) 80.0 - 100.0 fL   MCH 30.6 26.0 - 34.0 pg   MCHC 30.4 30.0 - 36.0 g/dL   RDW 85.6 31.4 - 97.0 %   Platelets 131 (L) 150 - 400 K/uL   nRBC 0.0 0.0 - 0.2 %   Neutrophils Relative % 88 %    Neutro Abs 5.2 1.7 - 7.7 K/uL   Lymphocytes Relative 7 %   Lymphs Abs 0.4 (L) 0.7 - 4.0 K/uL   Monocytes Relative 5 %   Monocytes Absolute 0.3 0.1 - 1.0 K/uL   Eosinophils Relative 0 %   Eosinophils Absolute 0.0 0.0 - 0.5 K/uL   Basophils Relative 0 %   Basophils Absolute 0.0 0.0 - 0.1 K/uL   Immature Granulocytes 0 %   Abs Immature Granulocytes 0.01 0.00 - 0.07 K/uL    Comment: Performed at [facility], [address] [place], [place] [address]  Protime-INR     Status: None   Collection Time: 06/21/20 12:20 AM  Result Value Ref Range   Prothrombin Time 13.3 11.4 - 15.2 seconds   INR 1.0 0.8 - 1.2    Comment: (NOTE) INR goal varies based on device and disease states. Performed at [facility]  [facility], [address] [place], [place] [address]   Prepare RBC (crossmatch)     Status: None   Collection Time: 06/21/20  1:07 AM  Result Value Ref Range   Order Confirmation      ORDER PROCESSED BY BLOOD BANK Performed at [facility], [address], [place], [place] [address]   Basic metabolic panel     Status: Abnormal   Collection Time: 06/21/20  1:18 AM  Result Value Ref Range   Sodium 138 135 - 145 mmol/L   Potassium 5.3 (H) 3.5 - 5.1 mmol/L   Chloride 107 98 - 111 mmol/L   CO2 28 22 - 32 mmol/L   Glucose, Bld 181 (H) 70 - 99 mg/dL    Comment: Glucose reference range applies only to samples taken after fasting for at least 8 hours.   BUN 31 (H) 8 - 23 mg/dL   Creatinine, Ser 2.95 (H) 0.44 - 1.00 mg/dL   Calcium 9.1 8.9 - 62.1 mg/dL   GFR, Estimated 49 (L) >60 mL/min    Comment: (NOTE) Calculated using the CKD-EPI Creatinine Equation (2021)    Anion gap 3 (L) 5 - 15    Comment: Performed at [facility], [address] [place], [place] [address]  Hemoglobin and hematocrit, blood     Status: Abnormal   Collection Time: 06/21/20  1:18 AM  Result Value Ref Range   Hemoglobin 9.9 (L) 12.0 - 15.0 g/dL    Comment: REPEATED TO VERIFY   HCT 30.8 (L) 36.0 -  46.0 %    Comment: Performed at [facility], [address], [place], [place] [address]  ABO/Rh     Status: None   Collection Time: 06/21/20  1:20 AM  Result Value Ref Range   ABO/RH(D)      A POS Performed at [facility], [address] [place], [place] [address]   Surgical pcr screen     Status: None   Collection Time: 06/21/20  6:04 AM   Specimen: Nasal Mucosa; Nasal Swab  Result Value Ref Range   MRSA, PCR NEGATIVE NEGATIVE   Staphylococcus aureus NEGATIVE NEGATIVE    Comment: (NOTE) The Xpert SA Assay (FDA approved for NASAL specimens in patients 18 years of age and older), is one component of a comprehensive surveillance program. It is not intended to diagnose infection nor to guide or monitor treatment. Performed at [facility], [address], [place], [place] [address]   CBC     Status: None   Collection Time: 06/21/20  7:31 AM  Result Value Ref Range   WBC 9.6 4.0 - 10.5 K/uL   RBC 4.27 3.87 - 5.11 MIL/uL   Hemoglobin 13.0 12.0 - 15.0 g/dL    Comment: REPEATED TO VERIFY POST TRANSFUSION SPECIMEN    HCT 38.0 36.0 - 46.0 %   MCV 89.0 80.0 - 100.0 fL    Comment: REPEATED TO VERIFY DELTA CHECK NOTED    MCH 30.4 26.0 - 34.0 pg   MCHC 34.2 30.0 - 36.0 g/dL   RDW 44.0 10.2 - 72.5 %   Platelets 211 150 - 400 K/uL   nRBC 0.0 0.0 - 0.2 %    Comment: Performed at [facility], [address], [place], [place] [address]  Glucose, capillary     Status: Abnormal   Collection Time: 06/21/20 10:56 AM  Result Value Ref Range   Glucose-Capillary 139 (H) 70 -  99 mg/dL    Comment: Glucose reference range applies only to samples taken after fasting for at least 8 hours.    DG Chest 1 View  Result Date: 06/20/2020 CLINICAL DATA:  Status post fall.  LEFT hip pain. EXAM: CHEST  1 VIEW COMPARISON:  Chest x-ray dated 11/03/2014. FINDINGS: Heart size and mediastinal contours are stable. Lungs are clear. No pleural effusion or pneumothorax is seen. Osseous  structures about the chest are unremarkable. IMPRESSION: No active disease. No evidence of pneumonia or pulmonary edema. Electronically Signed   By: Bary Richard M.D.   On: 06/20/2020 22:22   CT ABDOMEN PELVIS W CONTRAST  Result Date: 06/21/2020 CLINICAL DATA:  Fall, left femoral fracture, severe acute anemia. Assess for active extravasation. EXAM: CT ABDOMEN AND PELVIS WITH CONTRAST TECHNIQUE: Multidetector CT imaging of the abdomen and pelvis was performed using the standard protocol following bolus administration of intravenous contrast. CONTRAST:  OMNIPAQUE IOHEXOL 300 MG/ML  SOLN COMPARISON:  04/10/2020 FINDINGS: Lower chest: Mild bibasilar atelectasis. The visualized heart and pericardium are unremarkable. Hepatobiliary: Mild hepatic steatosis. No enhancing intrahepatic mass. No intra or extrahepatic biliary ductal dilation. Gallbladder unremarkable. Pancreas: Unremarkable Spleen: 7 mm enhancing lesion within the inferior pole of the spleen is unchanged possibly representing a flash fill hemangioma or an intraparenchymal aneurysm or hyperdense cyst. The spleen is otherwise unremarkable. No perisplenic fluid collections are identified. Adrenals/Urinary Tract: The adrenal glands are unremarkable. The kidneys are normal in size and position. Simple cortical cyst within the upper pole the left kidney is again noted. No intrarenal or ureteral calculi. No hydronephrosis. The bladder is unremarkable. Stomach/Bowel: There is moderate stool noted throughout the colon and rectal vault without evidence of obstruction. The stomach, small bowel, and large bowel are otherwise unremarkable. Appendix normal. No free intraperitoneal gas or fluid. Vascular/Lymphatic: Extensive aortoiliac atherosclerotic calcification is present. Particularly prominent atherosclerotic calcification is seen at the origin of the a right renal artery resulting in a roughly 50% stenosis of this vessel at its origin. Calcified plaque  results in less than 50% stenoses of the superior mesenteric artery and left renal arteries at their origins. 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 No aortic aneurysm. No pathologic adenopathy within the abdomen and pelvis. Reproductive: Uterus and bilateral adnexa are unremarkable. Other: No retroperitoneal hematoma identified. Musculoskeletal: In acute, comminuted left femoral intratrochanteric fracture is again identified with avulsion of the lesser trochanter and impaction and varus angulation of the major fracture fragments. No dislocation. No active extravasation within the soft tissues surrounding the fracture fragments. There is intrafascial infiltration in keeping with edema surrounding the fracture fragments. The osseous structures are otherwise intact. No lytic or blastic bone lesion. IMPRESSION: No definite radiographic stores for the patient's severe anemia. No active extravasation identified within the abdomen and pelvis and surrounding the left femoral fracture fragments. No acute intra-abdominal pathology identified. Mild hepatic steatosis. Stable 7 mm enhancing lesion within the inferior pole of the spleen possibly representing a flash filled hemangioma or intraparenchymal aneurysm. Moderate stool throughout the colon and rectal vault. Peripheral vascular disease with roughly 50% stenosis of the a right renal artery at its origin. Acute, impacted, angulated, comminuted left femoral intratrochanteric fracture. Aortic Atherosclerosis (ICD10-I70.0). Electronically Signed   By: Helyn Numbers MD   On: 06/21/2020 03:06   DG Hip Unilat With Pelvis 2-3 Views Left  Result Date: 06/20/2020 CLINICAL DATA:  Status post mechanical fall.  Pain to LEFT hip. EXAM: DG HIP (  WITH OR WITHOUT PELVIS) 2-3V LEFT COMPARISON:  None. FINDINGS: Significantly displaced/comminuted fracture of the LEFT femoral neck, with avulsion of the lesser trochanter. LEFT femoral  head remains grossly well aligned relative to the acetabulum. No additional fracture is seen within the osseous pelvis. IMPRESSION: Significantly displaced/comminuted fracture of the LEFT femoral neck, with avulsion of the lesser trochanter. Electronically Signed   By: Bary Richard M.D.   On: 06/20/2020 22:22    Review of Systems  Unable to perform ROS: Dementia   Blood pressure (!) 162/85, pulse 72, temperature 98.6 F (37 C), temperature source Oral, resp. rate 15, SpO2 98 %. Physical Exam Vitals reviewed.  HENT:     Head: Normocephalic.     Nose: Nose normal.     Mouth/Throat:     Mouth: Mucous membranes are moist.  Eyes:     Pupils: Pupils are equal, round, and reactive to light.  Cardiovascular:     Rate and Rhythm: Normal rate.     Pulses: Normal pulses.  Pulmonary:     Effort: Pulmonary effort is normal.  Abdominal:     General: Abdomen is flat.  Musculoskeletal:     Cervical back: Normal range of motion.  Skin:    General: Skin is warm.     Capillary Refill: Capillary refill takes less than 2 seconds.  Neurological:     Mental Status: She is alert. She is disoriented.  Psychiatric:        Mood and Affect: Mood normal.   Patient has mitts on her hands.  Has fairly reasonable range of motion wrist elbows and shoulders.  Mild pain on the left with any type of rotation.  Pedal pulses palpable bilaterally.  No knee effusion bilaterally.  Abdomen is soft.  Assessment/Plan: Impression is left hip fracture.  Unclear etiology of her low hemoglobin but currently she is at 13.  Plan for hip fracture fixation today.  Risk and benefits are discussed with her son including not limited to infection nerve vessel damage nonunion malunion as well as potential need for more surgery.  Anticipate skilled nursing home requirement for 2 to 3 weeks prior to going back home.  All questions answered  Burnard Bunting 06/21/2020, 11:11 AM

## 2020-06-21 NOTE — Progress Notes (Signed)
Pharmacy note: Post Operative Fracture Care Anticoagulation   80 yo female w/ hip fracture s/p repair today. She is on plavix PTA for history of TIA. Per protocol plavix can resume on POD #1  Plan -Resume plavix on 06/22/20 -Will sign off. Please contact pharmacy with any other needs.  Thank you Harland German, PharmD Clinical Pharmacist **Pharmacist phone directory can now be found on [email or web address] ([email or web address]).  Listed under [facility].

## 2020-06-21 NOTE — ED Notes (Signed)
1st unit PRBC completed with no adverse effect , VSS , afebrile ,respirations unlabored.

## 2020-06-21 NOTE — Anesthesia Postprocedure Evaluation (Signed)
Anesthesia Post Note  Patient: Autumn Schneider  Procedure(s) Performed: INTRAMEDULLARY (IM) NAIL INTERTROCHANTRIC (Left Hip)     Patient location during evaluation: PACU Anesthesia Type: General Level of consciousness: awake and alert Pain management: pain level controlled Vital Signs Assessment: post-procedure vital signs reviewed and stable Respiratory status: spontaneous breathing, nonlabored ventilation, respiratory function stable and patient connected to nasal cannula oxygen Cardiovascular status: blood pressure returned to baseline and stable Postop Assessment: no apparent nausea or vomiting Anesthetic complications: no   No complications documented.  Last Vitals:  Vitals:   06/21/20 1721 06/21/20 2021  BP: 140/74 132/74  Pulse: 60 (!) 57  Resp: 16 12  Temp: 37.1 C 36.8 C  SpO2:  100%    Last Pain:  Vitals:   06/21/20 2021  TempSrc: Oral  PainSc:                  Nelle Don Dior Stepter

## 2020-06-21 NOTE — ED Notes (Signed)
2nd unit PRBC infusing with no adverse effect , respirations unlabored , afebrile ,VSS , IV site unremarkable .

## 2020-06-21 NOTE — Transfer of Care (Signed)
Immediate Anesthesia Transfer of Care Note  Patient: Autumn Schneider  Procedure(s) Performed: INTRAMEDULLARY (IM) NAIL INTERTROCHANTRIC (Left Hip)  Patient Location: PACU  Anesthesia Type:General  Level of Consciousness: drowsy, patient cooperative and responds to stimulation  Airway & Oxygen Therapy: Patient Spontanous Breathing  Post-op Assessment: Report given to RN and Post -op Vital signs reviewed and stable  Post vital signs: Reviewed and stable  Last Vitals:  Vitals Value Taken Time  BP 142/65 06/21/20 1516  Temp    Pulse 73 06/21/20 1517  Resp 9 06/21/20 1517  SpO2 88 % 06/21/20 1517  Vitals shown include unvalidated device data.  Last Pain:  Vitals:   06/21/20 1254  TempSrc: Oral  PainSc: 0-No pain         Complications: No complications documented.

## 2020-06-21 NOTE — Progress Notes (Signed)
PROGRESS NOTE    Autumn Schneider  VVO:[id number] DOB: [birth year] DOA: 06/20/2020 PCP: Merri Brunette, MD   Brief Narrative:  Autumn Schneider is a 80 y.o. female with medical history significant for HTN, DMT2, HLD, GERD, Alzheimer's dementia by EMS after a fall at home.  Patient was walking to her bedroom after dinner and as she was going down the hallway and stopped to turn to go into her room she lost her balance and fell landing on her left side. She is unable to provide any history herself due to baseline mental status and dementia.  Left femoral neck fracture noted on imaging Orthopedic was consulted by the ER physician.  Hospitalist service has been asked to admit for further management  Assessment & Plan:   Principal Problem:   Closed hip fracture, left, initial encounter [employer]) Active Problems:   Hypertension   Alzheimer's dementia (HCC)   Diabetes mellitus type 2, controlled (HCC)   Anemia   CKD (chronic kidney disease) stage 3, GFR 30-59 ml/min (HCC)  Closed hip fracture, left -Orthopedic surgery following, plan for ORIF later today per their expertise and schedule  -Pain currently well controlled  -Plan for PT OT as early as later today versus tomorrow for further evaluation with ultimate disposition likely to be SNF given her advanced age dementia and baseline ambulatory dysfunction.  Family is certainly open to discharge home if reasonable but we discussed this was less likely.  Essential hypertension Resume home amlodipine perioperatively once able to take meds sips versus p.o. intake   Diabetes mellitus type 2, non-insulin-dependent, well controlled -We will transition to diabetic diet and ACHS glucose monitoring postoperatively  -A1c 6.6  Macrocytosis without anemia, POA -Initial hemoglobin 5.2 at intake, unlikely accurate given repeat hemoglobin for verification was 9.9 -unfortunately patient already had received 1 unit PRBC in the interim -Repeat Hgb this morning 13  CKD  3A -Continue IV fluids perioperatively while n.p.o., transition back to diabetic diet thereafter -Appears to be at baseline  Alzheimer's dementia  -Chronic, somewhat advanced, alert to person only this morning son at bedside states this is about typical for her baseline  DVT prophylaxis:  SCDs perioperatively, defer to orthopedics for transition to anticoagulation per their postop protocol Code Status:   DNR  Family Communication: Son at bedside  Status is: Inpatient  Dispo: The patient is from: Home              Anticipated d/c is to: To be determined, likely SNF pending PT evaluation and postoperative care              Anticipated d/c date is: 48 to 72 hours              Patient currently not medically stable for discharge  Consultants:   Orthopedic surgery  Procedures:   Left hip fracture repair 06/21/2020  Antimicrobials:  None  Subjective: No acute issues or events overnight, review of systems markedly limited given patient's mental status and baseline but denies any overt pain at this point which is reassuring  Objective: Vitals:   06/21/20 0338 06/21/20 0357 06/21/20 0502 06/21/20 0528  BP: 137/84 (!) 152/84 (!) 167/96 (!) 165/80  Pulse: 87 96 78 84  Resp: 18 20  18   Temp: 98.2 F (36.8 C) 98.2 F (36.8 C)  97.9 F (36.6 C)  TempSrc: Temporal Temporal  Tympanic  SpO2: 99% 96% 97% 93%   No intake or output data in the 24 hours ending 06/21/20 0750 There were no  vitals filed for this visit.  Examination:  General:  Pleasantly resting in bed, No acute distress.  Somewhat cachectic in appearance HEENT:  Normocephalic atraumatic.  Sclerae nonicteric, noninjected.  Extraocular movements intact bilaterally. Neck:  Without mass or deformity.  Trachea is midline. Lungs:  Clear to auscultate bilaterally without rhonchi, wheeze, or rales. Heart:  Regular rate and rhythm.  Without murmurs, rubs, or gallops. Abdomen:  Soft, nontender, nondistended.  Without guarding  or rebound. Extremities: Without cyanosis, clubbing, edema. Vascular:  Dorsalis pedis and posterior tibial pulses palpable bilaterally. Skin:  Warm and dry, no erythema, no ulcerations.  Data Reviewed: I have personally reviewed following labs and imaging studies  CBC: Recent Labs  Lab 06/21/20 0020 06/21/20 0118  WBC 5.9  --   NEUTROABS 5.2  --   HGB 5.2* 9.9*  HCT 17.1* 30.8*  MCV 100.6*  --   PLT 131*  --    Basic Metabolic Panel: Recent Labs  Lab 06/21/20 0118  NA 138  K 5.3*  CL 107  CO2 28  GLUCOSE 181*  BUN 31*  CREATININE 1.13*  CALCIUM 9.1   GFR: CrCl cannot be calculated (Unknown ideal weight.). Liver Function Tests: No results for input(s): AST, ALT, ALKPHOS, BILITOT, PROT, ALBUMIN in the last 168 hours. No results for input(s): LIPASE, AMYLASE in the last 168 hours. No results for input(s): AMMONIA in the last 168 hours. Coagulation Profile: Recent Labs  Lab 06/21/20 0020  INR 1.0   Cardiac Enzymes: No results for input(s): CKTOTAL, CKMB, CKMBINDEX, TROPONINI in the last 168 hours. BNP (last 3 results) No results for input(s): PROBNP in the last 8760 hours. HbA1C: No results for input(s): HGBA1C in the last 72 hours. CBG: No results for input(s): GLUCAP in the last 168 hours. Lipid Profile: No results for input(s): CHOL, HDL, LDLCALC, TRIG, CHOLHDL, LDLDIRECT in the last 72 hours. Thyroid Function Tests: No results for input(s): TSH, T4TOTAL, FREET4, T3FREE, THYROIDAB in the last 72 hours. Anemia Panel: No results for input(s): VITAMINB12, FOLATE, FERRITIN, TIBC, IRON, RETICCTPCT in the last 72 hours. Sepsis Labs: No results for input(s): PROCALCITON, LATICACIDVEN in the last 168 hours.  Recent Results (from the past 240 hour(s))  Resp Panel by RT-PCR (Flu A&B, Covid) Nasopharyngeal Swab     Status: None   Collection Time: 06/21/20 12:10 AM   Specimen: Nasopharyngeal Swab; Nasopharyngeal(NP) swabs in vial transport medium  Result Value Ref  Range Status   SARS Coronavirus 2 by RT PCR NEGATIVE NEGATIVE Final    Comment: (NOTE) SARS-CoV-2 target nucleic acids are NOT DETECTED.  The SARS-CoV-2 RNA is generally detectable in upper respiratory specimens during the acute phase of infection. The lowest concentration of SARS-CoV-2 viral copies this assay can detect is 138 copies/mL. A negative result does not preclude SARS-Cov-2 infection and should not be used as the sole basis for treatment or other patient management decisions. A negative result may occur with  improper specimen collection/handling, submission of specimen other than nasopharyngeal swab, presence of viral mutation(s) within the areas targeted by this assay, and inadequate number of viral copies(<138 copies/mL). A negative result must be combined with clinical observations, patient history, and epidemiological information. The expected result is Negative.  Fact Sheet for Patients:  BloggerCourse.com  Fact Sheet for Healthcare Providers:  SeriousBroker.it  This test is no t yet approved or cleared by the [place] FDA and  has been authorized for detection and/or diagnosis of SARS-CoV-2 by FDA under an Emergency Use Authorization (EUA). This  EUA will remain  in effect (meaning this test can be used) for the duration of the COVID-19 declaration under Section 564(b)(1) of the Act, 21 U.S.C.section 360bbb-3(b)(1), unless the authorization is terminated  or revoked sooner.       Influenza A by PCR NEGATIVE NEGATIVE Final   Influenza B by PCR NEGATIVE NEGATIVE Final    Comment: (NOTE) The Xpert Xpress SARS-CoV-2/FLU/RSV plus assay is intended as an aid in the diagnosis of influenza from Nasopharyngeal swab specimens and should not be used as a sole basis for treatment. Nasal washings and aspirates are unacceptable for Xpert Xpress SARS-CoV-2/FLU/RSV testing.  Fact Sheet for  Patients: BloggerCourse.comhttps://www.fda.gov/media/152166/download  Fact Sheet for Healthcare Providers: SeriousBroker.ithttps://www.fda.gov/media/152162/download  This test is not yet approved or cleared by the [place] FDA and has been authorized for detection and/or diagnosis of SARS-CoV-2 by FDA under an Emergency Use Authorization (EUA). This EUA will remain in effect (meaning this test can be used) for the duration of the COVID-19 declaration under Section 564(b)(1) of the Act, 21 U.S.C. section 360bbb-3(b)(1), unless the authorization is terminated or revoked.  Performed at [facility], [address] [place], [place] [address]   Surgical pcr screen     Status: None   Collection Time: 06/21/20  6:04 AM   Specimen: Nasal Mucosa; Nasal Swab  Result Value Ref Range Status   MRSA, PCR NEGATIVE NEGATIVE Final   Staphylococcus aureus NEGATIVE NEGATIVE Final    Comment: (NOTE) The Xpert SA Assay (FDA approved for NASAL specimens in patients 79 years of age and older), is one component of a comprehensive surveillance program. It is not intended to diagnose infection nor to guide or monitor treatment. Performed at [facility], [address] [place], [place] [address]          Radiology Studies: DG Chest 1 View  Result Date: 06/20/2020 CLINICAL DATA:  Status post fall.  LEFT hip pain. EXAM: CHEST  1 VIEW COMPARISON:  Chest x-ray dated 11/03/2014. FINDINGS: Heart size and mediastinal contours are stable. Lungs are clear. No pleural effusion or pneumothorax is seen. Osseous structures about the chest are unremarkable. IMPRESSION: No active disease. No evidence of pneumonia or pulmonary edema. Electronically Signed   By: Bary RichardStan  Maynard M.D.   On: 06/20/2020 22:22   CT ABDOMEN PELVIS W CONTRAST  Result Date: 06/21/2020 CLINICAL DATA:  Fall, left femoral fracture, severe acute anemia. Assess for active extravasation. EXAM: CT ABDOMEN AND PELVIS WITH CONTRAST TECHNIQUE: Multidetector CT  imaging of the abdomen and pelvis was performed using the standard protocol following bolus administration of intravenous contrast. CONTRAST:  100mL OMNIPAQUE IOHEXOL 300 MG/ML  SOLN COMPARISON:  04/10/2020 FINDINGS: Lower chest: Mild bibasilar atelectasis. The visualized heart and pericardium are unremarkable. Hepatobiliary: Mild hepatic steatosis. No enhancing intrahepatic mass. No intra or extrahepatic biliary ductal dilation. Gallbladder unremarkable. Pancreas: Unremarkable Spleen: 7 mm enhancing lesion within the inferior pole of the spleen is unchanged possibly representing a flash fill hemangioma or an intraparenchymal aneurysm or hyperdense cyst. The spleen is otherwise unremarkable. No perisplenic fluid collections are identified. Adrenals/Urinary Tract: The adrenal glands are unremarkable. The kidneys are normal in size and position. Simple cortical cyst within the upper pole the left kidney is again noted. No intrarenal or ureteral calculi. No hydronephrosis. The bladder is unremarkable. Stomach/Bowel: There is moderate stool noted throughout the colon and rectal vault without evidence of obstruction. The stomach, small bowel, and large bowel are otherwise unremarkable. Appendix normal. No free intraperitoneal  gas or fluid. Vascular/Lymphatic: Extensive aortoiliac atherosclerotic calcification is present. Particularly prominent atherosclerotic calcification is seen at the origin of the a right renal artery resulting in a roughly 50% stenosis of this vessel at its origin. Calcified plaque results in less than 50% stenoses of the superior mesenteric artery and left renal arteries at their origins. 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 No aortic aneurysm. No pathologic adenopathy within the abdomen and pelvis. Reproductive: Uterus and bilateral adnexa are unremarkable. Other: No retroperitoneal hematoma identified. Musculoskeletal: In acute, comminuted  left femoral intratrochanteric fracture is again identified with avulsion of the lesser trochanter and impaction and varus angulation of the major fracture fragments. No dislocation. No active extravasation within the soft tissues surrounding the fracture fragments. There is intrafascial infiltration in keeping with edema surrounding the fracture fragments. The osseous structures are otherwise intact. No lytic or blastic bone lesion. IMPRESSION: No definite radiographic stores for the patient's severe anemia. No active extravasation identified within the abdomen and pelvis and surrounding the left femoral fracture fragments. No acute intra-abdominal pathology identified. Mild hepatic steatosis. Stable 7 mm enhancing lesion within the inferior pole of the spleen possibly representing a flash filled hemangioma or intraparenchymal aneurysm. Moderate stool throughout the colon and rectal vault. Peripheral vascular disease with roughly 50% stenosis of the a right renal artery at its origin. Acute, impacted, angulated, comminuted left femoral intratrochanteric fracture. Aortic Atherosclerosis (ICD10-I70.0). Electronically Signed   By: Helyn Numbers MD   On: 06/21/2020 03:06   DG Hip Unilat With Pelvis 2-3 Views Left  Result Date: 06/20/2020 CLINICAL DATA:  Status post mechanical fall.  Pain to LEFT hip. EXAM: DG HIP (WITH OR WITHOUT PELVIS) 2-3V LEFT COMPARISON:  None. FINDINGS: Significantly displaced/comminuted fracture of the LEFT femoral neck, with avulsion of the lesser trochanter. LEFT femoral head remains grossly well aligned relative to the acetabulum. No additional fracture is seen within the osseous pelvis. IMPRESSION: Significantly displaced/comminuted fracture of the LEFT femoral neck, with avulsion of the lesser trochanter. Electronically Signed   By: Bary Richard M.D.   On: 06/20/2020 22:22   Scheduled Meds: . amLODipine  10 mg Oral Daily  . atorvastatin  80 mg Oral Daily  . LORazepam  0.5 mg Oral  QHS  . melatonin  10 mg Oral QHS  . [START ON 06/23/2020] metFORMIN  1,000 mg Oral BID WC   Continuous Infusions: . lactated ringers       LOS: 0 days   Time spent:  Azucena Fallen, DO [facility]  If 7PM-7AM, please contact night-coverage [email or web address]  06/21/2020, 7:50 AM

## 2020-06-21 NOTE — Plan of Care (Signed)

## 2020-06-21 NOTE — Progress Notes (Signed)
Pt. Arrived from 5 N via stretcher. Pt. Could verify name. Autumn Schneider verified birthday and type of procedure she is having.  Pt. Oriented to self only. Pt. Wearing soft padded mittins bilateral. Hand/ wrist circulation intact.

## 2020-06-21 NOTE — ED Provider Notes (Signed)
.  Critical Care Performed by: Terald Sleeper, MD Authorized by: Terald Sleeper, MD   Critical care provider statement:    Critical care time (minutes):  45   Critical care was necessary to treat or prevent imminent or life-threatening deterioration of the following conditions:  Circulatory failure   Critical care was time spent personally by me on the following activities:  Discussions with consultants, evaluation of patient's response to treatment, examination of patient, ordering and performing treatments and interventions, ordering and review of laboratory studies, ordering and review of radiographic studies, pulse oximetry, re-evaluation of patient's condition, obtaining history from patient or surrogate and review of old charts   0100 update- Hgb low demonstrating anemia.  Husband consents for blood transfusion.  He confirms she is not on A/C and has never needed a transfusion in the past, no hx of GI bleeding.  Hgb 2 months ago was 13.0.  Patient's vitals remain stable  Repeat H&H drawn BEFORE transfusion showing hgb  9.9 - I wonder if initial value was erroneous.  She has no tachycardia or hypotension to suggest hemorrhagic shock.  CT abdomen pelvis with no evident source of bleeding.  I believe she is stable for admission - will page hospitalist now - 322 AM.  Patient admitted to hospitalist.   Terald Sleeper, MD 06/21/20 1321

## 2020-06-21 NOTE — ED Notes (Signed)
Patient transported to CT scan . 

## 2020-06-21 NOTE — ED Notes (Signed)
Patient's spouse signed consent form for patient's blood transfusion .

## 2020-06-21 NOTE — Progress Notes (Signed)
Pt arrived to unit s/p IM nail to left intertrochantric, dressings CDI, Pt remains sedated, and Pt's baseline alert to person only. She has a hx of alzheimers dementia. Pt situated to room with family at bedside. Pt in bed resting comfortably.

## 2020-06-21 NOTE — Brief Op Note (Signed)
   06/21/2020  3:12 PM  PATIENT:  Autumn Schneider  80 y.o. female  PRE-OPERATIVE DIAGNOSIS:  Left Fracture Hip  POST-OPERATIVE DIAGNOSIS:  Left Fracture Hip  PROCEDURE:  Procedure(s): INTRAMEDULLARY (IM) NAIL INTERTROCHANTRIC  SURGEON:  Surgeon(s): August Saucer, Corrie Mckusick, MD  ASSISTANT: magnant pa  ANESTHESIA:   general  EBL: 75 ml    Total I/O In: 200 [IV Piggyback:200] Out: 300 [Urine:200; Blood:100]  BLOOD ADMINISTERED: none  DRAINS: none   LOCAL MEDICATIONS USED:  Marcaine mso4 clonidine  SPECIMEN:  No Specimen  COUNTS:  YES  TOURNIQUET:  * No tourniquets in log *  DICTATION: .Other Dictation: Dictation Number done  PLAN OF CARE: Admit to inpatient   PATIENT DISPOSITION:  PACU - hemodynamically stable

## 2020-06-21 NOTE — H&P (Signed)
History and Physical    Autumn Schneider RUE:[id number] DOB: 13-Apr-1940 DOA: 06/20/2020  PCP: Merri Brunette, MD   Patient coming from: Home  Chief Complaint: Fall at home, left hip pain  HPI: Autumn Schneider is a 80 y.o. female with medical history significant for HTN, DMT2, HLD, GERD, Alzheimer's dementia by EMS after a fall at home.  Patient was walking to her bedroom after dinner and as she was going down the hallway and stopped to turn to go into her room she lost her balance and fell landing on her left side.  She immediately yelled out in pain.  Her husband son and grandson were in the house but did not directly witness her fall but heard her hit the ground and yell.  She had no loss of consciousness and no signs of head injury.  No complaints of chest pain, abdominal pain or shortness of breath prior to the fall.  She did have an episode of vomiting in the ambulance after she was given fentanyl.  She has had no vomiting while in the emergency room.  She is unable to provide any history herself and her husband is at the bedside and provides history.  She has not had a recent change in her medications according to the husband.  ED Course: [name] has remained hemodynamically stable in the emergency room.  Initial labs showed a hemoglobin of 5.2 with hematocrit of 17.1.  Patient has no signs of bleeding, tachycardia or hypotension in ER physician repeated her hemoglobin hematocrit which came back at 9.9 and 30.8.  Based on the initial value of 5.2 we did order PRBC which was started to be infused prior to the repeat level being obtained.  WBC 5.9, platelet count 131,000.  Sodium 138, potassium 5.3, chloride 107, bicarb 28, glucose 181, BUN 31, creatinine 1.13, GFR 49, calcium 9.1.  X-rays reveal a left femoral neck fracture.  Orthopedic was consulted by the ER physician.  Hospitalist service has been asked to admit for further management  Review of Systems:  Cannot obtain accurate ROS due to Dementia.    Past Medical History:  Diagnosis Date  . Alzheimer disease (HCC)    diagnosed 12/12  . Anxiety and depression   . Dementia (HCC)   . Diabetes mellitus   . Esophageal reflux   . High cholesterol   . Hypertension   . Hyponatremia   . Migraine   . Osteoporosis   . SIADH (syndrome of inappropriate ADH production) (HCC)   . TIA (transient ischemic attack)   . Vitamin D deficiency     Past Surgical History:  Procedure Laterality Date  . CESAREAN SECTION    . SHOULDER SURGERY  2004    Social History  reports that she has never smoked. She has never used smokeless tobacco. She reports that she does not drink alcohol and does not use drugs.  Allergies  Allergen Reactions  . Aricept [Donepezil Hcl]     Doesn't remember  . Erythromycin Hives    Family History  Problem Relation Age of Onset  . Stroke Mother   . COPD Mother   . Diabetes Sister   . Breast cancer Sister   . Fibromyalgia Sister   . Stroke Maternal Grandmother   . CVA Maternal Grandmother   . Heart attack Maternal Grandfather      Prior to Admission medications   Medication Sig Start Date End Date Taking? Authorizing Provider  ACCU-CHEK SMARTVIEW test strip  05/08/12   [provider]  amLODipine (NORVASC) 10 MG tablet Take 10 mg by mouth daily.    [provider]  aspirin EC 81 MG tablet Take 81 mg by mouth daily. Patient not taking: Reported on 04/16/2020    [provider]  atorvastatin (LIPITOR) 80 MG tablet Take 1 tablet (80 mg total) by mouth daily. 05/19/17 05/19/18  Laverna Peace, MD  Calcium Carbonate-Vitamin D 600-400 MG-UNIT tablet Take 1 tablet by mouth daily. Patient not taking: Reported on 04/16/2020    [provider]  clopidogrel (PLAVIX) 75 MG tablet Take 1 tablet (75 mg total) by mouth daily. Patient not taking: Reported on 04/16/2020 07/11/17   Ihor Austin, NP  clorazepate (TRANXENE) 3.75 MG tablet Take 3.75 mg by mouth 2 (two) times daily.  01/27/15    [provider]  denosumab (PROLIA) 60 MG/ML SOSY injection Inject 60 mg into the skin every 6 (six) months.    [provider]  feeding supplement, ENSURE ENLIVE, (ENSURE ENLIVE) LIQD Take 237 mLs by mouth 2 (two) times daily between meals. 05/19/17   Roberto Scales D, MD  hydrOXYzine (ATARAX/VISTARIL) 25 MG tablet Take 25 mg by mouth 2 (two) times daily.    [provider]  LORazepam (ATIVAN) 0.5 MG tablet Take 0.5 mg by mouth at bedtime.    [provider]  Melatonin 10 MG TABS Take 1 tablet by mouth at bedtime.    [provider]  memantine (NAMENDA) 10 MG tablet Take 10 mg by mouth 2 (two) times daily. Patient not taking: Reported on 04/16/2020    [provider]  metFORMIN (GLUCOPHAGE) 500 MG tablet Take 1,000 mg by mouth 2 (two) times daily.  07/02/12   [provider]  mirtazapine (REMERON) 30 MG tablet Take 30 mg by mouth at bedtime. 04/18/17   [provider]  pantoprazole (PROTONIX) 40 MG tablet Take 40 mg by mouth daily.    [provider]  senna (SENOKOT) 8.6 MG TABS tablet Take 2 tablets by mouth 2 (two) times daily.    [provider]  sodium chloride 1 G tablet Take 1 g by mouth 2 (two) times daily with a meal.     [provider]    Physical Exam: Vitals:   06/21/20 0300 06/21/20 0330 06/21/20 0338 06/21/20 0357  BP: 127/71 (!) 142/86 137/84 (!) 152/84  Pulse: 74 76 87 96  Resp: 16 18 18 20   Temp:  98.2 F (36.8 C) 98.2 F (36.8 C) 98.2 F (36.8 C)  TempSrc:  Temporal Temporal Temporal  SpO2: 94% 100% 99% 96%    Constitutional: NAD, calm, comfortable Vitals:   06/21/20 0300 06/21/20 0330 06/21/20 0338 06/21/20 0357  BP: 127/71 (!) 142/86 137/84 (!) 152/84  Pulse: 74 76 87 96  Resp: 16 18 18 20   Temp:  98.2 F (36.8 C) 98.2 F (36.8 C) 98.2 F (36.8 C)  TempSrc:  Temporal Temporal Temporal  SpO2: 94% 100% 99% 96%   General: WDWN, Alert and oriented to self  Eyes:  PERRL, conjunctivae normal.  Sclera nonicteric HENT:  [place]/AT, external ears normal.  Nares patent without epistasis.  Mucous membranes are moist. .  Neck: Soft, normal passive range of motion, supple, no masses, Trachea midline Respiratory: clear to auscultation bilaterally, no wheezing, no crackles. Normal respiratory effort. No accessory muscle use.  Cardiovascular: Regular rate and rhythm, no murmurs / rubs / gallops. No extremity edema. 1+ pedal pulses. Abdomen: Soft, no tenderness, nondistended. No masses palpated. Bowel sounds  normoactive Musculoskeletal: Left leg shortened and externally rotated. Moves upper and right lower extremities spontaneously. Winces with pain to palpation of left hip. no cyanosis. Normal muscle tone.  Skin: Warm, dry, intact no rashes, lesions, ulcers. No induration Neurologic: Not assessed due to dementia.   Labs on Admission: I have personally reviewed following labs and imaging studies  CBC: Recent Labs  Lab 06/21/20 0020 06/21/20 0118  WBC 5.9  --   NEUTROABS 5.2  --   HGB 5.2* 9.9*  HCT 17.1* 30.8*  MCV 100.6*  --   PLT 131*  --     Basic Metabolic Panel: Recent Labs  Lab 06/21/20 0118  NA 138  K 5.3*  CL 107  CO2 28  GLUCOSE 181*  BUN 31*  CREATININE 1.13*  CALCIUM 9.1    GFR: CrCl cannot be calculated (Unknown ideal weight.).  Liver Function Tests: No results for input(s): AST, ALT, ALKPHOS, BILITOT, PROT, ALBUMIN in the last 168 hours.  Urine analysis:    Component Value Date/Time   COLORURINE YELLOW 04/10/2020 2047   APPEARANCEUR CLEAR 04/10/2020 2047   LABSPEC 1.024 04/10/2020 2047   PHURINE 5.0 04/10/2020 2047   GLUCOSEU 50 (A) 04/10/2020 2047   HGBUR NEGATIVE 04/10/2020 2047   BILIRUBINUR NEGATIVE 04/10/2020 2047   KETONESUR NEGATIVE 04/10/2020 2047   PROTEINUR 30 (A) 04/10/2020 2047   UROBILINOGEN 0.2 11/03/2014 1243   NITRITE POSITIVE (A) 04/10/2020 2047   LEUKOCYTESUR SMALL (A) 04/10/2020 2047    Radiological  Exams on Admission: DG Chest 1 View  Result Date: 06/20/2020 CLINICAL DATA:  Status post fall.  LEFT hip pain. EXAM: CHEST  1 VIEW COMPARISON:  Chest x-ray dated 11/03/2014. FINDINGS: Heart size and mediastinal contours are stable. Lungs are clear. No pleural effusion or pneumothorax is seen. Osseous structures about the chest are unremarkable. IMPRESSION: No active disease. No evidence of pneumonia or pulmonary edema. Electronically Signed   By: Bary Richard M.D.   On: 06/20/2020 22:22   CT ABDOMEN PELVIS W CONTRAST  Result Date: 06/21/2020 CLINICAL DATA:  Fall, left femoral fracture, severe acute anemia. Assess for active extravasation. EXAM: CT ABDOMEN AND PELVIS WITH CONTRAST TECHNIQUE: Multidetector CT imaging of the abdomen and pelvis was performed using the standard protocol following bolus administration of intravenous contrast. CONTRAST:  OMNIPAQUE IOHEXOL 300 MG/ML  SOLN COMPARISON:  04/10/2020 FINDINGS: Lower chest: Mild bibasilar atelectasis. The visualized heart and pericardium are unremarkable. Hepatobiliary: Mild hepatic steatosis. No enhancing intrahepatic mass. No intra or extrahepatic biliary ductal dilation. Gallbladder unremarkable. Pancreas: Unremarkable Spleen: 7 mm enhancing lesion within the inferior pole of the spleen is unchanged possibly representing a flash fill hemangioma or an intraparenchymal aneurysm or hyperdense cyst. The spleen is otherwise unremarkable. No perisplenic fluid collections are identified. Adrenals/Urinary Tract: The adrenal glands are unremarkable. The kidneys are normal in size and position. Simple cortical cyst within the upper pole the left kidney is again noted. No intrarenal or ureteral calculi. No hydronephrosis. The bladder is unremarkable. Stomach/Bowel: There is moderate stool noted throughout the colon and rectal vault without evidence of obstruction. The stomach, small bowel, and large bowel are otherwise unremarkable. Appendix normal. No free  intraperitoneal gas or fluid. Vascular/Lymphatic: Extensive aortoiliac atherosclerotic calcification is present. Particularly prominent atherosclerotic calcification is seen at the origin of the a right renal artery resulting in a roughly 50% stenosis of this vessel at its origin. Calcified plaque results in less than 50% stenoses of the superior mesenteric artery and left renal arteries at  their origins. 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 No aortic aneurysm. No pathologic adenopathy within the abdomen and pelvis. Reproductive: Uterus and bilateral adnexa are unremarkable. Other: No retroperitoneal hematoma identified. Musculoskeletal: In acute, comminuted left femoral intratrochanteric fracture is again identified with avulsion of the lesser trochanter and impaction and varus angulation of the major fracture fragments. No dislocation. No active extravasation within the soft tissues surrounding the fracture fragments. There is intrafascial infiltration in keeping with edema surrounding the fracture fragments. The osseous structures are otherwise intact. No lytic or blastic bone lesion. IMPRESSION: No definite radiographic stores for the patient's severe anemia. No active extravasation identified within the abdomen and pelvis and surrounding the left femoral fracture fragments. No acute intra-abdominal pathology identified. Mild hepatic steatosis. Stable 7 mm enhancing lesion within the inferior pole of the spleen possibly representing a flash filled hemangioma or intraparenchymal aneurysm. Moderate stool throughout the colon and rectal vault. Peripheral vascular disease with roughly 50% stenosis of the a right renal artery at its origin. Acute, impacted, angulated, comminuted left femoral intratrochanteric fracture. Aortic Atherosclerosis (ICD10-I70.0). Electronically Signed   By: Helyn NumbersAshesh  Parikh MD   On: 06/21/2020 03:06   DG Hip Unilat With Pelvis 2-3  Views Left  Result Date: 06/20/2020 CLINICAL DATA:  Status post mechanical fall.  Pain to LEFT hip. EXAM: DG HIP (WITH OR WITHOUT PELVIS) 2-3V LEFT COMPARISON:  None. FINDINGS: Significantly displaced/comminuted fracture of the LEFT femoral neck, with avulsion of the lesser trochanter. LEFT femoral head remains grossly well aligned relative to the acetabulum. No additional fracture is seen within the osseous pelvis. IMPRESSION: Significantly displaced/comminuted fracture of the LEFT femoral neck, with avulsion of the lesser trochanter. Electronically Signed   By: Bary RichardStan  Maynard M.D.   On: 06/20/2020 22:22    EKG: Independently reviewed.  EKG shows normal sinus rhythm with no acute ST elevation or depression.  QTc 438  Assessment/Plan Principal Problem:   Closed hip fracture, left Autumn Schneider is admitted to Medical telemetry bed.  Pain control with IV dilaudid as needed for severe pain. Norco as needed for moderate pain. Orthopedic surgery consulted.  She will need physical therapy and discharge planning consults after surgical repair of her hip.  Patient most likely need to go at least to short-term rehab before going home after hip repair. IV fluid hydration with LR at 75 ml/hr provided  Active Problems:   Hypertension Norvasc.  Monitor blood pressure    Diabetes mellitus type 2, controlled  Continue home dose of metformin.  Monitor blood sugars every 8 hours while NPO.  Signs Gonczy provided need for glycemic control.  Check hemoglobin A1c    Anemia Patient with initial hemoglobin of 5.2.  ER physician ordered a unit of packed red blood cells was started before repeat hemoglobin was obtained by the ER physician.  Repeat hemoglobin was 0.9.  PRBCs were already running by that point.  ER physician did do a CT of the abdomen pelvis to make sure there is no intra abdominal bleeding after her fall and the CT was negative. The initial hemoglobin of 5.2 appears to be erroneous Repeat CBC in  morning    CKD 3A Stable.  Check electrolytes and renal function in am IVF hydration overnight.    Alzheimer's dementia  Chronic     DVT prophylaxis: SCD's for DVT prophylaxis Code Status:   DNR  Family Communication:  Diagnosis and plan discussed with patient's husband who is at bedside.  He verbalized understanding and agrees with plan.  Further recommendations to follow as clinically indicated Disposition Plan:   Patient is from:  Home  Anticipated DC to:  SNF for rehab  Anticipated DC date:  Anticipate 2 midnight or more stay in the hospital  Anticipated DC barriers: Barriers to discharge will be finding an accepting rehabilitation bed  Consults called:  Orthopedic surgery consulted by ER physician Admission status:  Inpatient  Claudean Severance Leeanna Slaby MD [facility]  How to contact the [facility] Attending or Consulting provider 7A - 7P or covering provider during after hours 7P -7A, for this patient?   1. Check the care team in [facility] and look for a) attending/consulting [facility] provider listed and b) the [facility] team listed 2. Log into [email or web address] and use [place]'s universal password to access. If you do not have the password, please contact the hospital operator. 3. Locate the [facility] provider you are looking for under [facility] and page to a number that you can be directly reached. 4. If you still have difficulty reaching the provider, please page the [facility] (Director on Call) for the Hospitalists listed on amion for assistance.  06/21/2020, 3:59 AM

## 2020-06-21 NOTE — Anesthesia Preprocedure Evaluation (Addendum)
Anesthesia Evaluation  Patient identified by MRN, date of birth, ID band Patient confused    Reviewed: Allergy & Precautions, NPO status , Patient's Chart, lab work & pertinent test results  Airway Mallampati: II  TM Distance: >3 FB Neck ROM: Full    Dental  (+) Teeth Intact   Pulmonary neg pulmonary ROS,    Pulmonary exam normal        Cardiovascular hypertension, Pt. on medications  Rhythm:Regular Rate:Normal     Neuro/Psych  Headaches, Anxiety Depression Dementia TIA   GI/Hepatic Neg liver ROS, GERD  Medicated,  Endo/Other  diabetes, Type 2, Oral Hypoglycemic Agents  Renal/GU negative Renal ROS  negative genitourinary   Musculoskeletal Left hip fx   Abdominal (+)  Abdomen: soft. Bowel sounds: normal.  Peds  Hematology negative hematology ROS (+) anemia ,   Anesthesia Other Findings   Reproductive/Obstetrics                            Anesthesia Physical Anesthesia Plan  ASA: III  Anesthesia Plan: General   Post-op Pain Management:    Induction: Intravenous  PONV Risk Score and Plan: 3 and Ondansetron and Treatment may vary due to age or medical condition  Airway Management Planned: Mask and Oral ETT  Additional Equipment: None  Intra-op Plan:   Post-operative Plan: Extubation in OR  Informed Consent: I have reviewed the patients History and Physical, chart, labs and discussed the procedure including the risks, benefits and alternatives for the proposed anesthesia with the patient or authorized representative who has indicated his/her understanding and acceptance.   Patient has DNR.   Dental advisory given  Plan Discussed with: CRNA  Anesthesia Plan Comments: (Lab Results      Component                Value               Date                      WBC                      9.6                 06/21/2020                HGB                      13.0                 06/21/2020                HCT                      38.0                06/21/2020                MCV                      89.0                06/21/2020                PLT                      211  06/21/2020           Lab Results      Component                Value               Date                      NA                       138                 06/21/2020                K                        5.3 (H)             06/21/2020                CO2                      28                  06/21/2020                GLUCOSE                  181 (H)             06/21/2020                BUN                      31 (H)              06/21/2020                CREATININE               1.13 (H)            06/21/2020                CALCIUM                  9.1                 06/21/2020                GFRNONAA                 49 (L)              06/21/2020                GFRAA                    56 (L)              05/18/2017          )       Anesthesia Quick Evaluation

## 2020-06-22 ENCOUNTER — Encounter (HOSPITAL_COMMUNITY): Payer: Self-pay | Admitting: Orthopedic Surgery

## 2020-06-22 LAB — GLUCOSE, CAPILLARY
Glucose-Capillary: 129 mg/dL — ABNORMAL HIGH (ref 70–99)
Glucose-Capillary: 164 mg/dL — ABNORMAL HIGH (ref 70–99)
Glucose-Capillary: 179 mg/dL — ABNORMAL HIGH (ref 70–99)
Glucose-Capillary: 205 mg/dL — ABNORMAL HIGH (ref 70–99)

## 2020-06-22 LAB — TYPE AND SCREEN
ABO/RH(D): A POS
Antibody Screen: NEGATIVE
Unit division: 0
Unit division: 0

## 2020-06-22 LAB — CBC
HCT: 35.5 % — ABNORMAL LOW (ref 36.0–46.0)
Hemoglobin: 11.9 g/dL — ABNORMAL LOW (ref 12.0–15.0)
MCH: 30.1 pg (ref 26.0–34.0)
MCHC: 33.5 g/dL (ref 30.0–36.0)
MCV: 89.9 fL (ref 80.0–100.0)
Platelets: 206 10*3/uL (ref 150–400)
RBC: 3.95 MIL/uL (ref 3.87–5.11)
RDW: 13.7 % (ref 11.5–15.5)
WBC: 9 10*3/uL (ref 4.0–10.5)
nRBC: 0 % (ref 0.0–0.2)

## 2020-06-22 LAB — BPAM RBC
Blood Product Expiration Date: 202206202359
Blood Product Expiration Date: 202206222359
ISSUE DATE / TIME: 202206050157
ISSUE DATE / TIME: 202206050325
Unit Type and Rh: 6200
Unit Type and Rh: 6200

## 2020-06-22 LAB — HEMOGLOBIN A1C
Hgb A1c MFr Bld: 6.5 % — ABNORMAL HIGH (ref 4.8–5.6)
Mean Plasma Glucose: 140 mg/dL

## 2020-06-22 LAB — BASIC METABOLIC PANEL
Anion gap: 8 (ref 5–15)
BUN: 19 mg/dL (ref 8–23)
CO2: 26 mmol/L (ref 22–32)
Calcium: 8.4 mg/dL — ABNORMAL LOW (ref 8.9–10.3)
Chloride: 102 mmol/L (ref 98–111)
Creatinine, Ser: 0.95 mg/dL (ref 0.44–1.00)
GFR, Estimated: >60 mL/min (ref >60)
Glucose, Bld: 121 mg/dL — ABNORMAL HIGH (ref 70–99)
Potassium: 4.4 mmol/L (ref 3.5–5.1)
Sodium: 136 mmol/L (ref 135–145)

## 2020-06-22 MED ORDER — CHLORHEXIDINE GLUCONATE CLOTH 2 % EX PADS
6.0000 | MEDICATED_PAD | Freq: Every day | CUTANEOUS | Status: DC
  Administered 2020-06-22: 6 via TOPICAL

## 2020-06-22 MED ORDER — ENSURE MAX PROTEIN PO LIQD
11.0000 [oz_av] | Freq: Every day | ORAL | Status: DC
  Administered 2020-06-22 – 2020-06-25 (×3): 11 [oz_av] via ORAL

## 2020-06-22 MED ORDER — ADULT MULTIVITAMIN W/MINERALS CH
1.0000 | ORAL_TABLET | Freq: Every day | ORAL | Status: DC
  Administered 2020-06-22 – 2020-06-24 (×3): 1 via ORAL
  Filled 2020-06-22 (×4): qty 1

## 2020-06-22 NOTE — TOC Initial Note (Signed)
Transition of Care [facility]) - Initial/Assessment Note    Patient Details  Name: Autumn Schneider MRN: [id number] Date of Birth: 20-Feb-1940  [facility] CM/SW Contact:    Ralene Bathe, LCSWA Phone Number: 06/22/2020, 3:47 PM  Clinical Narrative:                 CSW received consult for possible SNF placement at time of discharge. CSW spoke with patient's spouse, Windy Fast, due to the patient having advanced dementia.   Patient's spouse expressed understanding of PT recommendation and is agreeable to SNF placement at time of discharge. The family reports preference for [facility] and being open to CSW faxing the referral to different facilities.  CSW discussed insurance authorization process.  Patient has received the COVID vaccines.   Expected Discharge Plan: Skilled Nursing Facility Barriers to Discharge: Insurance Authorization,SNF Pending bed offer,Continued Medical Work up   Patient Goals and CMS Choice   CMS Medicare.gov Compare Post Acute Care list provided to:: Patient Represenative (must comment) Choice offered to / list presented to : Spouse  Expected Discharge Plan and Services Expected Discharge Plan: Skilled Nursing Facility       Living arrangements for the past 2 months: Single Family Home                                      Prior Living Arrangements/Services Living arrangements for the past 2 months: Single Family Home Lives with:: Spouse Patient language and need for interpreter reviewed:: Yes        Need for Family Participation in Patient Care: Yes (Comment) Care giver support system in place?: Yes (comment)   Criminal Activity/Legal Involvement Pertinent to Current Situation/Hospitalization: No - Comment as needed  Activities of Daily Living   ADL Screening (condition at time of admission) Patient's cognitive ability adequate to safely complete daily activities?: No Is the patient deaf or have difficulty hearing?: No Does the  patient have difficulty seeing, even when wearing glasses/contacts?: No Does the patient have difficulty concentrating, remembering, or making decisions?: Yes Patient able to express need for assistance with ADLs?: No Does the patient have difficulty dressing or bathing?: Yes Independently performs ADLs?: No Communication: Dependent Is this a change from baseline?: Pre-admission baseline Dressing (OT): Dependent Is this a change from baseline?: Pre-admission baseline Grooming: Dependent Is this a change from baseline?: Pre-admission baseline Feeding: Independent Bathing: Dependent Is this a change from baseline?: Pre-admission baseline Toileting: Dependent Is this a change from baseline?: Pre-admission baseline In/Out Bed: Dependent Is this a change from baseline?: Pre-admission baseline Walks in Home: Independent Does the patient have difficulty walking or climbing stairs?: Yes Weakness of Legs: Both Weakness of Arms/Hands: None  Permission Sought/Granted   Permission granted to share information with : Yes, Verbal Permission Granted     Permission granted to share info w AGENCY: SNF's        Emotional Assessment       Orientation: : Oriented to Self Alcohol / Substance Use: Not Applicable Psych Involvement: No (comment)  Admission diagnosis:  Hip fx (HCC) [S72.009A] Left displaced femoral neck fracture (HCC) [S72.002A] Closed hip fracture, left, initial encounter (HCC) [S72.002A] Patient Active Problem List   Diagnosis Date Noted  . Closed hip fracture, left, initial encounter (HCC) 06/21/2020  . Diabetes mellitus type 2, controlled (HCC) 06/21/2020  . Anemia 06/21/2020  . CKD (chronic kidney disease) stage 3, GFR 30-59  ml/min (HCC) 06/21/2020  . Palliative care encounter 05/18/2018  . Fall 11/03/2014  . Diabetes mellitus type 2, uncontrolled (HCC) 11/03/2014  . Alzheimer's dementia (HCC) 11/03/2014  . Depression 11/03/2014  . Hypercholesteremia 11/03/2014  .  Elevated CK 11/03/2014  . TIA (transient ischemic attack) 03/07/2011  . Hyponatremia 03/07/2011  . EOSINOPHILIA 03/13/2008  . PRURITUS 02/21/2008  . DIABETES, TYPE 2 05/17/2007  . Hypertension 05/17/2007  . EUSTACHIAN TUBE DYSFUNCTION 05/01/2007  . LARYNGITIS, ACUTE 05/01/2007  . ALLERGIC RHINITIS 05/01/2007   PCP:  Merri Brunette, MD Pharmacy:   [facility] [phone], [place] - [address] AT [address] [place] [address] Phone: [phone] Fax: [phone]     Social Determinants of Health (SDOH) Interventions    Readmission Risk Interventions No flowsheet data found.

## 2020-06-22 NOTE — Progress Notes (Signed)
  Subjective: Autumn Schneider is a 80 y.o. female s/p left hip IM nail.  They are POD 1.  Pt's pain is controlled.  Patient has advanced dementia so history is limited but her husband is bedside and feels that she is doing well.  He denies any major complaints that he has noticed from her.  She is currently eating some applesauce upon examination..    Objective: Vital signs in last 24 hours: Temp:  [97.4 F (36.3 C)-98.3 F (36.8 C)] 98 F (36.7 C) (06/06 1500) Pulse Rate:  [57-84] 83 (06/06 1500) Resp:  [12-18] 17 (06/06 1500) BP: (132-151)/(74-115) 143/86 (06/06 1500) SpO2:  [96 %-100 %] 96 % (06/06 1500)  Intake/Output from previous day: 06/05 0701 - 06/06 0700 In: 1106.3 [I.V.:806.3; IV Piggyback:300] Out: 1200 [Urine:1100; Blood:100] Intake/Output this shift: Total I/O In: 120 [P.O.:120] Out: -   Exam:  No gross blood or drainage overlying the dressings Left foot warm and well-perfused Able to dorsiflex and plantarflex the left foot No calf tenderness.  Negative Homans' sign.  No significant pain with logroll of left hip.   Labs: Recent Labs    06/21/20 0020 06/21/20 0118 06/21/20 0731 06/22/20 0222  HGB 5.2* 9.9* 13.0 11.9*   Recent Labs    06/21/20 0731 06/22/20 0222  WBC 9.6 9.0  RBC 4.27 3.95  HCT 38.0 35.5*  PLT 211 206   Recent Labs    06/21/20 0118 06/22/20 0222  NA 138 136  K 5.3* 4.4  CL 107 102  CO2 28 26  BUN 31* 19  CREATININE 1.13* 0.95  GLUCOSE 181* 121*  CALCIUM 9.1 8.4*   Recent Labs    06/21/20 0020  INR 1.0    Assessment/Plan: Pt is POD 1 s/p left hip IM nail.    -Disposition pending medical team clearance and decision  -Nonweightbearing to left lower extremity  -DVT Prophylaxis: Aspirin/Plavix and SCDs  -Follow-up with Dr. August Saucer in clinic in 10 to 14 days     Julieanne Cotton 06/22/2020, 6:19 PM

## 2020-06-22 NOTE — Progress Notes (Signed)
PROGRESS NOTE    Autumn Schneider  WUJ:[id number] DOB: 01/22/1940 DOA: 06/20/2020 PCP: Merri BrunetteSmith, Candace, MD   Brief Narrative:  Autumn Schneider is a 80 y.o. female with medical history significant for HTN, DMT2, HLD, GERD, Alzheimer's dementia by EMS after a fall at home.  Patient was walking to her bedroom after dinner and as she was going down the hallway and stopped to turn to go into her room she lost her balance and fell landing on her left side. She is unable to provide any history herself due to baseline mental status and dementia.  Left femoral neck fracture noted on imaging Orthopedic was consulted by the ER physician.  Hospitalist service has been asked to admit for further management  Assessment & Plan:   Principal Problem:   Closed hip fracture, left, initial encounter [facility](HCC) Active Problems:   Hypertension   Alzheimer's dementia (HCC)   Diabetes mellitus type 2, controlled (HCC)   Anemia   CKD (chronic kidney disease) stage 3, GFR 30-59 ml/min (HCC)  Closed hip fracture, left -Orthopedic surgery following - IM nail placed 06/21/20 - patient tolerated well -Pain currently well controlled  -PT OT ongoing, likely disposition SNF however given advanced age and dementia she may be poor candidate for discharge to facility, will await PT recommendations pain.  Essential hypertension Continue home amlodipine  Diabetes mellitus type 2, non-insulin-dependent, well controlled -Continue diabetic diet and ACHS glucose monitoring postoperatively  -A1c 6.6  Macrocytosis without anemia, POA -Initial hemoglobin 5.2 at intake, unlikely accurate given repeat hemoglobin for verification was 9.9 -unfortunately patient already had received 1 unit PRBC in the interim -Repeat Hgb this morning 11.9  CKD 3A -Discontinue IV fluids now tolerating p.o. back at baseline -Appears to be at baseline  Alzheimer's dementia  -Chronic, somewhat advanced, alert to person only this morning son at bedside states this  is about typical for her baseline  DVT prophylaxis:  SCDs perioperatively, defer to orthopedics for transition to anticoagulation per their postop protocol Code Status:   DNR  Family Communication: Husband at bedside  Status is: Inpatient  Dispo: The patient is from: Home              Anticipated d/c is to: To be determined, likely SNF pending PT evaluation and postoperative care              Anticipated d/c date is: 48 to 72 hours              Patient currently not medically stable for discharge  Consultants:   Orthopedic surgery  Procedures:   Left hip fracture repair 06/21/2020  Antimicrobials:  None  Subjective: No acute issues or events overnight, review of systems markedly limited given patient's mental status and baseline; somewhat poor p.o. intake and medication compliance due to mental status, family assisting at bedside which is much appreciated  Objective: Vitals:   06/21/20 1721 06/21/20 2021 06/22/20 0006 06/22/20 0406  BP: 140/74 132/74 (!) 151/89 (!) 150/79  Pulse: 60 (!) 57 65 81  Resp: 16 12 16 16   Temp: 98.8 F (37.1 C) 98.2 F (36.8 C) 97.6 F (36.4 C) 98.3 F (36.8 C)  TempSrc:  Oral Axillary Axillary  SpO2:  100% 100% 100%    Intake/Output Summary (Last 24 hours) at 06/22/2020 0736 Last data filed at 06/22/2020 0644 Gross per 24 hour  Intake 1106.31 ml  Output 1200 ml  Net -93.69 ml   There were no vitals filed for this visit.  Examination:  General:  Pleasantly resting in bed, No acute distress.  Somewhat cachectic in appearance HEENT:  Normocephalic atraumatic.  Sclerae nonicteric, noninjected.  Extraocular movements intact bilaterally. Neck:  Without mass or deformity.  Trachea is midline. Lungs:  Clear to auscultate bilaterally without rhonchi, wheeze, or rales. Heart:  Regular rate and rhythm.  Without murmurs, rubs, or gallops. Abdomen:  Soft, nontender, nondistended.  Without guarding or rebound. Extremities: Without cyanosis,  clubbing, edema. Vascular:  Dorsalis pedis and posterior tibial pulses palpable bilaterally. Skin:  Warm and dry, no erythema, no ulcerations.  Data Reviewed: I have personally reviewed following labs and imaging studies  CBC: Recent Labs  Lab 06/21/20 0020 06/21/20 0118 06/21/20 0731 06/22/20 0222  WBC 5.9  --  9.6 9.0  NEUTROABS 5.2  --   --   --   HGB 5.2* 9.9* 13.0 11.9*  HCT 17.1* 30.8* 38.0 35.5*  MCV 100.6*  --  89.0 89.9  PLT 131*  --  211 206   Basic Metabolic Panel: Recent Labs  Lab 06/21/20 0118 06/22/20 0222  NA 138 136  K 5.3* 4.4  CL 107 102  CO2 28 26  GLUCOSE 181* 121*  BUN 31* 19  CREATININE 1.13* 0.95  CALCIUM 9.1 8.4*   GFR: CrCl cannot be calculated (Unknown ideal weight.). Liver Function Tests: No results for input(s): AST, ALT, ALKPHOS, BILITOT, PROT, ALBUMIN in the last 168 hours. No results for input(s): LIPASE, AMYLASE in the last 168 hours. No results for input(s): AMMONIA in the last 168 hours. Coagulation Profile: Recent Labs  Lab 06/21/20 0020  INR 1.0   Cardiac Enzymes: No results for input(s): CKTOTAL, CKMB, CKMBINDEX, TROPONINI in the last 168 hours. BNP (last 3 results) No results for input(s): PROBNP in the last 8760 hours. HbA1C: No results for input(s): HGBA1C in the last 72 hours. CBG: Recent Labs  Lab 06/21/20 1056 06/21/20 1303 06/21/20 1624 06/21/20 2250 06/22/20 0639  GLUCAP 139* 147* 147* 167* 129*   Lipid Profile: No results for input(s): CHOL, HDL, LDLCALC, TRIG, CHOLHDL, LDLDIRECT in the last 72 hours. Thyroid Function Tests: No results for input(s): TSH, T4TOTAL, FREET4, T3FREE, THYROIDAB in the last 72 hours. Anemia Panel: No results for input(s): VITAMINB12, FOLATE, FERRITIN, TIBC, IRON, RETICCTPCT in the last 72 hours. Sepsis Labs: No results for input(s): PROCALCITON, LATICACIDVEN in the last 168 hours.  Recent Results (from the past 240 hour(s))  Resp Panel by RT-PCR (Flu A&B, Covid)  Nasopharyngeal Swab     Status: None   Collection Time: 06/21/20 12:10 AM   Specimen: Nasopharyngeal Swab; Nasopharyngeal(NP) swabs in vial transport medium  Result Value Ref Range Status   SARS Coronavirus 2 by RT PCR NEGATIVE NEGATIVE Final    Comment: (NOTE) SARS-CoV-2 target nucleic acids are NOT DETECTED.  The SARS-CoV-2 RNA is generally detectable in upper respiratory specimens during the acute phase of infection. The lowest concentration of SARS-CoV-2 viral copies this assay can detect is 138 copies/mL. A negative result does not preclude SARS-Cov-2 infection and should not be used as the sole basis for treatment or other patient management decisions. A negative result may occur with  improper specimen collection/handling, submission of specimen other than nasopharyngeal swab, presence of viral mutation(s) within the areas targeted by this assay, and inadequate number of viral copies(<138 copies/mL). A negative result must be combined with clinical observations, patient history, and epidemiological information. The expected result is Negative.  Fact Sheet for Patients:  BloggerCourse.com  Fact Sheet for Healthcare Providers:  SeriousBroker.it  This test is no t yet approved or cleared by the [place] and  has been authorized for detection and/or diagnosis of SARS-CoV-2 by FDA under an Emergency Use Authorization (EUA). This EUA will remain  in effect (meaning this test can be used) for the duration of the COVID-19 declaration under Section 564(b)(1) of the Act, 21 U.S.C.section 360bbb-3(b)(1), unless the authorization is terminated  or revoked sooner.       Influenza A by PCR NEGATIVE NEGATIVE Final   Influenza B by PCR NEGATIVE NEGATIVE Final    Comment: (NOTE) The Xpert Xpress SARS-CoV-2/FLU/RSV plus assay is intended as an aid in the diagnosis of influenza from Nasopharyngeal swab specimens and should not be  used as a sole basis for treatment. Nasal washings and aspirates are unacceptable for Xpert Xpress SARS-CoV-2/FLU/RSV testing.  Fact Sheet for Patients: BloggerCourse.com  Fact Sheet for Healthcare Providers: SeriousBroker.it  This test is not yet approved or cleared by the [place] FDA and has been authorized for detection and/or diagnosis of SARS-CoV-2 by FDA under an Emergency Use Authorization (EUA). This EUA will remain in effect (meaning this test can be used) for the duration of the COVID-19 declaration under Section 564(b)(1) of the Act, 21 U.S.C. section 360bbb-3(b)(1), unless the authorization is terminated or revoked.  Performed at [facility], [address], [place], [place] [address]   Surgical pcr screen     Status: None   Collection Time: 06/21/20  6:04 AM   Specimen: Nasal Mucosa; Nasal Swab  Result Value Ref Range Status   MRSA, PCR NEGATIVE NEGATIVE Final   Staphylococcus aureus NEGATIVE NEGATIVE Final    Comment: (NOTE) The Xpert SA Assay (FDA approved for NASAL specimens in patients 47 years of age and older), is one component of a comprehensive surveillance program. It is not intended to diagnose infection nor to guide or monitor treatment. Performed at [facility], [address] [place], [place] [address]          Radiology Studies: DG Chest 1 View  Result Date: 06/20/2020 CLINICAL DATA:  Status post fall.  LEFT hip pain. EXAM: CHEST  1 VIEW COMPARISON:  Chest x-ray dated 11/03/2014. FINDINGS: Heart size and mediastinal contours are stable. Lungs are clear. No pleural effusion or pneumothorax is seen. Osseous structures about the chest are unremarkable. IMPRESSION: No active disease. No evidence of pneumonia or pulmonary edema. Electronically Signed   By: Bary Richard M.D.   On: 06/20/2020 22:22   CT ABDOMEN PELVIS W CONTRAST  Result Date: 06/21/2020 CLINICAL DATA:  Fall, left  femoral fracture, severe acute anemia. Assess for active extravasation. EXAM: CT ABDOMEN AND PELVIS WITH CONTRAST TECHNIQUE: Multidetector CT imaging of the abdomen and pelvis was performed using the standard protocol following bolus administration of intravenous contrast. CONTRAST:  OMNIPAQUE IOHEXOL 300 MG/ML  SOLN COMPARISON:  04/10/2020 FINDINGS: Lower chest: Mild bibasilar atelectasis. The visualized heart and pericardium are unremarkable. Hepatobiliary: Mild hepatic steatosis. No enhancing intrahepatic mass. No intra or extrahepatic biliary ductal dilation. Gallbladder unremarkable. Pancreas: Unremarkable Spleen: 7 mm enhancing lesion within the inferior pole of the spleen is unchanged possibly representing a flash fill hemangioma or an intraparenchymal aneurysm or hyperdense cyst. The spleen is otherwise unremarkable. No perisplenic fluid collections are identified. Adrenals/Urinary Tract: The adrenal glands are unremarkable. The kidneys are normal in size and position. Simple cortical cyst within the upper pole the left kidney is again noted. No intrarenal or ureteral calculi. No hydronephrosis. The  bladder is unremarkable. Stomach/Bowel: There is moderate stool noted throughout the colon and rectal vault without evidence of obstruction. The stomach, small bowel, and large bowel are otherwise unremarkable. Appendix normal. No free intraperitoneal gas or fluid. Vascular/Lymphatic: Extensive aortoiliac atherosclerotic calcification is present. Particularly prominent atherosclerotic calcification is seen at the origin of the a right renal artery resulting in a roughly 50% stenosis of this vessel at its origin. Calcified plaque results in less than 50% stenoses of the superior mesenteric artery and left renal arteries at their origins. 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 No aortic aneurysm. No pathologic adenopathy within the abdomen and pelvis.  Reproductive: Uterus and bilateral adnexa are unremarkable. Other: No retroperitoneal hematoma identified. Musculoskeletal: In acute, comminuted left femoral intratrochanteric fracture is again identified with avulsion of the lesser trochanter and impaction and varus angulation of the major fracture fragments. No dislocation. No active extravasation within the soft tissues surrounding the fracture fragments. There is intrafascial infiltration in keeping with edema surrounding the fracture fragments. The osseous structures are otherwise intact. No lytic or blastic bone lesion. IMPRESSION: No definite radiographic stores for the patient's severe anemia. No active extravasation identified within the abdomen and pelvis and surrounding the left femoral fracture fragments. No acute intra-abdominal pathology identified. Mild hepatic steatosis. Stable 7 mm enhancing lesion within the inferior pole of the spleen possibly representing a flash filled hemangioma or intraparenchymal aneurysm. Moderate stool throughout the colon and rectal vault. Peripheral vascular disease with roughly 50% stenosis of the a right renal artery at its origin. Acute, impacted, angulated, comminuted left femoral intratrochanteric fracture. Aortic Atherosclerosis (ICD10-I70.0). Electronically Signed   By: Helyn Numbers MD   On: 06/21/2020 03:06   Pelvis Portable  Result Date: 06/21/2020 CLINICAL DATA:  LEFT hip fracture. EXAM: PORTABLE PELVIS 1-2 VIEWS COMPARISON:  Plain film of the pelvis dated 06/20/2020. FINDINGS: Interval internal fixation of the LEFT femoral neck fracture. Hardware appears intact and appropriately positioned. Osseous alignment is improved. Expected postsurgical changes within the overlying soft tissues. IMPRESSION: Status post internal fixation of the LEFT femoral neck fracture. No evidence of surgical complicating feature. Electronically Signed   By: Bary Richard M.D.   On: 06/21/2020 16:05   DG C-Arm 1-60 Min  Result  Date: 06/21/2020 CLINICAL DATA:  Nailing of LEFT femur fracture EXAM: LEFT FEMUR 2 VIEWS; DG C-ARM 1-60 MIN COMPARISON:  06/20/2020 FLUOROSCOPY TIME:  0 minutes 47 seconds Images: 3 FINDINGS: Osseous demineralization. Images demonstrate placement of an IM nail with 2 compression screws across previously identified intertrochanteric fracture LEFT femur. No dislocation. IMPRESSION: Post nailing of intertrochanteric fracture LEFT femur. Electronically Signed   By: Ulyses Southward M.D.   On: 06/21/2020 15:00   DG Hip Unilat With Pelvis 2-3 Views Left  Result Date: 06/20/2020 CLINICAL DATA:  Status post mechanical fall.  Pain to LEFT hip. EXAM: DG HIP (WITH OR WITHOUT PELVIS) 2-3V LEFT COMPARISON:  None. FINDINGS: Significantly displaced/comminuted fracture of the LEFT femoral neck, with avulsion of the lesser trochanter. LEFT femoral head remains grossly well aligned relative to the acetabulum. No additional fracture is seen within the osseous pelvis. IMPRESSION: Significantly displaced/comminuted fracture of the LEFT femoral neck, with avulsion of the lesser trochanter. Electronically Signed   By: Bary Richard M.D.   On: 06/20/2020 22:22   DG FEMUR MIN 2 VIEWS LEFT  Result Date: 06/21/2020 CLINICAL DATA:  Nailing of LEFT femur fracture EXAM: LEFT FEMUR 2 VIEWS; DG C-ARM 1-60 MIN COMPARISON:  06/20/2020 FLUOROSCOPY TIME:  0 minutes 47 seconds Images: 3 FINDINGS: Osseous demineralization. Images demonstrate placement of an IM nail with 2 compression screws across previously identified intertrochanteric fracture LEFT femur. No dislocation. IMPRESSION: Post nailing of intertrochanteric fracture LEFT femur. Electronically Signed   By: Ulyses Southward M.D.   On: 06/21/2020 15:00   Scheduled Meds: . acetaminophen  500 mg Oral Q6H  . amLODipine  10 mg Oral Daily  . aspirin EC  81 mg Oral Daily  . atorvastatin  80 mg Oral Daily  . Chlorhexidine Gluconate Cloth  6 each Topical Daily  . clopidogrel  75 mg Oral Daily  .  docusate sodium  100 mg Oral BID  . insulin aspart  0-9 Units Subcutaneous TID AC & HS  . LORazepam  0.5 mg Oral QHS  . melatonin  10 mg Oral QHS  . [START ON 06/23/2020] metFORMIN  1,000 mg Oral BID WC   Continuous Infusions: . lactated ringers 75 mL/hr at 06/21/20 1818  . methocarbamol (ROBAXIN) IV       LOS: 1 day   Time spent:  Azucena Fallen, DO [facility]  If 7PM-7AM, please contact night-coverage [email or web address]  06/22/2020, 7:36 AM

## 2020-06-22 NOTE — Evaluation (Signed)
Physical Therapy Evaluation Patient Details Name: Autumn Schneider MRN: [id number] DOB: April 13, 1940 Today's Date: 06/22/2020   History of Present Illness  Autumn Schneider is a 80 y.o. female who presents by by EMS after a fall at home. Pt s/p L IM Nail due to L hip fx.  PMH: HTN, DMT2, HLD, GERD, Alzheimer's dementia    Clinical Impression  Pt admitted with above. Due to advanced dementia pt unable to actively participate in therapy despite being alert and awake. At this time pt is requiring total assist for transfers and ADLs. Pt to benefit from SNF upon d/c to achieve minA level of care as PTA. Acute PT to cont to follow.    Follow Up Recommendations SNF;Supervision/Assistance - 24 hour    Equipment Recommendations  None recommended by PT (determined at next venue)    Recommendations for Other Services       Precautions / Restrictions Precautions Precautions: Fall Precaution Comments: pt very very very confused, very fidgitiy with hands - requested activity lap belt Restrictions Weight Bearing Restrictions: Yes LLE Weight Bearing: Weight bearing as tolerated (per RN, per Dr. August Saucer)      Mobility  Bed Mobility Overal bed mobility: Needs Assistance Bed Mobility: Supine to Sit     Supine to sit: +2 for physical assistance;Total assist     General bed mobility comments: pt with initiation or comprehension of transfer, pt with retropulsion, pt requiring assist at trunk and LEs    Transfers Overall transfer level: Needs assistance Equipment used:  (2 person lift with gait belt) Transfers: Sit to/from [name] Transfers Sit to Stand: Max assist;+2 physical assistance Stand pivot transfers: Total assist;+2 physical assistance       General transfer comment: pt unable to tolerate L LE WBing, maintaining NWB, grimacing/moaning in pain  Ambulation/Gait                Stairs            Wheelchair Mobility    Modified Rankin (Stroke Patients Only)        Balance Overall balance assessment: Needs assistance Sitting-balance support: Feet supported;No upper extremity supported Sitting balance-Leahy Scale: Zero Sitting balance - Comments: pt with retropulsion   Standing balance support: Bilateral upper extremity supported;During functional activity Standing balance-Leahy Scale: Zero Standing balance comment: dependent on physical assist                             Pertinent Vitals/Pain Pain Assessment: Faces Faces Pain Scale: Hurts even more Pain Location: L LE with movement Pain Descriptors / Indicators: Grimacing;Guarding;Moaning Pain Intervention(s): Premedicated before session    Home Living Family/patient expects to be discharged to:: Skilled nursing facility Living Arrangements: Spouse/significant other               Additional Comments: pt was living at home with spouse    Prior Function Level of Independence: Needs assistance   Gait / Transfers Assistance Needed: pt able to amb without assist and did so all day long  ADL's / Homemaking Assistance Needed: dependent for all ADLs except feeding self        Hand Dominance   Dominant Hand: Right    Extremity/Trunk Assessment   Upper Extremity Assessment Upper Extremity Assessment: Generalized weakness    Lower Extremity Assessment Lower Extremity Assessment: Generalized weakness    Cervical / Trunk Assessment Cervical / Trunk Assessment: Normal  Communication   Communication: Expressive difficulties (pt with minimal conversation)  Cognition Arousal/Alertness: Awake/alert Behavior During Therapy: Flat affect Overall Cognitive Status: History of cognitive impairments - at baseline                                 General Comments: pt with advanced dementia and unable to carry on coversation, pt A&Ox1 inconsistently. pt with no recollection of fall or that she broke her L LE despite max verbal cues. Pt also to benefit from activity  belt due to pt constantly picking with bilat hands      General Comments General comments (skin integrity, edema, etc.): VSS, L LE dressing without drainage, pt soiled in urine, dependent for hygiene    Exercises     Assessment/Plan    PT Assessment Patient needs continued PT services  PT Problem List Decreased strength;Decreased range of motion;Decreased activity tolerance;Decreased balance;Decreased coordination;Decreased cognition;Decreased safety awareness       PT Treatment Interventions DME instruction;Gait training;Stair training;Functional mobility training;Therapeutic activities;Balance training;Therapeutic exercise    PT Goals (Current goals can be found in the Care Plan section)  Acute Rehab PT Goals Patient Stated Goal: didn't state PT Goal Formulation: With patient/family Time For Goal Achievement: 07/06/20 Potential to Achieve Goals: Good    Frequency Min 3X/week   Barriers to discharge Decreased caregiver support spouse unable to provide total care    Co-evaluation               AM-PAC PT "6 Clicks" Mobility  Outcome Measure Help needed turning from your back to your side while in a flat bed without using bedrails?: Total Help needed moving from lying on your back to sitting on the side of a flat bed without using bedrails?: Total Help needed moving to and from a bed to a chair (including a wheelchair)?: Total Help needed standing up from a chair using your arms (e.g., wheelchair or bedside chair)?: Total Help needed to walk in hospital room?: Total Help needed climbing 3-5 steps with a railing? : Total 6 Click Score: 6    End of Session Equipment Utilized During Treatment: Gait belt Activity Tolerance: Patient tolerated treatment well Patient left: in chair;with call bell/phone within reach;with chair alarm set;with nursing/sitter in room;with family/visitor present Nurse Communication: Mobility status (pt present) PT Visit Diagnosis: Unsteadiness  on feet (R26.81);Muscle weakness (generalized) (M62.81);Difficulty in walking, not elsewhere classified (R26.2);Other symptoms and signs involving the nervous system (R29.898)    Time: 8185-6314 PT Time Calculation (min) (ACUTE ONLY): 24 min   Charges:   PT Evaluation $PT Eval Moderate Complexity: 1 Mod PT Treatments $Therapeutic Activity: 8-22 mins        Lewis Shock, PT, DPT Acute Rehabilitation Services Pager #: [phone] Office #: [phone]   Iona Hansen 06/22/2020, 1:36 PM

## 2020-06-22 NOTE — TOC Progression Note (Signed)
Transition of Care [facility]) - Progression Note    Patient Details  Name: Autumn Schneider MRN: [id number] Date of Birth: 1940-12-01  Transition of [facility]) CM/SW Contact  Ralene Bathe, LCSWA Phone Number: 06/22/2020, 3:53 PM  Clinical Narrative:    RE: Autumn Schneider Date of Birth: August 25, 1940 Date: 06/22/2020  Please be advised that the above-named patient has a primary diagnosis of dementia which supersedes any psychiatric diagnosis. Patient will require a short-term nursing home stay - anticipated 30 days or less for rehabilitation and strengthening.  The plan is for return home.    Expected Discharge Plan: Skilled Nursing Facility Barriers to Discharge: Insurance Authorization,SNF Pending bed offer,Continued Medical Work up  Expected Discharge Plan and Services Expected Discharge Plan: Skilled Nursing Facility       Living arrangements for the past 2 months: Single Family Home                                       Social Determinants of Health (SDOH) Interventions    Readmission Risk Interventions No flowsheet data found.

## 2020-06-22 NOTE — Progress Notes (Signed)
Initial Nutrition Assessment  DOCUMENTATION CODES:   Not applicable  INTERVENTION:   -Ensure Max po daily, each supplement provides 150 kcal and 30 grams of protein.  -Magic cup TID with meals, each supplement provides 290 kcal and 9 grams of protein -MVI with minerals daily  NUTRITION DIAGNOSIS:   Increased nutrient needs related to post-op healing as evidenced by estimated needs.  GOAL:   Patient will meet greater than or equal to 90% of their needs  MONITOR:   PO intake,Supplement acceptance,Labs,Weight trends,Skin,I & O's  REASON FOR ASSESSMENT:   Malnutrition Screening Tool,Consult Assessment of nutrition requirement/status,Hip fracture protocol  ASSESSMENT:   Autumn Schneider is a 80 y.o. female with medical history significant for HTN, DMT2, HLD, GERD, Alzheimer's dementia by EMS after a fall at home.  Patient was walking to her bedroom after dinner and as she was going down the hallway and stopped to turn to go into her room she lost her balance and fell landing on her left side.  Pt admitted with lt hip fracture.   6/5- s/p IMN lt hip  Reviewed I/O's: -94 ml x 24 hours   UOP: 1.1 L x 24 hours  Spoke with pt, son, and husband at bedside. They report pt generally has a good appetite, consuming 3 meals per day (Breakfast: cereal or oatmeal; Lunch: soup or sandwich; Dinner: meat, starch, and vegetable). Since being admitted, intake has declined and pt often purses her lips when offered food. Family suspects this is related to unfamiliar environment and surroundings (pt with dementia).   Per pt son, pt UBW is around 136# and has progressively lost weight over the past 2 years secondary to dementia diagnosis. They estimate she has lost about 4-5 pounds over the past 6 months. They have tried Boost and Ensure supplements, which she does not like. She does accept Premier Protein at home.   Discussed importance of good meal and supplement intake to promote healing. Pt amenable  to Ensure Max and [employer].   Medications reviewed and include colace and ativan.  Lab Results  Component Value Date   HGBA1C 6.5 (H) 06/21/2020   PTA DM medications are 1000 mg metformin BID.   Labs reviewed: CBGS: 129-167 (inpatient orders for glycemic control are 0-9 units insulin aspart TID before meals and at bedtime and 1000 mg metformin BID).   NUTRITION - FOCUSED PHYSICAL EXAM:  Flowsheet Row Most Recent Value  Orbital Region No depletion  Upper Arm Region Mild depletion  Thoracic and Lumbar Region No depletion  Buccal Region No depletion  Temple Region No depletion  Clavicle Bone Region Mild depletion  Clavicle and Acromion Bone Region Mild depletion  Scapular Bone Region Mild depletion  Dorsal Hand No depletion  Patellar Region Mild depletion  Anterior Thigh Region Mild depletion  Posterior Calf Region Mild depletion  Edema (RD Assessment) Mild  Hair Reviewed  Eyes Reviewed  Mouth Reviewed  Skin Reviewed  Nails Reviewed       Diet Order:   Diet Order            Diet regular Room service appropriate? Yes; Fluid consistency: Thin  Diet effective now                 EDUCATION NEEDS:   Education needs have been addressed  Skin:  Skin Assessment: Skin Integrity Issues: Skin Integrity Issues:: Incisions Incisions: closed lt hip  Last BM:  06/20/20  Height:   Ht Readings from Last 1 Encounters:  04/10/20 4'  11.5" (1.511 m)    Weight:   Wt Readings from Last 1 Encounters:  04/10/20 45.4 kg    Ideal Body Weight:  45.1 kg  BMI:  There is no height or weight on file to calculate BMI.  Estimated Nutritional Needs:   Kcal:  1350-1550  Protein:  65-80 grams  Fluid:  > 1.3 L    Levada Schilling, RD, LDN, CDCES Registered Dietitian II Certified Diabetes Care and Education Specialist Please refer to [facility] for RD and/or RD on-call/weekend/after hours pager

## 2020-06-22 NOTE — NC FL2 (Signed)
[place] MEDICAID FL2 LEVEL OF CARE SCREENING TOOL     IDENTIFICATION  Patient Name: Autumm Navedo Birthdate: September 03, 1940 Sex: female Admission Date (Current Location): 06/20/2020  [facility] and [place] Number:  Producer, television/film/video and Address:  [facility] [place], [address] [place], [place] [address]      Provider Number: [id number]  Attending Physician Name and Address:  Azucena Fallen, MD  Relative Name and Phone Number:  Marciel,Ronald (Spouse)   [phone]    Current Level of Care: Hospital Recommended Level of Care: Skilled Nursing Facility Prior Approval Number:    Date Approved/Denied:   PASRR Number:    Discharge Plan: SNF    Current Diagnoses: Patient Active Problem List   Diagnosis Date Noted  . Closed hip fracture, left, initial encounter (HCC) 06/21/2020  . Diabetes mellitus type 2, controlled (HCC) 06/21/2020  . Anemia 06/21/2020  . CKD (chronic kidney disease) stage 3, GFR 30-59 ml/min (HCC) 06/21/2020  . Palliative care encounter 05/18/2018  . Fall 11/03/2014  . Diabetes mellitus type 2, uncontrolled (HCC) 11/03/2014  . Alzheimer's dementia (HCC) 11/03/2014  . Depression 11/03/2014  . Hypercholesteremia 11/03/2014  . Elevated CK 11/03/2014  . TIA (transient ischemic attack) 03/07/2011  . Hyponatremia 03/07/2011  . EOSINOPHILIA 03/13/2008  . PRURITUS 02/21/2008  . DIABETES, TYPE 2 05/17/2007  . Hypertension 05/17/2007  . EUSTACHIAN TUBE DYSFUNCTION 05/01/2007  . LARYNGITIS, ACUTE 05/01/2007  . ALLERGIC RHINITIS 05/01/2007    Orientation RESPIRATION BLADDER Height & Weight     Self    External catheter Weight:   Height:     BEHAVIORAL SYMPTOMS/MOOD NEUROLOGICAL BOWEL NUTRITION STATUS      Incontinent Diet  AMBULATORY STATUS COMMUNICATION OF NEEDS Skin   Total Care Verbally Surgical wounds                       Personal Care Assistance Level of Assistance  Bathing,Feeding,Total care,Dressing Bathing  Assistance: Maximum assistance Feeding assistance: Limited assistance Dressing Assistance: Maximum assistance Total Care Assistance: Maximum assistance   Functional Limitations Info  Sight,Hearing,Speech Sight Info: Adequate Hearing Info: Adequate Speech Info: Impaired    SPECIAL CARE FACTORS FREQUENCY  PT (By licensed PT),OT (By licensed OT)     PT Frequency: 5x/ week OT Frequency: 5x/ week            Contractures      Additional Factors Info  Code Status,Allergies,Insulin Sliding Scale Code Status Info: Full Allergies Info: Aricept (Donepezil Hcl)   Erythromycin   Insulin Sliding Scale Info: see d/c medication list       Current Medications (06/22/2020):  This is the current hospital active medication list Current Facility-Administered Medications  Medication Dose Route Frequency Provider Last Rate Last Admin  . acetaminophen (TYLENOL) tablet 325-650 mg  325-650 mg Oral Q6H PRN Magnant, Charles L, PA-C      . acetaminophen (TYLENOL) tablet 500 mg  500 mg Oral Q6H Magnant, Charles L, PA-C   500 mg at 06/22/20 2863  . amLODipine (NORVASC) tablet 10 mg  10 mg Oral Daily Chotiner, Claudean Severance, MD   10 mg at 06/22/20 0954  . aspirin EC tablet 81 mg  81 mg Oral Daily Magnant, Charles L, PA-C   81 mg at 06/22/20 0955  . atorvastatin (LIPITOR) tablet 80 mg  80 mg Oral Daily Chotiner, Claudean Severance, MD   80 mg at 06/22/20 0955  . Chlorhexidine Gluconate Cloth 2 % PADS 6 each  6  each Topical Daily Azucena Fallen, MD   6 each at 06/22/20 [phone]  . clopidogrel (PLAVIX) tablet 75 mg  75 mg Oral Daily Silvana Newness, RPH   75 mg at 06/22/20 0955  . docusate sodium (COLACE) capsule 100 mg  100 mg Oral BID Magnant, Charles L, PA-C   100 mg at 06/22/20 0954  . HYDROcodone-acetaminophen (NORCO/VICODIN) 5-325 MG per tablet 1-2 tablet  1-2 tablet Oral Q4H PRN Magnant, Charles L, PA-C      . HYDROmorphone (DILAUDID) injection 0.5 mg  0.5 mg Intravenous Q2H PRN Chotiner, Claudean Severance, MD      .  insulin aspart (novoLOG) injection 0-9 Units  0-9 Units Subcutaneous TID AC & HS Azucena Fallen, MD   1 Units at 06/22/20 [phone]  . LORazepam (ATIVAN) tablet 0.5 mg  0.5 mg Oral QHS Chotiner, Claudean Severance, MD      . melatonin tablet 10 mg  10 mg Oral QHS Chotiner, Claudean Severance, MD      . menthol-cetylpyridinium (CEPACOL) lozenge 3 mg  1 lozenge Oral PRN Magnant, Charles L, PA-C       Or  . phenol (CHLORASEPTIC) mouth spray 1 spray  1 spray Mouth/Throat PRN Magnant, Charles L, PA-C      . [START ON 06/23/2020] metFORMIN (GLUCOPHAGE) tablet 1,000 mg  1,000 mg Oral BID WC Chotiner, Claudean Severance, MD      . methocarbamol (ROBAXIN) tablet 500 mg  500 mg Oral Q6H PRN Magnant, Charles L, PA-C       Or  . methocarbamol (ROBAXIN) 500 mg in dextrose 5 % 50 mL IVPB  500 mg Intravenous Q6H PRN Magnant, Charles L, PA-C      . metoCLOPramide (REGLAN) tablet 5-10 mg  5-10 mg Oral Q8H PRN Magnant, Charles L, PA-C       Or  . metoCLOPramide (REGLAN) injection 5-10 mg  5-10 mg Intravenous Q8H PRN Magnant, Charles L, PA-C      . morphine 2 MG/ML injection 0.5-1 mg  0.5-1 mg Intravenous Q2H PRN Magnant, Charles L, PA-C      . ondansetron (ZOFRAN) tablet 4 mg  4 mg Oral Q6H PRN Magnant, Charles L, PA-C       Or  . ondansetron (ZOFRAN) injection 4 mg  4 mg Intravenous Q6H PRN Magnant, Charles L, PA-C      . senna-docusate (Senokot-S) tablet 1 tablet  1 tablet Oral QHS PRN Chotiner, Claudean Severance, MD         Discharge Medications: Please see discharge summary for a list of discharge medications.  Relevant Imaging Results:  Relevant Lab Results:   Additional Information SSN:  [id number]  [employer] Lashae Wollenberg, LCSWA

## 2020-06-23 LAB — BASIC METABOLIC PANEL
Anion gap: 7 (ref 5–15)
BUN: 16 mg/dL (ref 8–23)
CO2: 28 mmol/L (ref 22–32)
Calcium: 8.7 mg/dL — ABNORMAL LOW (ref 8.9–10.3)
Chloride: 100 mmol/L (ref 98–111)
Creatinine, Ser: 0.89 mg/dL (ref 0.44–1.00)
GFR, Estimated: >60 mL/min (ref >60)
Glucose, Bld: 137 mg/dL — ABNORMAL HIGH (ref 70–99)
Potassium: 3.4 mmol/L — ABNORMAL LOW (ref 3.5–5.1)
Sodium: 135 mmol/L (ref 135–145)

## 2020-06-23 LAB — CBC
HCT: 37.5 % (ref 36.0–46.0)
Hemoglobin: 12.8 g/dL (ref 12.0–15.0)
MCH: 30.4 pg (ref 26.0–34.0)
MCHC: 34.1 g/dL (ref 30.0–36.0)
MCV: 89.1 fL (ref 80.0–100.0)
Platelets: 191 10*3/uL (ref 150–400)
RBC: 4.21 MIL/uL (ref 3.87–5.11)
RDW: 13 % (ref 11.5–15.5)
WBC: 9.5 10*3/uL (ref 4.0–10.5)
nRBC: 0 % (ref 0.0–0.2)

## 2020-06-23 LAB — GLUCOSE, CAPILLARY
Glucose-Capillary: 164 mg/dL — ABNORMAL HIGH (ref 70–99)
Glucose-Capillary: 182 mg/dL — ABNORMAL HIGH (ref 70–99)
Glucose-Capillary: 183 mg/dL — ABNORMAL HIGH (ref 70–99)
Glucose-Capillary: 190 mg/dL — ABNORMAL HIGH (ref 70–99)
Glucose-Capillary: 247 mg/dL — ABNORMAL HIGH (ref 70–99)

## 2020-06-23 NOTE — Progress Notes (Signed)
Patient stable Minimal pain with left leg range of motion Continue nonweightbearing but okay for touchdown weightbearing for transfers Follow-up in 2 weeks for suture removal radiographs and initiation of full weightbearing at that time

## 2020-06-23 NOTE — Progress Notes (Signed)
PROGRESS NOTE    Autumn Schneider  ZOX:[id number] DOB: 09/22/1940 DOA: 06/20/2020 PCP: Autumn BrunetteSmith, Candace, Schneider   Brief Narrative:  Autumn Schneider is a 80 y.o. female with medical history significant for HTN, DMT2, HLD, GERD, Alzheimer's dementia by EMS after a fall at home.  Patient was walking to her bedroom after dinner and as she was going down the hallway and stopped to turn to go into her room she lost her balance and fell landing on her left side. She is unable to provide any history herself due to baseline mental status and dementia.  Left femoral neck fracture noted on imaging Orthopedic was consulted by the ER physician.  Hospitalist service has been asked to admit for further management  Assessment & Plan:   Principal Problem:   Closed hip fracture, left, initial encounter [facility](HCC) Active Problems:   Hypertension   Alzheimer's dementia (HCC)   Diabetes mellitus type 2, controlled (HCC)   Anemia   CKD (chronic kidney disease) stage 3, GFR 30-59 ml/min (HCC)  Closed hip fracture, left -Orthopedic surgery following - IM nail placed 06/21/20 - patient tolerated well -Pain currently well controlled  -PT OT ongoing but non weight bearing for 10-14 days per Ortho - Dispo likely SNF in 24-48 pending eval/surgical clearance for DC  Essential hypertension Continue home amlodipine  Diabetes mellitus type 2, non-insulin-dependent, well controlled -Continue diabetic diet and ACHS glucose monitoring postoperatively  -A1c 6.6  Macrocytosis without anemia, POA -Initial hemoglobin 5.2 at intake, unlikely accurate given repeat hemoglobin for verification was 9.9 -unfortunately patient already had received 1 unit PRBC in the interim -Repeat Hgb this morning 11.9  CKD 3A -Discontinue IV fluids now tolerating p.o. back at baseline -Appears to be at baseline  Alzheimer's dementia  -Chronic, somewhat advanced, alert to person only this morning son at bedside states this is about typical for her  baseline -Discontinued benzo/hydroxyzine at intake given patient age/dementia - contraindicated drug classes for her  DVT prophylaxis:  SCDs perioperatively, asa/plavix per ortho Code Status:   DNR  Family Communication: Husband at bedside  Status is: Inpatient  Dispo: The patient is from: Home              Anticipated d/c is to: To be determined, likely SNF pending PT evaluation and postoperative care              Anticipated d/c date is: 48 to 72 hours              Patient currently not medically stable for discharge  Consultants:   Orthopedic surgery  Procedures:   Left hip fracture repair 06/21/2020  Antimicrobials:  None  Subjective: No acute issues or events overnight, review of systems markedly limited given patient's mental status and baseline; somewhat poor p.o. intake and medication compliance due to mental status, family assisting at bedside which is much appreciated  Objective: Vitals:   06/22/20 1500 06/22/20 1954 06/23/20 0431 06/23/20 0700  BP: (!) 143/86 (!) 132/91 135/72 (!) 141/81  Pulse: 83 84 85 83  Resp: 17 16 18 18   Temp: 98 F (36.7 C)  98.9 F (37.2 C) 98.2 F (36.8 C)  TempSrc: Axillary  Oral Oral  SpO2: 96% 97% 98% 98%    Intake/Output Summary (Last 24 hours) at 06/23/2020 0749 Last data filed at 06/22/2020 1200 Gross per 24 hour  Intake 120 ml  Output --  Net 120 ml   There were no vitals filed for this visit.  Examination:  General:  Pleasantly resting in bed, No acute distress.  Somewhat cachectic in appearance HEENT:  Normocephalic atraumatic.  Sclerae nonicteric, noninjected.  Extraocular movements intact bilaterally. Neck:  Without mass or deformity.  Trachea is midline. Lungs:  Clear to auscultate bilaterally without rhonchi, wheeze, or rales. Heart:  Regular rate and rhythm.  Without murmurs, rubs, or gallops. Abdomen:  Soft, nontender, nondistended.  Without guarding or rebound. Extremities: Without cyanosis, clubbing,  edema. Vascular:  Dorsalis pedis and posterior tibial pulses palpable bilaterally. Skin:  Warm and dry, no erythema, no ulcerations.  Data Reviewed: I have personally reviewed following labs and imaging studies  CBC: Recent Labs  Lab 06/21/20 0020 06/21/20 0118 06/21/20 0731 06/22/20 0222 06/23/20 0210  WBC 5.9  --  9.6 9.0 9.5  NEUTROABS 5.2  --   --   --   --   HGB 5.2* 9.9* 13.0 11.9* 12.8  HCT 17.1* 30.8* 38.0 35.5* 37.5  MCV 100.6*  --  89.0 89.9 89.1  PLT 131*  --  211 206 191   Basic Metabolic Panel: Recent Labs  Lab 06/21/20 0118 06/22/20 0222 06/23/20 0210  NA 138 136 135  K 5.3* 4.4 3.4*  CL 107 102 100  CO2 28 26 28   GLUCOSE 181* 121* 137*  BUN 31* 19 16  CREATININE 1.13* 0.95 0.89  CALCIUM 9.1 8.4* 8.7*   GFR: CrCl cannot be calculated (Unknown ideal weight.). Liver Function Tests: No results for input(s): AST, ALT, ALKPHOS, BILITOT, PROT, ALBUMIN in the last 168 hours. No results for input(s): LIPASE, AMYLASE in the last 168 hours. No results for input(s): AMMONIA in the last 168 hours. Coagulation Profile: Recent Labs  Lab 06/21/20 0020  INR 1.0   Cardiac Enzymes: No results for input(s): CKTOTAL, CKMB, CKMBINDEX, TROPONINI in the last 168 hours. BNP (last 3 results) No results for input(s): PROBNP in the last 8760 hours. HbA1C: Recent Labs    06/21/20 0647  HGBA1C 6.5*   CBG: Recent Labs  Lab 06/22/20 0639 06/22/20 1231 06/22/20 1601 06/22/20 1927 06/23/20 0434  GLUCAP 129* 164* 179* 205* 164*   Lipid Profile: No results for input(s): CHOL, HDL, LDLCALC, TRIG, CHOLHDL, LDLDIRECT in the last 72 hours. Thyroid Function Tests: No results for input(s): TSH, T4TOTAL, FREET4, T3FREE, THYROIDAB in the last 72 hours. Anemia Panel: No results for input(s): VITAMINB12, FOLATE, FERRITIN, TIBC, IRON, RETICCTPCT in the last 72 hours. Sepsis Labs: No results for input(s): PROCALCITON, LATICACIDVEN in the last 168 hours.  Recent Results  (from the past 240 hour(s))  Resp Panel by RT-PCR (Flu A&B, Covid) Nasopharyngeal Swab     Status: None   Collection Time: 06/21/20 12:10 AM   Specimen: Nasopharyngeal Swab; Nasopharyngeal(NP) swabs in vial transport medium  Result Value Ref Range Status   SARS Coronavirus 2 by RT PCR NEGATIVE NEGATIVE Final    Comment: (NOTE) SARS-CoV-2 target nucleic acids are NOT DETECTED.  The SARS-CoV-2 RNA is generally detectable in upper respiratory specimens during the acute phase of infection. The lowest concentration of SARS-CoV-2 viral copies this assay can detect is 138 copies/mL. A negative result does not preclude SARS-Cov-2 infection and should not be used as the sole basis for treatment or other patient management decisions. A negative result may occur with  improper specimen collection/handling, submission of specimen other than nasopharyngeal swab, presence of viral mutation(s) within the areas targeted by this assay, and inadequate number of viral copies(<138 copies/mL). A negative result must be combined with clinical observations, patient history, and epidemiological information.  The expected result is Negative.  Fact Sheet for Patients:  BloggerCourse.com  Fact Sheet for Healthcare Providers:  SeriousBroker.it  This test is no t yet approved or cleared by the [place] FDA and  has been authorized for detection and/or diagnosis of SARS-CoV-2 by FDA under an Emergency Use Authorization (EUA). This EUA will remain  in effect (meaning this test can be used) for the duration of the COVID-19 declaration under Section 564(b)(1) of the Act, 21 U.S.C.section 360bbb-3(b)(1), unless the authorization is terminated  or revoked sooner.       Influenza A by PCR NEGATIVE NEGATIVE Final   Influenza B by PCR NEGATIVE NEGATIVE Final    Comment: (NOTE) The Xpert Xpress SARS-CoV-2/FLU/RSV plus assay is intended as an aid in the  diagnosis of influenza from Nasopharyngeal swab specimens and should not be used as a sole basis for treatment. Nasal washings and aspirates are unacceptable for Xpert Xpress SARS-CoV-2/FLU/RSV testing.  Fact Sheet for Patients: BloggerCourse.com  Fact Sheet for Healthcare Providers: SeriousBroker.it  This test is not yet approved or cleared by the [place] FDA and has been authorized for detection and/or diagnosis of SARS-CoV-2 by FDA under an Emergency Use Authorization (EUA). This EUA will remain in effect (meaning this test can be used) for the duration of the COVID-19 declaration under Section 564(b)(1) of the Act, 21 U.S.C. section 360bbb-3(b)(1), unless the authorization is terminated or revoked.  Performed at [facility], [address] [place], [place] [address]   Surgical pcr screen     Status: None   Collection Time: 06/21/20  6:04 AM   Specimen: Nasal Mucosa; Nasal Swab  Result Value Ref Range Status   MRSA, PCR NEGATIVE NEGATIVE Final   Staphylococcus aureus NEGATIVE NEGATIVE Final    Comment: (NOTE) The Xpert SA Assay (FDA approved for NASAL specimens in patients 67 years of age and older), is one component of a comprehensive surveillance program. It is not intended to diagnose infection nor to guide or monitor treatment. Performed at [facility], [address], [place], [place] [address]          Radiology Studies: Pelvis Portable  Result Date: 06/21/2020 CLINICAL DATA:  LEFT hip fracture. EXAM: PORTABLE PELVIS 1-2 VIEWS COMPARISON:  Plain film of the pelvis dated 06/20/2020. FINDINGS: Interval internal fixation of the LEFT femoral neck fracture. Hardware appears intact and appropriately positioned. Osseous alignment is improved. Expected postsurgical changes within the overlying soft tissues. IMPRESSION: Status post internal fixation of the LEFT femoral neck fracture. No evidence of  surgical complicating feature. Electronically Signed   By: Bary Richard M.D.   On: 06/21/2020 16:05   DG C-Arm 1-60 Min  Result Date: 06/21/2020 CLINICAL DATA:  Nailing of LEFT femur fracture EXAM: LEFT FEMUR 2 VIEWS; DG C-ARM 1-60 MIN COMPARISON:  06/20/2020 FLUOROSCOPY TIME:  0 minutes 47 seconds Images: 3 FINDINGS: Osseous demineralization. Images demonstrate placement of an IM nail with 2 compression screws across previously identified intertrochanteric fracture LEFT femur. No dislocation. IMPRESSION: Post nailing of intertrochanteric fracture LEFT femur. Electronically Signed   By: Ulyses Southward M.D.   On: 06/21/2020 15:00   DG FEMUR MIN 2 VIEWS LEFT  Result Date: 06/21/2020 CLINICAL DATA:  Nailing of LEFT femur fracture EXAM: LEFT FEMUR 2 VIEWS; DG C-ARM 1-60 MIN COMPARISON:  06/20/2020 FLUOROSCOPY TIME:  0 minutes 47 seconds Images: 3 FINDINGS: Osseous demineralization. Images demonstrate placement of an IM nail with 2 compression screws across previously identified intertrochanteric fracture  LEFT femur. No dislocation. IMPRESSION: Post nailing of intertrochanteric fracture LEFT femur. Electronically Signed   By: Ulyses Southward M.D.   On: 06/21/2020 15:00   Scheduled Meds: . amLODipine  10 mg Oral Daily  . aspirin EC  81 mg Oral Daily  . atorvastatin  80 mg Oral Daily  . Chlorhexidine Gluconate Cloth  6 each Topical Daily  . clopidogrel  75 mg Oral Daily  . docusate sodium  100 mg Oral BID  . insulin aspart  0-9 Units Subcutaneous TID AC & HS  . LORazepam  0.5 mg Oral QHS  . melatonin  10 mg Oral QHS  . metFORMIN  1,000 mg Oral BID WC  . multivitamin with minerals  1 tablet Oral Daily  . Ensure Max Protein  11 oz Oral QHS   Continuous Infusions: . methocarbamol (ROBAXIN) IV       LOS: 2 days   Time spent:  Azucena Fallen, DO [facility]  If 7PM-7AM, please contact night-coverage [email or web address]  06/23/2020, 7:49 AM

## 2020-06-23 NOTE — Plan of Care (Signed)

## 2020-06-23 NOTE — TOC Progression Note (Addendum)
Transition of Care [facility]) - Progression Note    Patient Details  Name: Tylah Matty MRN: [id number] Date of Birth: [birth year]  [facility] CM/SW Contact  Ralene Bathe, LCSWA Phone Number: 06/23/2020, 11:23 AM  Clinical Narrative:    CSW sent clinicals to [facility] in an attempt to receive a PASSR number for the patient to attend rehab at a SNF.  Pending:  MD signature on Dementia Note.  17:25- Signed Dementia note uploaded to [facility]. Pending PASSR number.  Expected Discharge Plan: Skilled Nursing Facility Barriers to Discharge: Insurance Authorization,SNF Pending bed offer,Continued Medical Work up  Expected Discharge Plan and Services Expected Discharge Plan: Skilled Nursing Facility       Living arrangements for the past 2 months: Single Family Home                                       Social Determinants of Health (SDOH) Interventions    Readmission Risk Interventions No flowsheet data found.

## 2020-06-24 LAB — BASIC METABOLIC PANEL
Anion gap: 8 (ref 5–15)
BUN: 23 mg/dL (ref 8–23)
CO2: 25 mmol/L (ref 22–32)
Calcium: 8.3 mg/dL — ABNORMAL LOW (ref 8.9–10.3)
Chloride: 102 mmol/L (ref 98–111)
Creatinine, Ser: 0.91 mg/dL (ref 0.44–1.00)
GFR, Estimated: >60 mL/min (ref >60)
Glucose, Bld: 155 mg/dL — ABNORMAL HIGH (ref 70–99)
Potassium: 3.7 mmol/L (ref 3.5–5.1)
Sodium: 135 mmol/L (ref 135–145)

## 2020-06-24 LAB — CBC
HCT: 35.3 % — ABNORMAL LOW (ref 36.0–46.0)
Hemoglobin: 11.8 g/dL — ABNORMAL LOW (ref 12.0–15.0)
MCH: 29.9 pg (ref 26.0–34.0)
MCHC: 33.4 g/dL (ref 30.0–36.0)
MCV: 89.6 fL (ref 80.0–100.0)
Platelets: 200 10*3/uL (ref 150–400)
RBC: 3.94 MIL/uL (ref 3.87–5.11)
RDW: 12.9 % (ref 11.5–15.5)
WBC: 9.2 10*3/uL (ref 4.0–10.5)
nRBC: 0 % (ref 0.0–0.2)

## 2020-06-24 LAB — GLUCOSE, CAPILLARY
Glucose-Capillary: 150 mg/dL — ABNORMAL HIGH (ref 70–99)
Glucose-Capillary: 273 mg/dL — ABNORMAL HIGH (ref 70–99)
Glucose-Capillary: 218 mg/dL — ABNORMAL HIGH (ref 70–99)
Glucose-Capillary: 178 mg/dL — ABNORMAL HIGH (ref 70–99)

## 2020-06-24 NOTE — Progress Notes (Signed)
  Subjective: Autumn Schneider is a 80 y.o. female s/p left hip IM nail.  They are POD 3.  Pt's pain is controlled.  Patient unable to give history due to her dementia.  Her husband is here and states she remains comfortable and he has no concerns at this time..    Objective: Vital signs in last 24 hours: Temp:  [97.7 F (36.5 C)-98.8 F (37.1 C)] 97.7 F (36.5 C) (06/08 0424) Pulse Rate:  [77-88] 85 (06/08 0424) Resp:  [17-18] 18 (06/08 0424) BP: (133-160)/(67-78) 133/74 (06/08 0424) SpO2:  [97 %-100 %] 99 % (06/08 0424)  Intake/Output from previous day: 06/07 0701 - 06/08 0700 In: 660 [P.O.:660] Out: 700 [Urine:700] Intake/Output this shift: No intake/output data recorded.  Exam:  No gross blood or drainage overlying the dressing Left foot warm and well-perfused Able to dorsiflex and plantarflex the left foot No calf tenderness   Labs: Recent Labs    06/22/20 0222 06/23/20 0210 06/24/20 0428  HGB 11.9* 12.8 11.8*   Recent Labs    06/23/20 0210 06/24/20 0428  WBC 9.5 9.2  RBC 4.21 3.94  HCT 37.5 35.3*  PLT 191 200   Recent Labs    06/23/20 0210 06/24/20 0428  NA 135 135  K 3.4* 3.7  CL 100 102  CO2 28 25  BUN 16 23  CREATININE 0.89 0.91  GLUCOSE 137* 155*  CALCIUM 8.7* 8.3*   No results for input(s): LABPT, INR in the last 72 hours.  Assessment/Plan: Pt is POD 3 s/p left hip IM nail.    -Disposition pending medical team clearance and decision  -Nonweightbearing to left lower extremity  -DVT Prophylaxis: Aspirin/Plavix and SCDs  -Follow-up with Dr. August Saucer in clinic 10 to 14 days postop.     Kalvin Buss L Angely Dietz 06/24/2020, 8:49 AM

## 2020-06-24 NOTE — Progress Notes (Addendum)
PASRR received, [id number] E.SNF bed offers shared with pt's spouse. Husband accepted bed offer from [facility].Insurance authorization pending. Gae Gallop RN,BSN,CM [phone]  06/24/2020 @ 1651  [place]  Ref. # [id number]  06/25/2020 Auth ID [id number], approved  x 5 DAYS  6/09-23-11. Cm - Earlene Plater

## 2020-06-24 NOTE — Progress Notes (Signed)
Nutrition Follow-up  DOCUMENTATION CODES:   Not applicable  INTERVENTION:   -Continue Ensure Max po daily, each supplement provides 150 kcal and 30 grams of protein.  -Continue Magic cup TID with meals, each supplement provides 290 kcal and 9 grams of protein -Continue MVI with minerals daily  NUTRITION DIAGNOSIS:   Increased nutrient needs related to post-op healing as evidenced by estimated needs.  Ongoing  GOAL:   Patient will meet greater than or equal to 90% of their needs  Progressing   MONITOR:   PO intake,Supplement acceptance,Labs,Weight trends,Skin,I & O's  REASON FOR ASSESSMENT:   Malnutrition Screening Tool,Consult Assessment of nutrition requirement/status,Hip fracture protocol  ASSESSMENT:   Autumn Schneider is a 80 y.o. female with medical history significant for HTN, DMT2, HLD, GERD, Alzheimer's dementia by EMS after a fall at home.  Patient was walking to her bedroom after dinner and as she was going down the hallway and stopped to turn to go into her room she lost her balance and fell landing on her left side.  6/5- s/p IMN lt hip  Reviewed I/O's: +200 ml x 24 hours and +226 ml since admission  Spoke with pt and sister at bedside. Pt is much more alert in comparison to previous visit. Pt sister confirms her appetite has improved. Noted pt consumed 90% of breakfast this morning. Documented meal completions 25-90%. Pt has been consuming Media planner Max.   Per TOC note, awaiting SNF placement for discharge.   Labs reviewed: CBGS: 150-273 (inpatient orders for glycemic control are 0-9 units insulin aspart TID with meals and at bedtime and 100 mg metformin BID).   Diet Order:   Diet Order            Diet regular Room service appropriate? Yes; Fluid consistency: Thin  Diet effective now                 EDUCATION NEEDS:   Education needs have been addressed  Skin:  Skin Assessment: Skin Integrity Issues: Skin Integrity Issues::  Incisions Incisions: closed lt hip  Last BM:  06/20/20  Height:   Ht Readings from Last 1 Encounters:  04/10/20 4' 11.5" (1.511 m)    Weight:   Wt Readings from Last 1 Encounters:  04/10/20 45.4 kg    Ideal Body Weight:  45.1 kg  BMI:  There is no height or weight on file to calculate BMI.  Estimated Nutritional Needs:   Kcal:  1350-1550  Protein:  65-80 grams  Fluid:  > 1.3 L    Levada Schilling, RD, LDN, CDCES Registered Dietitian II Certified Diabetes Care and Education Specialist Please refer to [facility] for RD and/or RD on-call/weekend/after hours pager

## 2020-06-24 NOTE — Progress Notes (Signed)
PROGRESS NOTE    Autumn Schneider  ZYY:[id number] DOB: August 25, 1940 DOA: 06/20/2020 PCP: Merri Brunette, MD   Brief Narrative:  Autumn Schneider is a 80 y.o. female with medical history significant for HTN, DMT2, HLD, GERD, Alzheimer's dementia by EMS after a fall at home.  Patient was walking to her bedroom after dinner and as she was going down the hallway and stopped to turn to go into her room she lost her balance and fell landing on her left side. She is unable to provide any history herself due to baseline mental status and dementia.  Left femoral neck fracture noted on imaging Orthopedic was consulted by the ER physician.  Hospitalist service has been asked to admit for further management  Assessment & Plan:   Principal Problem:   Closed hip fracture, left, initial encounter [facility]) Active Problems:   Hypertension   Alzheimer's dementia (HCC)   Diabetes mellitus type 2, controlled (HCC)   Anemia   CKD (chronic kidney disease) stage 3, GFR 30-59 ml/min (HCC)  Closed hip fracture, left -Orthopedic surgery following - IM nail placed 06/21/20 - patient tolerated well -Pain currently well controlled  -PT OT ongoing; "Continue nonweightbearing but okay for touchdown weightbearing for transfers" for 10-14 days per Ortho - Dispo likely SNF in 24-48 pending eval/surgical clearance for DC  Essential hypertension Continue home amlodipine  Diabetes mellitus type 2, non-insulin-dependent, well controlled -Continue diabetic diet and ACHS glucose monitoring postoperatively  -A1c 6.6  Macrocytosis without anemia, POA -Initial hemoglobin 5.2 at intake, unlikely accurate given repeat hemoglobin for verification was 9.9 -unfortunately patient already had received 1 unit PRBC in the interim -Repeat Hgb this morning 11.8  CKD 3A -Discontinue IV fluids now tolerating p.o. back at baseline -Appears to be at baseline  Alzheimer's dementia  -Chronic, somewhat advanced, alert to person only this morning son at  bedside states this is about typical for her baseline -Discontinued benzo/hydroxyzine at intake given patient age/dementia - contraindicated drug classes for her  DVT prophylaxis:  SCDs perioperatively, asa/plavix per ortho Code Status:   DNR  Family Communication: Husband at bedside  Status is: Inpatient  Dispo: The patient is from: Home              Anticipated d/c is to: To be determined, likely SNF pending PT evaluation and postoperative care              Anticipated d/c date is: 48 to 72 hours              Patient currently not medically stable for discharge  Consultants:   Orthopedic surgery  Procedures:   Left hip fracture repair 06/21/2020  Antimicrobials:  None  Subjective: No acute issues or events overnight, review of systems markedly limited given patient's mental status and baseline; somewhat poor p.o. intake and medication compliance due to mental status, family assisting at bedside which is much appreciated  Objective: Vitals:   06/23/20 0700 06/23/20 1500 06/23/20 1900 06/24/20 0424  BP: (!) 141/81 139/67 (!) 160/78 133/74  Pulse: 83 88 77 85  Resp: 18 17 18 18   Temp: 98.2 F (36.8 C) 98.6 F (37 C) 98.8 F (37.1 C) 97.7 F (36.5 C)  TempSrc: Oral Oral Oral Axillary  SpO2: 98% 97% 100% 99%    Intake/Output Summary (Last 24 hours) at 06/24/2020 0751 Last data filed at 06/24/2020 0400 Gross per 24 hour  Intake 660 ml  Output 700 ml  Net -40 ml   There were no vitals filed  for this visit.  Examination:  General:  Pleasantly resting in bed, No acute distress.  Somewhat cachectic in appearance HEENT:  Normocephalic atraumatic.  Sclerae nonicteric, noninjected.  Extraocular movements intact bilaterally. Neck:  Without mass or deformity.  Trachea is midline. Lungs:  Clear to auscultate bilaterally without rhonchi, wheeze, or rales. Heart:  Regular rate and rhythm.  Without murmurs, rubs, or gallops. Abdomen:  Soft, nontender, nondistended.  Without  guarding or rebound. Extremities: Without cyanosis, clubbing, edema. Vascular:  Dorsalis pedis and posterior tibial pulses palpable bilaterally. Skin:  Warm and dry, no erythema, no ulcerations.  Data Reviewed: I have personally reviewed following labs and imaging studies  CBC: Recent Labs  Lab 06/21/20 0020 06/21/20 0118 06/21/20 0731 06/22/20 0222 06/23/20 0210 06/24/20 0428  WBC 5.9  --  9.6 9.0 9.5 9.2  NEUTROABS 5.2  --   --   --   --   --   HGB 5.2* 9.9* 13.0 11.9* 12.8 11.8*  HCT 17.1* 30.8* 38.0 35.5* 37.5 35.3*  MCV 100.6*  --  89.0 89.9 89.1 89.6  PLT 131*  --  211 206 191 200   Basic Metabolic Panel: Recent Labs  Lab 06/21/20 0118 06/22/20 0222 06/23/20 0210 06/24/20 0428  NA 138 136 135 135  K 5.3* 4.4 3.4* 3.7  CL 107 102 100 102  CO2 28 26 28 25   GLUCOSE 181* 121* 137* 155*  BUN 31* 19 16 23   CREATININE 1.13* 0.95 0.89 0.91  CALCIUM 9.1 8.4* 8.7* 8.3*   GFR: CrCl cannot be calculated (Unknown ideal weight.). Liver Function Tests: No results for input(s): AST, ALT, ALKPHOS, BILITOT, PROT, ALBUMIN in the last 168 hours. No results for input(s): LIPASE, AMYLASE in the last 168 hours. No results for input(s): AMMONIA in the last 168 hours. Coagulation Profile: Recent Labs  Lab 06/21/20 0020  INR 1.0   Cardiac Enzymes: No results for input(s): CKTOTAL, CKMB, CKMBINDEX, TROPONINI in the last 168 hours. BNP (last 3 results) No results for input(s): PROBNP in the last 8760 hours. HbA1C: No results for input(s): HGBA1C in the last 72 hours. CBG: Recent Labs  Lab 06/23/20 0804 06/23/20 1255 06/23/20 1500 06/23/20 2046 06/24/20 0742  GLUCAP 182* 183* 190* 247* 150*   Lipid Profile: No results for input(s): CHOL, HDL, LDLCALC, TRIG, CHOLHDL, LDLDIRECT in the last 72 hours. Thyroid Function Tests: No results for input(s): TSH, T4TOTAL, FREET4, T3FREE, THYROIDAB in the last 72 hours. Anemia Panel: No results for input(s): VITAMINB12, FOLATE,  FERRITIN, TIBC, IRON, RETICCTPCT in the last 72 hours. Sepsis Labs: No results for input(s): PROCALCITON, LATICACIDVEN in the last 168 hours.  Recent Results (from the past 240 hour(s))  Resp Panel by RT-PCR (Flu A&B, Covid) Nasopharyngeal Swab     Status: None   Collection Time: 06/21/20 12:10 AM   Specimen: Nasopharyngeal Swab; Nasopharyngeal(NP) swabs in vial transport medium  Result Value Ref Range Status   SARS Coronavirus 2 by RT PCR NEGATIVE NEGATIVE Final    Comment: (NOTE) SARS-CoV-2 target nucleic acids are NOT DETECTED.  The SARS-CoV-2 RNA is generally detectable in upper respiratory specimens during the acute phase of infection. The lowest concentration of SARS-CoV-2 viral copies this assay can detect is 138 copies/mL. A negative result does not preclude SARS-Cov-2 infection and should not be used as the sole basis for treatment or other patient management decisions. A negative result may occur with  improper specimen collection/handling, submission of specimen other than nasopharyngeal swab, presence of viral mutation(s) within the  areas targeted by this assay, and inadequate number of viral copies(<138 copies/mL). A negative result must be combined with clinical observations, patient history, and epidemiological information. The expected result is Negative.  Fact Sheet for Patients:  BloggerCourse.com  Fact Sheet for Healthcare Providers:  SeriousBroker.it  This test is no t yet approved or cleared by the [place] FDA and  has been authorized for detection and/or diagnosis of SARS-CoV-2 by FDA under an Emergency Use Authorization (EUA). This EUA will remain  in effect (meaning this test can be used) for the duration of the COVID-19 declaration under Section 564(b)(1) of the Act, 21 U.S.C.section 360bbb-3(b)(1), unless the authorization is terminated  or revoked sooner.       Influenza A by PCR NEGATIVE  NEGATIVE Final   Influenza B by PCR NEGATIVE NEGATIVE Final    Comment: (NOTE) The Xpert Xpress SARS-CoV-2/FLU/RSV plus assay is intended as an aid in the diagnosis of influenza from Nasopharyngeal swab specimens and should not be used as a sole basis for treatment. Nasal washings and aspirates are unacceptable for Xpert Xpress SARS-CoV-2/FLU/RSV testing.  Fact Sheet for Patients: BloggerCourse.com  Fact Sheet for Healthcare Providers: SeriousBroker.it  This test is not yet approved or cleared by the [place] FDA and has been authorized for detection and/or diagnosis of SARS-CoV-2 by FDA under an Emergency Use Authorization (EUA). This EUA will remain in effect (meaning this test can be used) for the duration of the COVID-19 declaration under Section 564(b)(1) of the Act, 21 U.S.C. section 360bbb-3(b)(1), unless the authorization is terminated or revoked.  Performed at [facility], [address] [place], [place] [address]   Surgical pcr screen     Status: None   Collection Time: 06/21/20  6:04 AM   Specimen: Nasal Mucosa; Nasal Swab  Result Value Ref Range Status   MRSA, PCR NEGATIVE NEGATIVE Final   Staphylococcus aureus NEGATIVE NEGATIVE Final    Comment: (NOTE) The Xpert SA Assay (FDA approved for NASAL specimens in patients 44 years of age and older), is one component of a comprehensive surveillance program. It is not intended to diagnose infection nor to guide or monitor treatment. Performed at [facility], [address], [place], [place] [address]          Radiology Studies: No results found. Scheduled Meds: . amLODipine  10 mg Oral Daily  . aspirin EC  81 mg Oral Daily  . atorvastatin  80 mg Oral Daily  . Chlorhexidine Gluconate Cloth  6 each Topical Daily  . clopidogrel  75 mg Oral Daily  . docusate sodium  100 mg Oral BID  . insulin aspart  0-9 Units Subcutaneous TID AC & HS  .  melatonin  10 mg Oral QHS  . metFORMIN  1,000 mg Oral BID WC  . multivitamin with minerals  1 tablet Oral Daily  . Ensure Max Protein  11 oz Oral QHS   Continuous Infusions: . methocarbamol (ROBAXIN) IV       LOS: 3 days   Time spent:  Azucena Fallen, DO [facility]  If 7PM-7AM, please contact night-coverage [email or web address]  06/24/2020, 7:51 AM

## 2020-06-24 NOTE — Progress Notes (Signed)
Physical Therapy Treatment Patient Details Name: Autumn Schneider MRN: [id number] DOB: 08-11-1940 Today's Date: 06/24/2020    History of Present Illness Autumn Schneider is a 80 y.o. female who presents by by EMS after a fall at home. Pt s/p L IM Nail due to L hip fx.  PMH: HTN, DMT2, HLD, GERD, Alzheimer's dementia    PT Comments    Pt with increased tolerance to L LE passive/AAROM today and was able to complete stand pvt transfer with L LE TWB, initiating steps to chair. Pt remains to have no awareness of deficits or situation as pt with severe dementia. Pt content in chair without grimacing or crying out in pain t/o session. Pt active with activity lap belt. Acute PT to cont to follow.    Follow Up Recommendations  SNF;Supervision/Assistance - 24 hour     Equipment Recommendations   (will need w/c at facility)    Recommendations for Other Services       Precautions / Restrictions Precautions Precautions: Fall Precaution Comments: advanced dementia, very fidgity with hands, garbled speech Restrictions Weight Bearing Restrictions: Yes LLE Weight Bearing: Non weight bearing (per Dr. Diamantina Providence not, can be TTWB for transfers only)    Mobility  Bed Mobility Overal bed mobility: Needs Assistance Bed Mobility: Supine to Sit     Supine to sit: Max assist;HOB elevated     General bed mobility comments: maxA for LE management and trunk elevation due to impaired comprehension and inability to sequence or follow commands    Transfers Overall transfer level: Needs assistance Equipment used: 2 person hand held assist (2 person lift with gait belt) Transfers: Sit to/from [name] Transfers Sit to Stand: Mod assist;+2 physical assistance Stand pivot transfers: Mod assist;+2 physical assistance       General transfer comment: pt without screaming in pain, no resistance like eval, pt attempted to take some steps  to chair without grimacing in pain. RW not used as pt has never used one and  would add more confusion  Ambulation/Gait             General Gait Details: pt unable as she is NWB on L LE and unable to comprehend how to use RW and hop on R LE   Stairs             Wheelchair Mobility    Modified Rankin (Stroke Patients Only)       Balance Overall balance assessment: Needs assistance Sitting-balance support: Feet supported;No upper extremity supported Sitting balance-Leahy Scale: Fair Sitting balance - Comments: after place in optimal position with feet on the floor and with activity lap belt at EOB pt able to maintain EOB balance with close min guard, pt with posterior lean initially   Standing balance support: Bilateral upper extremity supported Standing balance-Leahy Scale: Zero Standing balance comment: dependent on physical assist                            Cognition Arousal/Alertness: Awake/alert Behavior During Therapy: Flat affect Overall Cognitive Status: History of cognitive impairments - at baseline                                 General Comments: pt with advanced dementia, only oriented to self, unable to follow commands consistently due to impaired comprehension. very fidgity wiht hands, liked the buttons on the actively lap belt, able to complete zipper  with verbal cues      Exercises General Exercises - Lower Extremity Ankle Circles/Pumps: AAROM;Both;10 reps;Supine Quad Sets: AAROM;Left;10 reps;Supine Long Arc Quad: AAROM;Left;10 reps;Seated Heel Slides: AAROM;Left;10 reps;Supine (AA into hip/knee flexion)    General Comments General comments (skin integrity, edema, etc.): VSS, L LE dressing intact      Pertinent Vitals/Pain Pain Assessment: Faces Faces Pain Scale: Hurts a little bit Pain Location: L LE with ROM into hip/knee flexion Pain Descriptors / Indicators: Grimacing;Guarding Pain Intervention(s): Monitored during session    Home Living                      Prior Function             PT Goals (current goals can now be found in the care plan section) Progress towards PT goals: Progressing toward goals    Frequency           PT Plan Current plan remains appropriate    Co-evaluation              AM-PAC PT "6 Clicks" Mobility   Outcome Measure  Help needed turning from your back to your side while in a flat bed without using bedrails?: Total Help needed moving from lying on your back to sitting on the side of a flat bed without using bedrails?: Total Help needed moving to and from a bed to a chair (including a wheelchair)?: Total Help needed standing up from a chair using your arms (e.g., wheelchair or bedside chair)?: Total Help needed to walk in hospital room?: Total Help needed climbing 3-5 steps with a railing? : Total 6 Click Score: 6    End of Session Equipment Utilized During Treatment: Gait belt Activity Tolerance: Patient tolerated treatment well Patient left: in chair;with call bell/phone within reach;with chair alarm set;with nursing/sitter in room;with family/visitor present Nurse Communication: Mobility status PT Visit Diagnosis: Unsteadiness on feet (R26.81);Muscle weakness (generalized) (M62.81);Difficulty in walking, not elsewhere classified (R26.2);Other symptoms and signs involving the nervous system (R29.898)     Time: 0902-0920 PT Time Calculation (min) (ACUTE ONLY): 18 min  Charges:  $Therapeutic Activity: 8-22 mins                     Lewis Shock, PT, DPT Acute Rehabilitation Services Pager #: [phone] Office #: [phone]    Iona Hansen 06/24/2020, 10:49 AM

## 2020-06-25 LAB — CBC
HCT: 35.4 % — ABNORMAL LOW (ref 36.0–46.0)
Hemoglobin: 11.9 g/dL — ABNORMAL LOW (ref 12.0–15.0)
MCH: 30.2 pg (ref 26.0–34.0)
MCHC: 33.6 g/dL (ref 30.0–36.0)
MCV: 89.8 fL (ref 80.0–100.0)
Platelets: 276 10*3/uL (ref 150–400)
RBC: 3.94 MIL/uL (ref 3.87–5.11)
RDW: 12.9 % (ref 11.5–15.5)
WBC: 9.6 10*3/uL (ref 4.0–10.5)
nRBC: 0 % (ref 0.0–0.2)

## 2020-06-25 LAB — BASIC METABOLIC PANEL
Anion gap: 8 (ref 5–15)
BUN: 31 mg/dL — ABNORMAL HIGH (ref 8–23)
CO2: 27 mmol/L (ref 22–32)
Calcium: 8.5 mg/dL — ABNORMAL LOW (ref 8.9–10.3)
Chloride: 101 mmol/L (ref 98–111)
Creatinine, Ser: 0.94 mg/dL (ref 0.44–1.00)
GFR, Estimated: >60 mL/min (ref >60)
Glucose, Bld: 198 mg/dL — ABNORMAL HIGH (ref 70–99)
Potassium: 4 mmol/L (ref 3.5–5.1)
Sodium: 136 mmol/L (ref 135–145)

## 2020-06-25 LAB — GLUCOSE, CAPILLARY
Glucose-Capillary: 182 mg/dL — ABNORMAL HIGH (ref 70–99)
Glucose-Capillary: 239 mg/dL — ABNORMAL HIGH (ref 70–99)
Glucose-Capillary: 209 mg/dL — ABNORMAL HIGH (ref 70–99)

## 2020-06-25 LAB — RESP PANEL BY RT-PCR (FLU A&B, COVID) ARPGX2
Influenza A by PCR: NEGATIVE
Influenza B by PCR: NEGATIVE
SARS Coronavirus 2 by RT PCR: NEGATIVE

## 2020-06-25 MED ORDER — ASPIRIN 81 MG PO TBEC: 81.0000 mg | DELAYED_RELEASE_TABLET | Freq: Every day | ORAL | Status: AC

## 2020-06-25 MED ORDER — ADULT MULTIVITAMIN W/MINERALS CH: 1.0000 | ORAL_TABLET | Freq: Every day | ORAL | Status: AC

## 2020-06-25 MED ORDER — DOCUSATE SODIUM 100 MG PO CAPS: 100.0000 mg | ORAL_CAPSULE | Freq: Two times a day (BID) | ORAL | 0 refills | Status: AC

## 2020-06-25 MED ORDER — HYDROCODONE-ACETAMINOPHEN 5-325 MG PO TABS: 1.0000 | ORAL_TABLET | Freq: Four times a day (QID) | ORAL | 0 refills | Status: AC | PRN

## 2020-06-25 NOTE — Progress Notes (Signed)
Attempted twice to call report to [facility], receiving facility. No one ever answered once transferred by the operator. Let my contact number with the operator for the receiving RN to call me back for report.   EMS transported patient before receiving patient's AVS.    Will await call from the facility.

## 2020-06-25 NOTE — Plan of Care (Signed)
  Problem: Education: Goal: Knowledge of General Education information will improve Description: Including pain rating scale, medication(s)/side effects and non-pharmacologic comfort measures Outcome: Adequate for Discharge   Problem: Health Behavior/Discharge Planning: Goal: Ability to manage health-related needs will improve Outcome: Adequate for Discharge   Problem: Clinical Measurements: Goal: Ability to maintain clinical measurements within normal limits will improve Outcome: Adequate for Discharge   Problem: Clinical Measurements: Goal: Will remain free from infection Outcome: Adequate for Discharge   Problem: Clinical Measurements: Goal: Respiratory complications will improve Outcome: Adequate for Discharge   Problem: Clinical Measurements: Goal: Diagnostic test results will improve Outcome: Adequate for Discharge   Problem: Clinical Measurements: Goal: Cardiovascular complication will be avoided Outcome: Adequate for Discharge

## 2020-06-25 NOTE — TOC Transition Note (Signed)
Transition of Care [facility]) - CM/SW Discharge Note   Patient Details  Name: Loray Duval MRN: [id number] Date of Birth: 02/04/1940  [facility] CM/SW Contact:  Epifanio Lesches, RN Phone Number: 06/25/2020, 3:32 PM   Clinical Narrative:    Patient will DC to: [facility] Anticipated DC date: 06/25/2020 Family notified: yes Transport by: Sharin Mons   Per MD patient ready for DC today. RN, patient, patient's family, and facility notified of DC. Discharge Summary and FL2 sent to facility. RN to call report prior to discharge [phone]). DC packet on chart. Ambulance transport requested for patient.   RNCM will sign off for now as intervention is no longer needed. Please consult [place] again if new needs arise.    Final next level of care: Skilled Nursing Facility Barriers to Discharge: No Barriers Identified   Patient Goals and CMS Choice   CMS Medicare.gov Compare Post Acute Care list provided to:: Patient Represenative (must comment) Choice offered to / list presented to : Spouse  Discharge Placement                       Discharge Plan and Services                                     Social Determinants of Health (SDOH) Interventions     Readmission Risk Interventions No flowsheet data found.

## 2020-06-25 NOTE — Progress Notes (Signed)
Patient stable. No changes.  Plan for discharge to SNF today. Dressings intact. No calf tenderness. Prescription for opioid pain medication placed in patient's chart. Plan for NWB operative extremity with walker and follow-up with Dr. August Saucer in ~10 days.

## 2020-06-25 NOTE — Discharge Summary (Signed)
Physician Discharge Summary  Autumn Schneider ZOX:[id number] DOB: Jun 28, 1940 DOA: 06/20/2020  PCP: Merri Brunette, MD  Admit date: 06/20/2020 Discharge date: 06/25/2020  Admitted From: Home Disposition: SNF  Recommendations for Outpatient Follow-up:  Follow up with PCP in 1 week Orthopedic surgery follow-up in 2 weeks Please obtain BMP/CBC in one week Please follow up on the following pending results: None  Discharge Condition: Stale CODE STATUS: DNR Diet recommendation: Regular diet, carb modified   Brief/Interim Summary:  Admission HPI written by Carlton Adam, MD   Chief Complaint: Fall at home, left hip pain   HPI: Autumn Schneider is a 80 y.o. female with medical history significant for HTN, DMT2, HLD, GERD, Alzheimer's dementia by EMS after a fall at home.  Patient was walking to her bedroom after dinner and as she was going down the hallway and stopped to turn to go into her room she lost her balance and fell landing on her left side.  She immediately yelled out in pain.  Her husband son and grandson were in the house but did not directly witness her fall but heard her hit the ground and yell.  She had no loss of consciousness and no signs of head injury.  No complaints of chest pain, abdominal pain or shortness of breath prior to the fall.  She did have an episode of vomiting in the ambulance after she was given fentanyl.  She has had no vomiting while in the emergency room.  She is unable to provide any history herself and her husband is at the bedside and provides history.  She has not had a recent change in her medications according to the husband.   Hospital course:  Left hip fracture Secondary to fall. No evidence of syncope. Orthopedic surgery consulted and performed IM nail placement on 6/5 with recommendations for nonweightbearing of left lower extremity. PT/OT recommending SNF with wheelchair at the facility.   Acute anemia Acute blood loss anemia Post-op hemoglobin of  5.2 requiring transfusion of a total 2 units of PRBC. Currently stable. Hemoglobin of 11.9 on discharge.   Primary hypertension Mostly controlled. Elevated overnight. Continue amlodipine 10 mg daily.   Diabetes mellitus, type 2 Hemoglobin A1C of 6.5%. Patient is on metformin as an outpatient. Continue metformin on discharge.   Hyperlipidemia Continue Lipitor   CKD stage IIIa Stable   Alzheimer's dementia Stable. Moderate-advanced.  Discharge Diagnoses:  Principal Problem:   Closed hip fracture, left, initial encounter [facility]) Active Problems:   Hypertension   Alzheimer's dementia (HCC)   Diabetes mellitus type 2, controlled (HCC)   Anemia   CKD (chronic kidney disease) stage 3, GFR 30-59 ml/min (HCC)    Discharge Instructions   Allergies as of 06/25/2020       Reactions   Aricept [donepezil Hcl]    Doesn't remember   Erythromycin Hives        Medication List     STOP taking these medications    LORazepam 0.5 MG tablet Commonly known as: ATIVAN       TAKE these medications    Accu-Chek SmartView test strip Generic drug: glucose blood   amLODipine 10 MG tablet Commonly known as: NORVASC Take 10 mg by mouth daily.   aspirin 81 MG EC tablet Take 1 tablet (81 mg total) by mouth daily. Take for 30 days for VTE prophylaxis Start taking on: Alantis 10, 2022 What changed: additional instructions   atorvastatin 80 MG tablet Commonly known as: Lipitor Take 1 tablet (80  mg total) by mouth daily.   docusate sodium 100 MG capsule Commonly known as: COLACE Take 1 capsule (100 mg total) by mouth 2 (two) times daily.   feeding supplement Liqd Take 237 mLs by mouth 2 (two) times daily between meals.   HYDROcodone-acetaminophen 5-325 MG tablet Commonly known as: NORCO/VICODIN Take 1 tablet by mouth every 6 (six) hours as needed.   Melatonin 10 MG Tabs Take 2 tablets by mouth at bedtime.   metFORMIN 500 MG tablet Commonly known as: GLUCOPHAGE Take 1,000 mg  by mouth 2 (two) times daily.   mirtazapine 30 MG tablet Commonly known as: REMERON Take 30 mg by mouth at bedtime.   multivitamin with minerals Tabs tablet Take 1 tablet by mouth daily.   pantoprazole 40 MG tablet Commonly known as: PROTONIX Take 40 mg by mouth daily.   senna 8.6 MG Tabs tablet Commonly known as: SENOKOT Take 2 tablets by mouth 2 (two) times daily.        Contact information for after-discharge care     Destination     [facility] Preferred SNF .   Service: Skilled Nursing Contact information: [address] [place] [address] [phone]                    Allergies  Allergen Reactions   Aricept [Donepezil Hcl]     Doesn't remember   Erythromycin Hives    Consultations: Orthopedic surgery   Procedures/Studies: DG Chest 1 View  Result Date: 06/20/2020 CLINICAL DATA:  Status post fall.  LEFT hip pain. EXAM: CHEST  1 VIEW COMPARISON:  Chest x-ray dated 11/03/2014. FINDINGS: Heart size and mediastinal contours are stable. Lungs are clear. No pleural effusion or pneumothorax is seen. Osseous structures about the chest are unremarkable. IMPRESSION: No active disease. No evidence of pneumonia or pulmonary edema. Electronically Signed   By: Bary Richard M.D.   On: 06/20/2020 22:22   CT ABDOMEN PELVIS W CONTRAST  Result Date: 06/21/2020 CLINICAL DATA:  Fall, left femoral fracture, severe acute anemia. Assess for active extravasation. EXAM: CT ABDOMEN AND PELVIS WITH CONTRAST TECHNIQUE: Multidetector CT imaging of the abdomen and pelvis was performed using the standard protocol following bolus administration of intravenous contrast. CONTRAST:  OMNIPAQUE IOHEXOL 300 MG/ML  SOLN COMPARISON:  04/10/2020 FINDINGS: Lower chest: Mild bibasilar atelectasis. The visualized heart and pericardium are unremarkable. Hepatobiliary: Mild hepatic steatosis. No enhancing intrahepatic mass. No intra or extrahepatic biliary  ductal dilation. Gallbladder unremarkable. Pancreas: Unremarkable Spleen: 7 mm enhancing lesion within the inferior pole of the spleen is unchanged possibly representing a flash fill hemangioma or an intraparenchymal aneurysm or hyperdense cyst. The spleen is otherwise unremarkable. No perisplenic fluid collections are identified. Adrenals/Urinary Tract: The adrenal glands are unremarkable. The kidneys are normal in size and position. Simple cortical cyst within the upper pole the left kidney is again noted. No intrarenal or ureteral calculi. No hydronephrosis. The bladder is unremarkable. Stomach/Bowel: There is moderate stool noted throughout the colon and rectal vault without evidence of obstruction. The stomach, small bowel, and large bowel are otherwise unremarkable. Appendix normal. No free intraperitoneal gas or fluid. Vascular/Lymphatic: Extensive aortoiliac atherosclerotic calcification is present. Particularly prominent atherosclerotic calcification is seen at the origin of the a right renal artery resulting in a roughly 50% stenosis of this vessel at its origin. Calcified plaque results in less than 50% stenoses of the superior mesenteric artery and left renal arteries at their origins. 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 No aortic aneurysm. No pathologic adenopathy within the abdomen and pelvis. Reproductive: Uterus and bilateral adnexa are unremarkable. Other: No retroperitoneal hematoma identified. Musculoskeletal: In acute, comminuted left femoral intratrochanteric fracture is again identified with avulsion of the lesser trochanter and impaction and varus angulation of the major fracture fragments. No dislocation. No active extravasation within the soft tissues surrounding the fracture fragments. There is intrafascial infiltration in keeping with edema surrounding the fracture fragments. The osseous structures are otherwise intact. No lytic or  blastic bone lesion. IMPRESSION: No definite radiographic stores for the patient's severe anemia. No active extravasation identified within the abdomen and pelvis and surrounding the left femoral fracture fragments. No acute intra-abdominal pathology identified. Mild hepatic steatosis. Stable 7 mm enhancing lesion within the inferior pole of the spleen possibly representing a flash filled hemangioma or intraparenchymal aneurysm. Moderate stool throughout the colon and rectal vault. Peripheral vascular disease with roughly 50% stenosis of the a right renal artery at its origin. Acute, impacted, angulated, comminuted left femoral intratrochanteric fracture. Aortic Atherosclerosis (ICD10-I70.0). Electronically Signed   By: Helyn NumbersAshesh  Parikh MD   On: 06/21/2020 03:06   Pelvis Portable  Result Date: 06/21/2020 CLINICAL DATA:  LEFT hip fracture. EXAM: PORTABLE PELVIS 1-2 VIEWS COMPARISON:  Plain film of the pelvis dated 06/20/2020. FINDINGS: Interval internal fixation of the LEFT femoral neck fracture. Hardware appears intact and appropriately positioned. Osseous alignment is improved. Expected postsurgical changes within the overlying soft tissues. IMPRESSION: Status post internal fixation of the LEFT femoral neck fracture. No evidence of surgical complicating feature. Electronically Signed   By: Bary RichardStan  Maynard M.D.   On: 06/21/2020 16:05   DG C-Arm 1-60 Min  Result Date: 06/21/2020 CLINICAL DATA:  Nailing of LEFT femur fracture EXAM: LEFT FEMUR 2 VIEWS; DG C-ARM 1-60 MIN COMPARISON:  06/20/2020 FLUOROSCOPY TIME:  0 minutes 47 seconds Images: 3 FINDINGS: Osseous demineralization. Images demonstrate placement of an IM nail with 2 compression screws across previously identified intertrochanteric fracture LEFT femur. No dislocation. IMPRESSION: Post nailing of intertrochanteric fracture LEFT femur. Electronically Signed   By: Ulyses SouthwardMark  Boles M.D.   On: 06/21/2020 15:00   DG Hip Unilat With Pelvis 2-3 Views Left  Result  Date: 06/20/2020 CLINICAL DATA:  Status post mechanical fall.  Pain to LEFT hip. EXAM: DG HIP (WITH OR WITHOUT PELVIS) 2-3V LEFT COMPARISON:  None. FINDINGS: Significantly displaced/comminuted fracture of the LEFT femoral neck, with avulsion of the lesser trochanter. LEFT femoral head remains grossly well aligned relative to the acetabulum. No additional fracture is seen within the osseous pelvis. IMPRESSION: Significantly displaced/comminuted fracture of the LEFT femoral neck, with avulsion of the lesser trochanter. Electronically Signed   By: Bary RichardStan  Maynard M.D.   On: 06/20/2020 22:22   DG FEMUR MIN 2 VIEWS LEFT  Result Date: 06/21/2020 CLINICAL DATA:  Nailing of LEFT femur fracture EXAM: LEFT FEMUR 2 VIEWS; DG C-ARM 1-60 MIN COMPARISON:  06/20/2020 FLUOROSCOPY TIME:  0 minutes 47 seconds Images: 3 FINDINGS: Osseous demineralization. Images demonstrate placement of an IM nail with 2 compression screws across previously identified intertrochanteric fracture LEFT femur. No dislocation. IMPRESSION: Post nailing of intertrochanteric fracture LEFT femur. Electronically Signed   By: Ulyses SouthwardMark  Boles M.D.   On: 06/21/2020 15:00     Subjective: No issues overnight.  Discharge Exam: Vitals:   06/25/20 0607 06/25/20 0824  BP: (!) 156/92 136/67  Pulse: 77 87  Resp: 18 17  Temp: 98.6 F (37 C) 98.6  F (37 C)  SpO2: 99% 98%   Vitals:   06/24/20 1500 06/24/20 2231 06/25/20 0607 06/25/20 0824  BP: 131/74 (!) 157/80 (!) 156/92 136/67  Pulse: 98 90 77 87  Resp: Temp: 98.6 F (37 C) 98.6 F (37 C) 98.6 F (37 C) 98.6 F (37 C)  TempSrc: Axillary Oral Axillary Oral  SpO2: 98% 98% 99% 98%    General: Pt is alert, awake, not in acute distress Cardiovascular: RRR, S1/S2 +, no rubs, no gallops Respiratory: CTA bilaterally, no wheezing, no rhonchi Abdominal: Soft, NT, ND, bowel sounds + Extremities: no edema, no cyanosis    The results of significant diagnostics from this hospitalization  (including imaging, microbiology, ancillary and laboratory) are listed below for reference.     Microbiology: Recent Results (from the past 240 hour(s))  Resp Panel by RT-PCR (Flu A&B, Covid) Nasopharyngeal Swab     Status: None   Collection Time: 06/21/20 12:10 AM   Specimen: Nasopharyngeal Swab; Nasopharyngeal(NP) swabs in vial transport medium  Result Value Ref Range Status   SARS Coronavirus 2 by RT PCR NEGATIVE NEGATIVE Final    Comment: (NOTE) SARS-CoV-2 target nucleic acids are NOT DETECTED.  The SARS-CoV-2 RNA is generally detectable in upper respiratory specimens during the acute phase of infection. The lowest concentration of SARS-CoV-2 viral copies this assay can detect is 138 copies/mL. A negative result does not preclude SARS-Cov-2 infection and should not be used as the sole basis for treatment or other patient management decisions. A negative result may occur with  improper specimen collection/handling, submission of specimen other than nasopharyngeal swab, presence of viral mutation(s) within the areas targeted by this assay, and inadequate number of viral copies(<138 copies/mL). A negative result must be combined with clinical observations, patient history, and epidemiological information. The expected result is Negative.  Fact Sheet for Patients:  BloggerCourse.com  Fact Sheet for Healthcare Providers:  SeriousBroker.it  This test is no t yet approved or cleared by the [place] FDA and  has been authorized for detection and/or diagnosis of SARS-CoV-2 by FDA under an Emergency Use Authorization (EUA). This EUA will remain  in effect (meaning this test can be used) for the duration of the COVID-19 declaration under Section 564(b)(1) of the Act, 21 U.S.C.section 360bbb-3(b)(1), unless the authorization is terminated  or revoked sooner.       Influenza A by PCR NEGATIVE NEGATIVE Final   Influenza B by PCR  NEGATIVE NEGATIVE Final    Comment: (NOTE) The Xpert Xpress SARS-CoV-2/FLU/RSV plus assay is intended as an aid in the diagnosis of influenza from Nasopharyngeal swab specimens and should not be used as a sole basis for treatment. Nasal washings and aspirates are unacceptable for Xpert Xpress SARS-CoV-2/FLU/RSV testing.  Fact Sheet for Patients: BloggerCourse.com  Fact Sheet for Healthcare Providers: SeriousBroker.it  This test is not yet approved or cleared by the [place] FDA and has been authorized for detection and/or diagnosis of SARS-CoV-2 by FDA under an Emergency Use Authorization (EUA). This EUA will remain in effect (meaning this test can be used) for the duration of the COVID-19 declaration under Section 564(b)(1) of the Act, 21 U.S.C. section 360bbb-3(b)(1), unless the authorization is terminated or revoked.  Performed at [facility], [address], [place], [place] [address]   Surgical pcr screen     Status: None   Collection Time: 06/21/20  6:04 AM   Specimen: Nasal Mucosa; Nasal Swab  Result Value Ref Range Status  MRSA, PCR NEGATIVE NEGATIVE Final   Staphylococcus aureus NEGATIVE NEGATIVE Final    Comment: (NOTE) The Xpert SA Assay (FDA approved for NASAL specimens in patients 64 years of age and older), is one component of a comprehensive surveillance program. It is not intended to diagnose infection nor to guide or monitor treatment. Performed at [facility], [address], [place], [place] [address]   Resp Panel by RT-PCR (Flu A&B, Covid) Nasopharyngeal Swab     Status: None   Collection Time: 06/25/20  9:30 AM   Specimen: Nasopharyngeal Swab; Nasopharyngeal(NP) swabs in vial transport medium  Result Value Ref Range Status   SARS Coronavirus 2 by RT PCR NEGATIVE NEGATIVE Final    Comment: (NOTE) SARS-CoV-2 target nucleic acids are NOT DETECTED.  The SARS-CoV-2 RNA is generally  detectable in upper respiratory specimens during the acute phase of infection. The lowest concentration of SARS-CoV-2 viral copies this assay can detect is 138 copies/mL. A negative result does not preclude SARS-Cov-2 infection and should not be used as the sole basis for treatment or other patient management decisions. A negative result may occur with  improper specimen collection/handling, submission of specimen other than nasopharyngeal swab, presence of viral mutation(s) within the areas targeted by this assay, and inadequate number of viral copies(<138 copies/mL). A negative result must be combined with clinical observations, patient history, and epidemiological information. The expected result is Negative.  Fact Sheet for Patients:  BloggerCourse.com  Fact Sheet for Healthcare Providers:  SeriousBroker.it  This test is no t yet approved or cleared by the [place] FDA and  has been authorized for detection and/or diagnosis of SARS-CoV-2 by FDA under an Emergency Use Authorization (EUA). This EUA will remain  in effect (meaning this test can be used) for the duration of the COVID-19 declaration under Section 564(b)(1) of the Act, 21 U.S.C.section 360bbb-3(b)(1), unless the authorization is terminated  or revoked sooner.       Influenza A by PCR NEGATIVE NEGATIVE Final   Influenza B by PCR NEGATIVE NEGATIVE Final    Comment: (NOTE) The Xpert Xpress SARS-CoV-2/FLU/RSV plus assay is intended as an aid in the diagnosis of influenza from Nasopharyngeal swab specimens and should not be used as a sole basis for treatment. Nasal washings and aspirates are unacceptable for Xpert Xpress SARS-CoV-2/FLU/RSV testing.  Fact Sheet for Patients: BloggerCourse.com  Fact Sheet for Healthcare Providers: SeriousBroker.it  This test is not yet approved or cleared by the [place] FDA  and has been authorized for detection and/or diagnosis of SARS-CoV-2 by FDA under an Emergency Use Authorization (EUA). This EUA will remain in effect (meaning this test can be used) for the duration of the COVID-19 declaration under Section 564(b)(1) of the Act, 21 U.S.C. section 360bbb-3(b)(1), unless the authorization is terminated or revoked.  Performed at [facility], [address] [place], [place] [address]      Labs: BNP (last 3 results) No results for input(s): BNP in the last 8760 hours. Basic Metabolic Panel: Recent Labs  Lab 06/21/20 0118 06/22/20 0222 06/23/20 0210 06/24/20 0428 06/25/20 0120  NA 138 136 135 135 136  K 5.3* 4.4 3.4* 3.7 4.0  CL 107 102 100 102 101  CO2 28 26 28 25 27   GLUCOSE 181* 121* 137* 155* 198*  BUN 31* 19 16 23  31*  CREATININE 1.13* 0.95 0.89 0.91 0.94  CALCIUM 9.1 8.4* 8.7* 8.3* 8.5*   Liver Function Tests: No results for input(s): AST, ALT, ALKPHOS,  BILITOT, PROT, ALBUMIN in the last 168 hours. No results for input(s): LIPASE, AMYLASE in the last 168 hours. No results for input(s): AMMONIA in the last 168 hours. CBC: Recent Labs  Lab 06/21/20 0020 06/21/20 0118 06/21/20 0731 06/22/20 0222 06/23/20 0210 06/24/20 0428 06/25/20 0120  WBC 5.9  --  9.6 9.0 9.5 9.2 9.6  NEUTROABS 5.2  --   --   --   --   --   --   HGB 5.2*   < > 13.0 11.9* 12.8 11.8* 11.9*  HCT 17.1*   < > 38.0 35.5* 37.5 35.3* 35.4*  MCV 100.6*  --  89.0 89.9 89.1 89.6 89.8  PLT 131*  --  211 206 191 200 276   < > = values in this interval not displayed.   Cardiac Enzymes: No results for input(s): CKTOTAL, CKMB, CKMBINDEX, TROPONINI in the last 168 hours. BNP: Invalid input(s): POCBNP CBG: Recent Labs  Lab 06/24/20 1131 06/24/20 1625 06/24/20 2211 06/25/20 0621 06/25/20 1141  GLUCAP 273* 218* 178* 182* 239*   D-Dimer No results for input(s): DDIMER in the last 72 hours. Hgb A1c No results for input(s): HGBA1C in the last 72 hours. Lipid  Profile No results for input(s): CHOL, HDL, LDLCALC, TRIG, CHOLHDL, LDLDIRECT in the last 72 hours. Thyroid function studies No results for input(s): TSH, T4TOTAL, T3FREE, THYROIDAB in the last 72 hours.  Invalid input(s): FREET3 Anemia work up No results for input(s): VITAMINB12, FOLATE, FERRITIN, TIBC, IRON, RETICCTPCT in the last 72 hours. Urinalysis    Component Value Date/Time   COLORURINE YELLOW 04/10/2020 2047   APPEARANCEUR CLEAR 04/10/2020 2047   LABSPEC 1.024 04/10/2020 2047   PHURINE 5.0 04/10/2020 2047   GLUCOSEU 50 (A) 04/10/2020 2047   HGBUR NEGATIVE 04/10/2020 2047   BILIRUBINUR NEGATIVE 04/10/2020 2047   KETONESUR NEGATIVE 04/10/2020 2047   PROTEINUR 30 (A) 04/10/2020 2047   UROBILINOGEN 0.2 11/03/2014 1243   NITRITE POSITIVE (A) 04/10/2020 2047   LEUKOCYTESUR SMALL (A) 04/10/2020 2047   Sepsis Labs Invalid input(s): PROCALCITONIN,  WBC,  LACTICIDVEN Microbiology Recent Results (from the past 240 hour(s))  Resp Panel by RT-PCR (Flu A&B, Covid) Nasopharyngeal Swab     Status: None   Collection Time: 06/21/20 12:10 AM   Specimen: Nasopharyngeal Swab; Nasopharyngeal(NP) swabs in vial transport medium  Result Value Ref Range Status   SARS Coronavirus 2 by RT PCR NEGATIVE NEGATIVE Final    Comment: (NOTE) SARS-CoV-2 target nucleic acids are NOT DETECTED.  The SARS-CoV-2 RNA is generally detectable in upper respiratory specimens during the acute phase of infection. The lowest concentration of SARS-CoV-2 viral copies this assay can detect is 138 copies/mL. A negative result does not preclude SARS-Cov-2 infection and should not be used as the sole basis for treatment or other patient management decisions. A negative result may occur with  improper specimen collection/handling, submission of specimen other than nasopharyngeal swab, presence of viral mutation(s) within the areas targeted by this assay, and inadequate number of viral copies(<138 copies/mL). A  negative result must be combined with clinical observations, patient history, and epidemiological information. The expected result is Negative.  Fact Sheet for Patients:  BloggerCourse.com  Fact Sheet for Healthcare Providers:  SeriousBroker.it  This test is no t yet approved or cleared by the [place] FDA and  has been authorized for detection and/or diagnosis of SARS-CoV-2 by FDA under an Emergency Use Authorization (EUA). This EUA will remain  in effect (meaning this test can be used) for the  duration of the COVID-19 declaration under Section 564(b)(1) of the Act, 21 U.S.C.section 360bbb-3(b)(1), unless the authorization is terminated  or revoked sooner.       Influenza A by PCR NEGATIVE NEGATIVE Final   Influenza B by PCR NEGATIVE NEGATIVE Final    Comment: (NOTE) The Xpert Xpress SARS-CoV-2/FLU/RSV plus assay is intended as an aid in the diagnosis of influenza from Nasopharyngeal swab specimens and should not be used as a sole basis for treatment. Nasal washings and aspirates are unacceptable for Xpert Xpress SARS-CoV-2/FLU/RSV testing.  Fact Sheet for Patients: BloggerCourse.com  Fact Sheet for Healthcare Providers: SeriousBroker.it  This test is not yet approved or cleared by the [place] FDA and has been authorized for detection and/or diagnosis of SARS-CoV-2 by FDA under an Emergency Use Authorization (EUA). This EUA will remain in effect (meaning this test can be used) for the duration of the COVID-19 declaration under Section 564(b)(1) of the Act, 21 U.S.C. section 360bbb-3(b)(1), unless the authorization is terminated or revoked.  Performed at [facility], [address] [place], [place] [address]   Surgical pcr screen     Status: None   Collection Time: 06/21/20  6:04 AM   Specimen: Nasal Mucosa; Nasal Swab  Result Value Ref Range Status    MRSA, PCR NEGATIVE NEGATIVE Final   Staphylococcus aureus NEGATIVE NEGATIVE Final    Comment: (NOTE) The Xpert SA Assay (FDA approved for NASAL specimens in patients 88 years of age and older), is one component of a comprehensive surveillance program. It is not intended to diagnose infection nor to guide or monitor treatment. Performed at [facility], [address] [place], [place] [address]   Resp Panel by RT-PCR (Flu A&B, Covid) Nasopharyngeal Swab     Status: None   Collection Time: 06/25/20  9:30 AM   Specimen: Nasopharyngeal Swab; Nasopharyngeal(NP) swabs in vial transport medium  Result Value Ref Range Status   SARS Coronavirus 2 by RT PCR NEGATIVE NEGATIVE Final    Comment: (NOTE) SARS-CoV-2 target nucleic acids are NOT DETECTED.  The SARS-CoV-2 RNA is generally detectable in upper respiratory specimens during the acute phase of infection. The lowest concentration of SARS-CoV-2 viral copies this assay can detect is 138 copies/mL. A negative result does not preclude SARS-Cov-2 infection and should not be used as the sole basis for treatment or other patient management decisions. A negative result may occur with  improper specimen collection/handling, submission of specimen other than nasopharyngeal swab, presence of viral mutation(s) within the areas targeted by this assay, and inadequate number of viral copies(<138 copies/mL). A negative result must be combined with clinical observations, patient history, and epidemiological information. The expected result is Negative.  Fact Sheet for Patients:  BloggerCourse.com  Fact Sheet for Healthcare Providers:  SeriousBroker.it  This test is no t yet approved or cleared by the [place] FDA and  has been authorized for detection and/or diagnosis of SARS-CoV-2 by FDA under an Emergency Use Authorization (EUA). This EUA will remain  in effect (meaning this test can be  used) for the duration of the COVID-19 declaration under Section 564(b)(1) of the Act, 21 U.S.C.section 360bbb-3(b)(1), unless the authorization is terminated  or revoked sooner.       Influenza A by PCR NEGATIVE NEGATIVE Final   Influenza B by PCR NEGATIVE NEGATIVE Final    Comment: (NOTE) The Xpert Xpress SARS-CoV-2/FLU/RSV plus assay is intended as an aid in the diagnosis of influenza from Nasopharyngeal swab specimens and  should not be used as a sole basis for treatment. Nasal washings and aspirates are unacceptable for Xpert Xpress SARS-CoV-2/FLU/RSV testing.  Fact Sheet for Patients: BloggerCourse.com  Fact Sheet for Healthcare Providers: SeriousBroker.it  This test is not yet approved or cleared by the [place] FDA and has been authorized for detection and/or diagnosis of SARS-CoV-2 by FDA under an Emergency Use Authorization (EUA). This EUA will remain in effect (meaning this test can be used) for the duration of the COVID-19 declaration under Section 564(b)(1) of the Act, 21 U.S.C. section 360bbb-3(b)(1), unless the authorization is terminated or revoked.  Performed at [facility], [address] [place], [place] [address]      Time coordinating discharge: 35 minutes  SIGNED:   Jacquelin Hawking, MD [facility] 06/25/2020, 3:21 PM

## 2020-07-06 ENCOUNTER — Encounter: Payer: Self-pay | Admitting: Orthopedic Surgery

## 2020-07-06 ENCOUNTER — Ambulatory Visit (INDEPENDENT_AMBULATORY_CARE_PROVIDER_SITE_OTHER): Payer: Medicare Other | Admitting: Orthopedic Surgery

## 2020-07-06 ENCOUNTER — Ambulatory Visit (INDEPENDENT_AMBULATORY_CARE_PROVIDER_SITE_OTHER): Payer: Medicare Other

## 2020-07-06 DIAGNOSIS — S72002A Fracture of unspecified part of neck of left femur, initial encounter for closed fracture: Secondary | ICD-10-CM

## 2020-07-06 NOTE — Progress Notes (Signed)
Post-Op Visit Note   Patient: Autumn Schneider           Date of Birth: 1940/09/11           MRN: [id number] Visit Date: 07/06/2020 PCP: Merri Brunette, MD   Assessment & Plan:  Chief Complaint:  Chief Complaint  Patient presents with   Left Leg - Routine Post Op    06/21/20 left IM nail   Visit Diagnoses:  1. Closed hip fracture, left, initial encounter [facility])     Plan: Patient is a 80 year old female who presents s/p left femoral IM nail for left hip fracture on 06/21/2020.  She has history of dementia.  She is currently at a skilled nursing facility receiving physical therapy.  She is nonweightbearing.  She is accompanied by her husband today who is helping to provide some history on how she is doing at the facility.  Overall she seems to be doing well with no real issues.  She has no complaints of pain daily.  She did develop a pressure wound while staying at the SNF.  This was viewed today and seems to be healing well with only some overlying erythema over the top of the left buttock and no skin or soft tissue breakdown.  On exam she has no pain with internal rotation or external rotation of the left hip.  No pain with axial loading of the hip.  No calf tenderness.  Negative Homans' sign.  She is not able to flex her left hip up but she is able to flex the right hip up.  Unsure if she is just not able to follow commands or if she is just unable to do the hip flexion at this time.  Radiographs of the left femur reviewed today with [name] and her husband.  Hardware remains in excellent position with no displacement of the hardware or fracture site compared with prior radiographs.  With patient's lack of pain and excellent radiographic studies at this time, plan to progress to weightbearing as tolerated with a walker.  She did walk without any DME prior to her fall.  Instructions provided to nursing facility.  Sutures removed and replaced Steri-Strips today.  Incisions are healing well.  Plan to  follow-up in 2 weeks for clinical recheck with Dr. August Saucer and new x-rays at that time to ensure no interval displacement of the hardware or fracture with the start of weightbearing.  They will reach out to the office if there are any concerns in the meantime.  Follow-Up Instructions: No follow-ups on file.   Orders:  Orders Placed This Encounter  Procedures   XR FEMUR MIN 2 VIEWS LEFT   No orders of the defined types were placed in this encounter.   Imaging: No results found.  PMFS History: Patient Active Problem List   Diagnosis Date Noted   Closed hip fracture, left, initial encounter (HCC) 06/21/2020   Diabetes mellitus type 2, controlled (HCC) 06/21/2020   Anemia 06/21/2020   CKD (chronic kidney disease) stage 3, GFR 30-59 ml/min (HCC) 06/21/2020   Palliative care encounter 05/18/2018   Fall 11/03/2014   Diabetes mellitus type 2, uncontrolled (HCC) 11/03/2014   Alzheimer's dementia (HCC) 11/03/2014   Depression 11/03/2014   Hypercholesteremia 11/03/2014   Elevated CK 11/03/2014   TIA (transient ischemic attack) 03/07/2011   Hyponatremia 03/07/2011   EOSINOPHILIA 03/13/2008   PRURITUS 02/21/2008   DIABETES, TYPE 2 05/17/2007   Hypertension 05/17/2007   EUSTACHIAN TUBE DYSFUNCTION 05/01/2007   LARYNGITIS, ACUTE 05/01/2007  ALLERGIC RHINITIS 05/01/2007   Past Medical History:  Diagnosis Date   Alzheimer disease (HCC)    diagnosed 12/12   Anxiety and depression    Dementia (HCC)    Diabetes mellitus    Esophageal reflux    High cholesterol    Hypertension    Hyponatremia    Migraine    Osteoporosis    SIADH (syndrome of inappropriate ADH production) (HCC)    TIA (transient ischemic attack)    Vitamin D deficiency     Family History  Problem Relation Age of Onset   Stroke Mother    COPD Mother    Diabetes Sister    Breast cancer Sister    Fibromyalgia Sister    Stroke Maternal Grandmother    CVA Maternal Grandmother    Heart attack Maternal Grandfather      Past Surgical History:  Procedure Laterality Date   CESAREAN SECTION     INTRAMEDULLARY (IM) NAIL INTERTROCHANTERIC Left 06/21/2020   Procedure: INTRAMEDULLARY (IM) NAIL INTERTROCHANTRIC;  Surgeon: Cammy Copa, MD;  Location: [facility] OR;  Service: Orthopedics;  Laterality: Left;   SHOULDER SURGERY Right 2004   Social History   Occupational History   Occupation: Retired  Tobacco Use   Smoking status: Never   Smokeless tobacco: Never  Vaping Use   Vaping Use: Never used  Substance and Sexual Activity   Alcohol use: No   Drug use: No   Sexual activity: Not on file

## 2020-07-08 ENCOUNTER — Encounter: Payer: Medicare Other | Admitting: Orthopedic Surgery

## 2020-07-09 ENCOUNTER — Other Ambulatory Visit: Payer: Self-pay

## 2020-07-09 ENCOUNTER — Non-Acute Institutional Stay: Payer: Medicare Other | Admitting: Student

## 2020-07-09 DIAGNOSIS — S31000D Unspecified open wound of lower back and pelvis without penetration into retroperitoneum, subsequent encounter: Secondary | ICD-10-CM

## 2020-07-09 DIAGNOSIS — Z515 Encounter for palliative care: Secondary | ICD-10-CM

## 2020-07-09 DIAGNOSIS — R531 Weakness: Secondary | ICD-10-CM

## 2020-07-09 DIAGNOSIS — R630 Anorexia: Secondary | ICD-10-CM

## 2020-07-09 DIAGNOSIS — G300 Alzheimer's disease with early onset: Secondary | ICD-10-CM

## 2020-07-09 DIAGNOSIS — F0280 Dementia in other diseases classified elsewhere without behavioral disturbance: Secondary | ICD-10-CM

## 2020-07-09 NOTE — Progress Notes (Signed)
[facility] Pall[facility] Consult Note Telephone: [phone]  Fax: [phone]    Date of encounter: 07/09/20 PATIENT NAME: Autumn Schneider [address] [place] [address]   [phone] (home)  DOB: [birth year] MRN: [id number] PRIMARY CARE PROVIDER:    Carol Ada, MD,  [place] [address] [place]:   Carol Ada, [place],  [place] [address] [phone]  RESPONSIBLE PARTY:    Contact Information     Name Relation Home Work Mobile   Chaudhari,Ronald Spouse [phone]     Orlena Sheldon [phone]          I met face to face with patient and family in the facility. Palliative Care was asked to follow this patient by consultation request of Dr. Nyra Capes to address advance care planning and complex medical decision making. This is a follow up visit.                                   ASSESSMENT AND PLAN / RECOMMENDATIONS:   Advance Care Planning/Goals of Care: Goals include to maximize quality of life and symptom management. Our advance care planning conversation included a discussion about:    The value and importance of advance care planning  Experiences with loved ones who have been seriously ill or have died  Exploration of personal, cultural or spiritual beliefs that might influence medical decisions  Exploration of goals of care in the event of a sudden injury or illness  CODE STATUS: DNR  Husband anticipates patient returning home once therapy completed.   Symptom Management/Plan:  Alzheimer's dementia-FAST Score 7B; ambulating short distances with therapy. Continue to reorient/redirect as needed. Monitor for falls/safety.   Generalized weakness-secondary to left hip fracture. Staff to continue assist with adl's. Continue PT/OT as directed. Monitor for falls/safety.   Decline in appetite/weight loss-current weight 90 pounds.  Continue mirtazapine 30mg  QHS. Offer foods patient enjoys; continue Glucerna BID. Recommend adding magic cup BID w/meals.   Sacral wound-stage 3 sacral wound; continue daily dressing changes per facility orders with santyl, foam dressing. Air mattress to bed.    Follow up Palliative Care Visit: Palliative care will continue to follow for complex medical decision making, advance care planning, and clarification of goals. Return in 8 weeks or prn.   This visit was coded based on medical decision making (MDM).  PPS: 30%  HOSPICE ELIGIBILITY/DIAGNOSIS: TBD  Chief Complaint: Palliative Medicine follow up visit.  HISTORY OF PRESENT ILLNESS:  Autumn Schneider is a 80 y.o. year old female  with Alzheimer's dementia, CKD 3, hyperlipidemia, hypertension, T2DM. Patient recently hospitalized 6/4-06/25/2020 due to closed eft hip fracture  s/p repair, acute blood loss anemia.  Patient currently at [facility]. She is receiving PT/OT/ST services. Husband at bedside.No pain reported per husband or staff. Patient has been stable per staff. Fair appetite endorsed. Continues on metformin for T2DM. She has a stage 3 wound to sacrum; daily dressing changes per wound care nurse. Wound measures 1.0 cm x 1.0 cm x 0.2 cm. Left hip sutures removed.   Patient received resting in bed; husband is feeding patient lunch. Patient speaks, although speech is non-sensical. Pleasant affect; she does not exhibit any signs of pain/discomfort.  History obtained from review of EMR, discussion with primary team, and interview with family, facility staff/caregiver and/or Ms. Texeira.  I reviewed available labs,  medications, imaging, studies and related documents from the EMR.  Records reviewed and summarized above.   ROS  Unable to obtain due to cognitive impairment.    Physical Exam: Current and past weights: 90 pounds Pulse 72, resp 16, sats 97% on room air Constitutional: NAD General: frail appearing, thin EYES: anicteric  sclera, lids intact, no discharge  ENMT: intact hearing, oral mucous membranes moist CV: S1S2, RRR, no LE edema Pulmonary: LCTA, no increased work of breathing, no cough Abdomen: normo-active BS + 4 quadrants, soft and non tender GU: deferred MSK: sarcopenia, moves all extremities Skin: warm and dry, left hip surgical site well approximated, clean, dry, intact Neuro: generalized weakness, A & O to person Psych: non-anxious affect, pleasant Hem/lymph/immuno: no widespread bruising   Thank you for the opportunity to participate in the care of Ms. Clancy.  The palliative care team will continue to follow. Please call our office at [phone] if we can be of additional assistance.   Ezekiel Slocumb, NP   COVID-19 PATIENT SCREENING TOOL Asked and negative response unless otherwise noted:   Have you had symptoms of covid, tested positive or been in contact with someone with symptoms/positive test in the past 5-10 days? No

## 2020-07-12 ENCOUNTER — Encounter: Payer: Self-pay | Admitting: Orthopedic Surgery

## 2020-07-22 ENCOUNTER — Ambulatory Visit (INDEPENDENT_AMBULATORY_CARE_PROVIDER_SITE_OTHER): Payer: Medicare Other

## 2020-07-22 ENCOUNTER — Encounter: Payer: Self-pay | Admitting: Orthopedic Surgery

## 2020-07-22 ENCOUNTER — Ambulatory Visit (INDEPENDENT_AMBULATORY_CARE_PROVIDER_SITE_OTHER): Payer: Medicare Other | Admitting: Orthopedic Surgery

## 2020-07-22 DIAGNOSIS — S72002A Fracture of unspecified part of neck of left femur, initial encounter for closed fracture: Secondary | ICD-10-CM

## 2020-07-22 NOTE — Progress Notes (Signed)
Post-Op Visit Note   Patient: Autumn Schneider           Date of Birth: 1940/08/10           MRN: [id number] Visit Date: 07/22/2020 PCP: Merri Brunette, MD   Assessment & Plan:  Chief Complaint:  Chief Complaint  Patient presents with   Left Leg - Routine Post Op   Visit Diagnoses:  1. Closed hip fracture, left, initial encounter [facility])     Plan: Chanelle is a patient is now a month out left femoral IM at bedtime for fracture.  She has not had too much physical therapy.  She is leaving from rehab today.  On exam no real pain with internal ex rotation of the leg.  Radiographs show healed intertrochanteric fracture.  Plan is weightbearing as tolerated with therapy this already been arranged to work with her on a walker to regain strength.  Follow-up as needed.  Follow-Up Instructions: Return if symptoms worsen or fail to improve.   Orders:  Orders Placed This Encounter  Procedures   XR FEMUR MIN 2 VIEWS LEFT   No orders of the defined types were placed in this encounter.   Imaging: XR FEMUR MIN 2 VIEWS LEFT  Result Date: 07/22/2020 AP lateral radiographs left femur reviewed.  I MHS traverses healed intertrochanteric fracture.  No hardware complication.   PMFS History: Patient Active Problem List   Diagnosis Date Noted   Closed hip fracture, left, initial encounter (HCC) 06/21/2020   Diabetes mellitus type 2, controlled (HCC) 06/21/2020   Anemia 06/21/2020   CKD (chronic kidney disease) stage 3, GFR 30-59 ml/min (HCC) 06/21/2020   Palliative care encounter 05/18/2018   Fall 11/03/2014   Diabetes mellitus type 2, uncontrolled (HCC) 11/03/2014   Alzheimer's dementia (HCC) 11/03/2014   Depression 11/03/2014   Hypercholesteremia 11/03/2014   Elevated CK 11/03/2014   TIA (transient ischemic attack) 03/07/2011   Hyponatremia 03/07/2011   EOSINOPHILIA 03/13/2008   PRURITUS 02/21/2008   DIABETES, TYPE 2 05/17/2007   Hypertension 05/17/2007   EUSTACHIAN TUBE DYSFUNCTION 05/01/2007    LARYNGITIS, ACUTE 05/01/2007   ALLERGIC RHINITIS 05/01/2007   Past Medical History:  Diagnosis Date   Alzheimer disease (HCC)    diagnosed 12/12   Anxiety and depression    Dementia (HCC)    Diabetes mellitus    Esophageal reflux    High cholesterol    Hypertension    Hyponatremia    Migraine    Osteoporosis    SIADH (syndrome of inappropriate ADH production) (HCC)    TIA (transient ischemic attack)    Vitamin D deficiency     Family History  Problem Relation Age of Onset   Stroke Mother    COPD Mother    Diabetes Sister    Breast cancer Sister    Fibromyalgia Sister    Stroke Maternal Grandmother    CVA Maternal Grandmother    Heart attack Maternal Grandfather     Past Surgical History:  Procedure Laterality Date   CESAREAN SECTION     INTRAMEDULLARY (IM) NAIL INTERTROCHANTERIC Left 06/21/2020   Procedure: INTRAMEDULLARY (IM) NAIL INTERTROCHANTRIC;  Surgeon: Cammy Copa, MD;  Location: [facility] OR;  Service: Orthopedics;  Laterality: Left;   SHOULDER SURGERY Right 2004   Social History   Occupational History   Occupation: Retired  Tobacco Use   Smoking status: Never   Smokeless tobacco: Never  Vaping Use   Vaping Use: Never used  Substance and Sexual Activity   Alcohol use:  No   Drug use: No   Sexual activity: Not on file

## 2020-08-03 ENCOUNTER — Telehealth: Payer: Self-pay

## 2020-08-03 NOTE — Telephone Encounter (Signed)
Ok for pt for wbat 2 x week for 4 w the rest not so much

## 2020-08-03 NOTE — Telephone Encounter (Signed)
Kavin Leech from [facility] called she is requesting verbal orders for skilled nursing aftercare fracture,medication management and speech therapy with a frequency of 1w1 2w2 56m1 69m1 and 2 prn back:[phone]

## 2020-08-04 NOTE — Telephone Encounter (Signed)
IC advised per note below

## 2021-10-17 DEATH — deceased

## 2022-10-18 DEATH — deceased
